# Patient Record
Sex: Female | Born: 1955
Health system: Southern US, Community
[De-identification: ages and names within clinical notes are randomized; demographics above are authoritative.]

## PROBLEM LIST (undated history)

## (undated) DIAGNOSIS — N920 Excessive and frequent menstruation with regular cycle: Secondary | ICD-10-CM

## (undated) DIAGNOSIS — I493 Ventricular premature depolarization: Secondary | ICD-10-CM

## (undated) DIAGNOSIS — E079 Disorder of thyroid, unspecified: Secondary | ICD-10-CM

## (undated) DIAGNOSIS — R000 Tachycardia, unspecified: Secondary | ICD-10-CM

## (undated) DIAGNOSIS — J189 Pneumonia, unspecified organism: Secondary | ICD-10-CM

## (undated) DIAGNOSIS — R4701 Aphasia: Secondary | ICD-10-CM

## (undated) DIAGNOSIS — J302 Other seasonal allergic rhinitis: Secondary | ICD-10-CM

## (undated) DIAGNOSIS — N951 Menopausal and female climacteric states: Secondary | ICD-10-CM

## (undated) DIAGNOSIS — G4700 Insomnia, unspecified: Secondary | ICD-10-CM

## (undated) DIAGNOSIS — I10 Essential (primary) hypertension: Secondary | ICD-10-CM

## (undated) HISTORY — DX: Tachycardia, unspecified: R00.0

## (undated) HISTORY — DX: Excessive and frequent menstruation with regular cycle: N92.0

## (undated) HISTORY — DX: Insomnia, unspecified: G47.00

## (undated) HISTORY — DX: Ventricular premature depolarization: I49.3

## (undated) HISTORY — DX: Pneumonia, unspecified organism: J18.9

## (undated) HISTORY — DX: Essential (primary) hypertension: I10

## (undated) HISTORY — DX: Menopausal and female climacteric states: N95.1

## (undated) HISTORY — DX: Aphasia: R47.01

## (undated) HISTORY — DX: Other seasonal allergic rhinitis: J30.2

## (undated) HISTORY — DX: Disorder of thyroid, unspecified: E07.9

---

## 2003-09-10 HISTORY — PX: OTHER SURGICAL HISTORY: SHX169

## 2005-02-09 HISTORY — PX: DILATION AND CURETTAGE OF UTERUS: SHX78

## 2005-11-16 ENCOUNTER — Other Ambulatory Visit: Payer: Self-pay

## 2005-11-19 ENCOUNTER — Ambulatory Visit: Payer: Self-pay

## 2006-09-14 LAB — HM PAP SMEAR

## 2008-08-23 ENCOUNTER — Encounter (HOSPITAL_COMMUNITY): Admission: RE | Admit: 2008-08-23 | Discharge: 2008-10-11 | Payer: Self-pay | Admitting: Internal Medicine

## 2013-10-24 ENCOUNTER — Emergency Department: Payer: Self-pay | Admitting: Emergency Medicine

## 2014-05-02 DIAGNOSIS — E0590 Thyrotoxicosis, unspecified without thyrotoxic crisis or storm: Secondary | ICD-10-CM | POA: Insufficient documentation

## 2014-05-02 DIAGNOSIS — F17200 Nicotine dependence, unspecified, uncomplicated: Secondary | ICD-10-CM | POA: Insufficient documentation

## 2014-07-18 ENCOUNTER — Other Ambulatory Visit: Payer: Self-pay

## 2014-07-18 MED ORDER — VERAPAMIL HCL 240 MG (CO) PO TB24: 240.0000 mg | ORAL_TABLET | Freq: Every day | ORAL | Status: DC

## 2014-07-19 ENCOUNTER — Telehealth: Payer: Self-pay | Admitting: Unknown Physician Specialty

## 2014-07-19 MED ORDER — VERAPAMIL HCL ER 240 MG PO TBCR: 240.0000 mg | EXTENDED_RELEASE_TABLET | Freq: Every day | ORAL | Status: DC

## 2014-07-19 NOTE — Telephone Encounter (Signed)
[facility] called and stated that Cozera HS has been discontinued, pharm would like to know if a substitute med can be called in. Please call the pharm about order # [id number] @ [phone]. Thanks.

## 2014-07-19 NOTE — Telephone Encounter (Signed)
I wrote a new order and sent it.  Please call the pharmacy and let them know

## 2014-10-01 ENCOUNTER — Other Ambulatory Visit: Payer: Self-pay | Admitting: Unknown Physician Specialty

## 2014-12-24 ENCOUNTER — Other Ambulatory Visit: Payer: Self-pay

## 2014-12-24 MED ORDER — ESZOPICLONE 3 MG PO TABS: 3.0000 mg | ORAL_TABLET | Freq: Every day | ORAL | Status: DC

## 2014-12-24 NOTE — Telephone Encounter (Signed)
Jasmine Camacho refilled the prescription and it was printed so I got her to sign it and it was faxed to the pharmacy.

## 2014-12-24 NOTE — Telephone Encounter (Signed)
Patient was last seen 05/11/14, practice partner number is [id number], and pharmacy is [facility].

## 2015-01-09 ENCOUNTER — Other Ambulatory Visit: Payer: Self-pay

## 2015-01-09 MED ORDER — VERAPAMIL HCL ER 240 MG PO TBCR: 240.0000 mg | EXTENDED_RELEASE_TABLET | Freq: Every day | ORAL | Status: DC

## 2015-01-09 NOTE — Telephone Encounter (Signed)
Practice partner number is [phone] and pharmacy is [facility].

## 2015-04-11 DIAGNOSIS — I493 Ventricular premature depolarization: Secondary | ICD-10-CM

## 2015-04-11 DIAGNOSIS — R4701 Aphasia: Secondary | ICD-10-CM | POA: Insufficient documentation

## 2015-04-11 DIAGNOSIS — R000 Tachycardia, unspecified: Secondary | ICD-10-CM | POA: Insufficient documentation

## 2015-04-11 DIAGNOSIS — I10 Essential (primary) hypertension: Secondary | ICD-10-CM | POA: Insufficient documentation

## 2015-04-11 DIAGNOSIS — G4700 Insomnia, unspecified: Secondary | ICD-10-CM

## 2015-04-11 DIAGNOSIS — E0590 Thyrotoxicosis, unspecified without thyrotoxic crisis or storm: Secondary | ICD-10-CM

## 2015-04-11 DIAGNOSIS — J302 Other seasonal allergic rhinitis: Secondary | ICD-10-CM

## 2015-04-11 DIAGNOSIS — N951 Menopausal and female climacteric states: Secondary | ICD-10-CM

## 2015-04-16 ENCOUNTER — Encounter: Payer: Self-pay | Admitting: Unknown Physician Specialty

## 2015-04-16 ENCOUNTER — Ambulatory Visit (INDEPENDENT_AMBULATORY_CARE_PROVIDER_SITE_OTHER): Payer: BLUE CROSS/BLUE SHIELD | Admitting: Unknown Physician Specialty

## 2015-04-16 VITALS — BP 169/85 | HR 51 | Temp 97.9°F | Ht 63.8 in | Wt 178.2 lb

## 2015-04-16 DIAGNOSIS — Z0000 Encounter for general adult medical examination without abnormal findings: Secondary | ICD-10-CM

## 2015-04-16 DIAGNOSIS — J302 Other seasonal allergic rhinitis: Secondary | ICD-10-CM

## 2015-04-16 DIAGNOSIS — I10 Essential (primary) hypertension: Secondary | ICD-10-CM

## 2015-04-16 DIAGNOSIS — E0590 Thyrotoxicosis, unspecified without thyrotoxic crisis or storm: Secondary | ICD-10-CM

## 2015-04-16 MED ORDER — ALISKIREN FUMARATE 300 MG PO TABS: 300.0000 mg | ORAL_TABLET | Freq: Every day | ORAL | Status: DC

## 2015-04-16 MED ORDER — VERAPAMIL HCL ER 240 MG PO TBCR: 240.0000 mg | EXTENDED_RELEASE_TABLET | Freq: Every day | ORAL | Status: DC

## 2015-04-16 MED ORDER — ESZOPICLONE 3 MG PO TABS: 3.0000 mg | ORAL_TABLET | Freq: Every day | ORAL | Status: DC

## 2015-04-16 NOTE — Assessment & Plan Note (Signed)
Check TSH, continue with f/u Dr. Tedd Sias

## 2015-04-16 NOTE — Assessment & Plan Note (Signed)
-   Continue Flonase  °

## 2015-04-16 NOTE — Progress Notes (Signed)
BP 169/85 mmHg  Pulse 51  Temp(Src) 97.9 F (36.6 C)  Ht 5' 3.8" (1.621 m)  Wt 178 lb 3.2 oz (80.831 kg)  BMI 30.76 kg/m2  SpO2 97%  LMP  (LMP Unknown)   Subjective:    Patient ID: Jasmine Camacho, female    DOB: 09-21-1955, 60 y.o.   MRN: [id number]  HPI: Jasmine Camacho is a 60 y.o. female  Chief Complaint  Patient presents with  . Medication Problem    pt states her verapmil used to be a 24 hr tablet but states it is now a 12 hr tablet   Hypertension States she took a decongestant in the AM Using medications without difficulty Average home BP 140/80  No problems or lightheadedness No chest pain with exertion or shortness of breath No Edema  Sinusitis This is a recurrent problem. Episode onset: 3 months. The problem has been waxing and waning since onset. There has been no fever. She is experiencing no pain. Associated symptoms include congestion. Pertinent negatives include no chills, coughing, diaphoresis, ear pain, headaches, hoarse voice, neck pain, shortness of breath, sinus pressure, sneezing, sore throat or swollen glands.  She is taking OTC Flonase  Insomnia Long term problem.  ? Related to history of aneurisms.    Hyperthyroid F/u with Dr. Tedd Sias  Relevant past medical, surgical, family and social history reviewed and updated as indicated. Interim medical history since our last visit reviewed. Allergies and medications reviewed and updated.  Review of Systems  Constitutional: Negative for chills and diaphoresis.  HENT: Positive for congestion. Negative for ear pain, hoarse voice, sinus pressure, sneezing and sore throat.   Respiratory: Negative for cough and shortness of breath.   Gastrointestinal:       Husband is concerned about that she doesn't eat much and can't lose weight.  He states her stomach is "hard as a rock."  Musculoskeletal: Negative for neck pain.  Skin: Positive for rash.       Pt with some itchy patches on skin.  She has been a  dermatologist  Neurological: Negative for headaches.    Per HPI unless specifically indicated above     Objective:    BP 169/85 mmHg  Pulse 51  Temp(Src) 97.9 F (36.6 C)  Ht 5' 3.8" (1.621 m)  Wt 178 lb 3.2 oz (80.831 kg)  BMI 30.76 kg/m2  SpO2 97%  LMP  (LMP Unknown)  Wt Readings from Last 3 Encounters:  04/16/15 178 lb 3.2 oz (80.831 kg)  05/11/14 182 lb (82.555 kg)    Physical Exam  Constitutional: She is oriented to person, place, and time. She appears well-developed and well-nourished. No distress.  HENT:  Head: Normocephalic and atraumatic.  Eyes: Conjunctivae and lids are normal. Right eye exhibits no discharge. Left eye exhibits no discharge. No scleral icterus.  Neck: Normal range of motion. Neck supple. No JVD present. Carotid bruit is not present.  Cardiovascular: Normal rate, regular rhythm and normal heart sounds.   Pulmonary/Chest: Effort normal and breath sounds normal.  Abdominal: Soft. Normal appearance and bowel sounds are normal. She exhibits mass. She exhibits no distension. There is no splenomegaly or hepatomegaly. There is tenderness. There is rebound and guarding.  Musculoskeletal: Normal range of motion.  Neurological: She is alert and oriented to person, place, and time.  Skin: Skin is warm, dry and intact. No rash noted. No pallor.  Psychiatric: She has a normal mood and affect. Her behavior is normal. Judgment and thought content normal.  Results for orders placed or performed in visit on 04/11/15  HM PAP SMEAR  Result Value Ref Range   HM Pap smear from [facility]       Assessment & Plan:   Problem List Items Addressed This Visit      Unprioritized   Hyperthyroidism    Check TSH, continue with f/u Dr. Tedd Sias      Relevant Orders   TSH   Seasonal allergic rhinitis    Continue Flonase.        Hypertension - Primary    Stable with good numbers at home.  Took an OTC decongestant, continue present medications.        Relevant Medications    verapamil (CALAN-SR) 240 MG CR tablet   aliskiren (TEKTURNA) 300 MG tablet   Other Relevant Orders   Comprehensive metabolic panel   Lipid Panel w/o Chol/HDL Ratio    Other Visit Diagnoses    Routine general medical examination at a health care facility        Relevant Orders    HIV antibody    Hepatitis C antibody         Follow up plan: Return in about 6 months (around 10/17/2015).

## 2015-04-16 NOTE — Assessment & Plan Note (Signed)
Stable with good numbers at home.  Took an OTC decongestant, continue present medications.

## 2015-04-17 ENCOUNTER — Encounter: Payer: Self-pay | Admitting: Unknown Physician Specialty

## 2015-04-17 LAB — LIPID PANEL W/O CHOL/HDL RATIO
Cholesterol, Total: 193 mg/dL (ref 100–199)
HDL: 40 mg/dL (ref >39)
LDL Calculated: 116 mg/dL — ABNORMAL HIGH (ref 0–99)
TRIGLYCERIDES: 183 mg/dL — AB (ref 0–149)
VLDL Cholesterol Cal: 37 mg/dL (ref 5–40)

## 2015-04-17 LAB — COMPREHENSIVE METABOLIC PANEL
ALBUMIN: 4.3 g/dL (ref 3.5–5.5)
ALK PHOS: 94 IU/L (ref 39–117)
ALT: 16 IU/L (ref 0–32)
AST: 14 IU/L (ref 0–40)
Albumin/Globulin Ratio: 1.7 (ref 1.1–2.5)
BUN/Creatinine Ratio: 16 (ref 9–23)
BUN: 13 mg/dL (ref 6–24)
Bilirubin Total: <0.2 mg/dL (ref 0.0–1.2)
CO2: 22 mmol/L (ref 18–29)
CREATININE: 0.82 mg/dL (ref 0.57–1.00)
Calcium: 9.6 mg/dL (ref 8.7–10.2)
Chloride: 101 mmol/L (ref 96–106)
GFR calc Af Amer: 91 mL/min/{1.73_m2} (ref >59)
GFR calc non Af Amer: 79 mL/min/{1.73_m2} (ref >59)
GLUCOSE: 89 mg/dL (ref 65–99)
Globulin, Total: 2.6 g/dL (ref 1.5–4.5)
Potassium: 5 mmol/L (ref 3.5–5.2)
Sodium: 140 mmol/L (ref 134–144)
Total Protein: 6.9 g/dL (ref 6.0–8.5)

## 2015-04-17 LAB — TSH: TSH: 1.17 u[IU]/mL (ref 0.450–4.500)

## 2015-04-17 LAB — HIV ANTIBODY (ROUTINE TESTING W REFLEX): HIV Screen 4th Generation wRfx: NONREACTIVE

## 2015-04-17 LAB — HEPATITIS C ANTIBODY: Hep C Virus Ab: <0.1 s/co ratio (ref 0.0–0.9)

## 2015-04-17 NOTE — Progress Notes (Signed)
Quick Note:  Normal labs. Patient notified by letter. ______ 

## 2015-07-05 ENCOUNTER — Other Ambulatory Visit: Payer: Self-pay | Admitting: Unknown Physician Specialty

## 2015-07-09 ENCOUNTER — Telehealth: Payer: Self-pay

## 2015-07-09 NOTE — Telephone Encounter (Signed)
Faxed rx for eszopiclone to pharmacy.

## 2015-08-14 ENCOUNTER — Telehealth: Payer: Self-pay | Admitting: Unknown Physician Specialty

## 2015-08-14 MED ORDER — VERAPAMIL HCL ER 240 MG PO TBCR: 240.0000 mg | EXTENDED_RELEASE_TABLET | Freq: Every day | ORAL | Status: DC

## 2015-08-14 NOTE — Telephone Encounter (Signed)
Routing to provider. Patient was seen in March and has appointment in September. Do I need to call and DC medication from previous pharmacy?

## 2015-08-14 NOTE — Telephone Encounter (Signed)
Pt's husband came by, insurance has changed, pt needs a refill on Verapamil sent to [facility] in [place]. Thanks.

## 2015-08-14 NOTE — Telephone Encounter (Signed)
No need.  But usually the pharmacy will transfer the prescription

## 2015-09-23 ENCOUNTER — Other Ambulatory Visit: Payer: Self-pay | Admitting: Unknown Physician Specialty

## 2015-09-23 NOTE — Telephone Encounter (Signed)
Pt needs refill on Eszopiclone 3 MG TABS sent to [facility] mail order pharmacy phone [phone] fax [phone].

## 2015-09-23 NOTE — Telephone Encounter (Signed)
Routing to provider. Pharmacy changed in chart.  

## 2015-09-24 MED ORDER — ESZOPICLONE 3 MG PO TABS: 3.0000 mg | ORAL_TABLET | Freq: Every day | ORAL | 3 refills | Status: DC

## 2015-09-24 NOTE — Telephone Encounter (Signed)
Rx was printed so I faxed it to [facility].

## 2015-10-29 ENCOUNTER — Ambulatory Visit (INDEPENDENT_AMBULATORY_CARE_PROVIDER_SITE_OTHER): Payer: Managed Care, Other (non HMO) | Admitting: Unknown Physician Specialty

## 2015-10-29 ENCOUNTER — Encounter: Payer: Self-pay | Admitting: Unknown Physician Specialty

## 2015-10-29 DIAGNOSIS — G4700 Insomnia, unspecified: Secondary | ICD-10-CM

## 2015-10-29 DIAGNOSIS — I10 Essential (primary) hypertension: Secondary | ICD-10-CM

## 2015-10-29 MED ORDER — ESZOPICLONE 3 MG PO TABS: 3.0000 mg | ORAL_TABLET | Freq: Every day | ORAL | 3 refills | Status: DC

## 2015-10-29 MED ORDER — ALISKIREN FUMARATE 300 MG PO TABS: 300.0000 mg | ORAL_TABLET | Freq: Every day | ORAL | 1 refills | Status: DC

## 2015-10-29 MED ORDER — VERAPAMIL HCL ER 240 MG PO TBCR: 240.0000 mg | EXTENDED_RELEASE_TABLET | Freq: Every day | ORAL | 1 refills | Status: DC

## 2015-10-29 MED ORDER — HYDROCHLOROTHIAZIDE 25 MG PO TABS: 25.0000 mg | ORAL_TABLET | Freq: Every day | ORAL | 2 refills | Status: DC

## 2015-10-29 NOTE — Progress Notes (Signed)
BP (!) 144/85 (BP Location: Left Arm, Cuff Size: Large)   Pulse (!) 108   Temp 98.6 F (37 C)   Ht 5\' 4"  (1.626 m) Comment: pt had shoes on  Wt 172 lb 11.2 oz (78.3 kg) Comment: pt had shoes on  LMP  (LMP Unknown)   SpO2 97%   BMI 29.64 kg/m    Subjective:    Patient ID: Jasmine Camacho, female    DOB: 30-Jul-1955, 60 y.o.   MRN: [id number]  HPI: Jasmine Camacho is a 60 y.o. female   Chief Complaint  Patient presents with  . Hypertension  . Insomnia    Hypertension Checks BP at home occasionally, reports results range from 120's/ 80's to 150's/100's. Taking medications at instructed.  Feels she would now benefit from a fluid pill, after trying them once before and not liking them. No edema  Insomnia After several different management approaches, including environmental and pharmacological, she is currently  satisfied with generic lunesta.  Relevant past medical, surgical, family and social history reviewed and updated as indicated. Interim medical history since our last visit reviewed. Allergies and medications reviewed and updated.  Review of Systems  Respiratory: Negative for shortness of breath.   Cardiovascular: Negative.   Neurological: Negative for dizziness and headaches.    Per HPI unless specifically indicated above     Objective:    BP (!) 144/85 (BP Location: Left Arm, Cuff Size: Large)   Pulse (!) 108   Temp 98.6 F (37 C)   Ht 5\' 4"  (1.626 m) Comment: pt had shoes on  Wt 172 lb 11.2 oz (78.3 kg) Comment: pt had shoes on  LMP  (LMP Unknown)   SpO2 97%   BMI 29.64 kg/m   Wt Readings from Last 3 Encounters:  10/29/15 172 lb 11.2 oz (78.3 kg)  04/16/15 178 lb 3.2 oz (80.8 kg)  05/11/14 182 lb (82.6 kg)    Physical Exam  Constitutional: She appears well-developed and well-nourished. No distress.  Cardiovascular: Normal rate and regular rhythm.   Pulmonary/Chest: Effort normal and breath sounds normal.  Psychiatric: She has a normal mood and  affect. Her behavior is normal. Judgment and thought content normal.    Results for orders placed or performed in visit on 04/16/15  Comprehensive metabolic panel  Result Value Ref Range   Glucose 89 65 - 99 mg/dL   BUN 13 6 - 24 mg/dL   Creatinine, Ser 4.090.82 0.57 - 1.00 mg/dL   GFR calc non Af Amer 79 >59 mL/min/1.73   GFR calc Af Amer 91 >59 mL/min/1.73   BUN/Creatinine Ratio 16 9 - 23   Sodium 140 134 - 144 mmol/L   Potassium 5.0 3.5 - 5.2 mmol/L   Chloride 101 96 - 106 mmol/L   CO2 22 18 - 29 mmol/L   Calcium 9.6 8.7 - 10.2 mg/dL   Total Protein 6.9 6.0 - 8.5 g/dL   Albumin 4.3 3.5 - 5.5 g/dL   Globulin, Total 2.6 1.5 - 4.5 g/dL   Albumin/Globulin Ratio 1.7 1.1 - 2.5   Bilirubin Total <0.2 0.0 - 1.2 mg/dL   Alkaline Phosphatase 94 39 - 117 IU/L   AST 14 0 - 40 IU/L   ALT 16 0 - 32 IU/L  Lipid Panel w/o Chol/HDL Ratio  Result Value Ref Range   Cholesterol, Total 193 100 - 199 mg/dL   Triglycerides 811183 (H) 0 - 149 mg/dL   HDL 40 >91>39 mg/dL   VLDL Cholesterol  Cal 37 5 - 40 mg/dL   LDL Calculated 403 (H) 0 - 99 mg/dL  TSH  Result Value Ref Range   TSH 1.170 0.450 - 4.500 uIU/mL  HIV antibody  Result Value Ref Range   HIV Screen 4th Generation wRfx Non Reactive Non Reactive  Hepatitis C antibody  Result Value Ref Range   Hep C Virus Ab <0.1 0.0 - 0.9 s/co ratio      Assessment & Plan:   Problem List Items Addressed This Visit      Unprioritized   Hypertension    Continue current medications. Restart Hydrochlorothiazide for occasional use.  Rash pt stated it caused is more eczema      Relevant Medications   aliskiren (TEKTURNA) 300 MG tablet   verapamil (CALAN-SR) 240 MG CR tablet   hydrochlorothiazide (HYDRODIURIL) 25 MG tablet   Insomnia    Stable, continue current medications.        Other Visit Diagnoses   None.      Follow up plan: Return in about 6 months (around 04/27/2016).

## 2015-10-29 NOTE — Assessment & Plan Note (Addendum)
Continue current medications. Restart Hydrochlorothiazide for occasional use.  Rash pt stated it caused is more eczema

## 2015-10-29 NOTE — Assessment & Plan Note (Signed)
Stable, continue current medications.  

## 2015-11-01 ENCOUNTER — Telehealth: Payer: Self-pay

## 2015-11-01 MED ORDER — HYDROCHLOROTHIAZIDE 25 MG PO TABS: 25.0000 mg | ORAL_TABLET | Freq: Every day | ORAL | 0 refills | Status: DC

## 2015-11-01 NOTE — Telephone Encounter (Signed)
Pharmacy sent a fax wanting to know if we can change hydrochlorothiazide rx to a 90 day supply due to insurance. Pharmacy is [facility].

## 2016-01-20 ENCOUNTER — Other Ambulatory Visit: Payer: Self-pay | Admitting: Unknown Physician Specialty

## 2016-01-20 NOTE — Telephone Encounter (Signed)
Your patient 

## 2016-02-28 ENCOUNTER — Telehealth: Payer: Self-pay | Admitting: Unknown Physician Specialty

## 2016-02-28 MED ORDER — AZITHROMYCIN 250 MG PO TABS: ORAL_TABLET | ORAL | 0 refills | Status: DC

## 2016-02-28 NOTE — Telephone Encounter (Signed)
Called and let patient know that RX was sent in for her.

## 2016-02-28 NOTE — Telephone Encounter (Signed)
Pt would like something sent to [facility] for a sinus infection. Offered appt but pt declined.

## 2016-02-28 NOTE — Telephone Encounter (Signed)
Called and spoke to patient. She stated she has cough, sore throat, sinus pressure, sinus congestion, bloody phlegm, and some fever. States she has had a z-pak in the past and it works for her. She states she has had these symptoms for about a week now and would like something sent to [facility].

## 2016-03-14 ENCOUNTER — Other Ambulatory Visit: Payer: Self-pay | Admitting: Unknown Physician Specialty

## 2016-03-18 ENCOUNTER — Telehealth: Payer: Self-pay

## 2016-03-18 MED ORDER — ESZOPICLONE 3 MG PO TABS
3.0000 mg | ORAL_TABLET | Freq: Every day | ORAL | 3 refills | Status: DC
Start: 2016-03-18 — End: 2016-06-22

## 2016-03-18 NOTE — Telephone Encounter (Signed)
RX faxed to [facility].

## 2016-03-18 NOTE — Telephone Encounter (Signed)
done

## 2016-03-18 NOTE — Telephone Encounter (Signed)
Received a refill request for Eszopiclone from [employer]. According to chart, this medication should not be due yet. I called the pharmacy and spoke with [facility]. She stated that they did not receive a prescription for this medication in September. She stated that since this medication is controlled, it is only good for 6 months. So we should only write for #90 with 1 refill. She stated that it was last filled in November. Can we write a new prescription for this medication and I will fax it to [employer]? (I asked Trula Ore if I could fax and she said yes with a cover sheet and demographics).

## 2016-04-17 ENCOUNTER — Other Ambulatory Visit: Payer: Self-pay | Admitting: Unknown Physician Specialty

## 2016-04-27 ENCOUNTER — Encounter: Payer: Managed Care, Other (non HMO) | Admitting: Unknown Physician Specialty

## 2016-04-28 ENCOUNTER — Ambulatory Visit (INDEPENDENT_AMBULATORY_CARE_PROVIDER_SITE_OTHER): Payer: Managed Care, Other (non HMO) | Admitting: Unknown Physician Specialty

## 2016-04-28 ENCOUNTER — Encounter: Payer: Self-pay | Admitting: Unknown Physician Specialty

## 2016-04-28 VITALS — BP 136/81 | HR 45 | Temp 98.4°F | Ht 64.0 in | Wt 174.4 lb

## 2016-04-28 DIAGNOSIS — I10 Essential (primary) hypertension: Secondary | ICD-10-CM

## 2016-04-28 DIAGNOSIS — F17200 Nicotine dependence, unspecified, uncomplicated: Secondary | ICD-10-CM

## 2016-04-28 DIAGNOSIS — F5101 Primary insomnia: Secondary | ICD-10-CM | POA: Diagnosis not present

## 2016-04-28 DIAGNOSIS — Z8679 Personal history of other diseases of the circulatory system: Secondary | ICD-10-CM | POA: Diagnosis not present

## 2016-04-28 DIAGNOSIS — R4701 Aphasia: Secondary | ICD-10-CM | POA: Diagnosis not present

## 2016-04-28 DIAGNOSIS — E0590 Thyrotoxicosis, unspecified without thyrotoxic crisis or storm: Secondary | ICD-10-CM

## 2016-04-28 NOTE — Assessment & Plan Note (Signed)
Not interested in quitting.  Refusing low dose CT.

## 2016-04-28 NOTE — Assessment & Plan Note (Signed)
Eszopiclone is the only thing that has worked.  She has tried Ambien and [employer]

## 2016-04-28 NOTE — Assessment & Plan Note (Signed)
Also seeing Dr. Tedd Sias for this

## 2016-04-28 NOTE — Progress Notes (Signed)
BP 136/81 (BP Location: Left Arm, Cuff Size: Large)   Pulse (!) 45   Temp 98.4 F (36.9 C)   Ht 5\' 4"  (1.626 m)   Wt 174 lb 6.4 oz (79.1 kg)   LMP  (LMP Unknown)   SpO2 98%   BMI 29.94 kg/m    Subjective:    Patient ID: Jasmine Camacho, female    DOB: 04-13-1955, 61 y.o.   MRN: [id number]  HPI: Jasmine Camacho is a 62 y.o. female  Chief Complaint  Patient presents with  . Hyperlipidemia  . Hypertension  . Insomnia   Hypertension Using medications without difficulty Average home BPs   No problems or lightheadedness No chest pain with exertion or shortness of breath No Edema  Insomnia Pt has tried Ambien and other medications from this.  Likely a result of there history of brain injury.   Relevant past medical, surgical, family and social history reviewed and updated as indicated. Interim medical history since our last visit reviewed. Allergies and medications reviewed and updated.  Tobacco Smokes 1/2 to 1 ppd for 40 years.  Shared decision making for low dose screening CT    Hypothyroid Seeing Dr. Tedd Sias  Review of Systems  Per HPI unless specifically indicated above     Objective:    BP 136/81 (BP Location: Left Arm, Cuff Size: Large)   Pulse (!) 45   Temp 98.4 F (36.9 C)   Ht 5\' 4"  (1.626 m)   Wt 174 lb 6.4 oz (79.1 kg)   LMP  (LMP Unknown)   SpO2 98%   BMI 29.94 kg/m   Wt Readings from Last 3 Encounters:  04/28/16 174 lb 6.4 oz (79.1 kg)  10/29/15 172 lb 11.2 oz (78.3 kg)  04/16/15 178 lb 3.2 oz (80.8 kg)    Physical Exam  Constitutional: She is oriented to person, place, and time. She appears well-developed and well-nourished. No distress.  HENT:  Head: Normocephalic and atraumatic.  Eyes: Conjunctivae and lids are normal. Right eye exhibits no discharge. Left eye exhibits no discharge. No scleral icterus.  Neck: Normal range of motion. Neck supple. No JVD present. Carotid bruit is not present.  Cardiovascular: Normal rate, regular rhythm and  normal heart sounds.   Pulmonary/Chest: Effort normal and breath sounds normal.  Abdominal: Normal appearance. There is no splenomegaly or hepatomegaly.  Musculoskeletal: Normal range of motion.  Neurological: She is alert and oriented to person, place, and time.  Skin: Skin is warm, dry and intact. No rash noted. No pallor.  Psychiatric: She has a normal mood and affect. Her behavior is normal. Judgment and thought content normal.     Assessment & Plan:   Problem List Items Addressed This Visit      Unprioritized   Aphasia - Primary    Stable without changes      H/O spont intraventricular hemorrhage due to cerebral aneurysm   Hypertension    Stable, continue present medications.        Relevant Orders   Comprehensive metabolic panel   Lipid Panel w/o Chol/HDL Ratio   Hyperthyroidism    Also seeing Dr. Tedd Sias for this      Insomnia    Eszopiclone is the only thing that has worked.  She has tried Ambien and Sonata      Tobacco dependence    Not interested in quitting.  Refusing low dose CT.            Follow up plan: Return in  about 6 months (around 10/29/2016).

## 2016-04-28 NOTE — Assessment & Plan Note (Signed)
Stable without changes.

## 2016-04-28 NOTE — Assessment & Plan Note (Signed)
Stable, continue present medications.   

## 2016-04-29 LAB — LIPID PANEL W/O CHOL/HDL RATIO
Cholesterol, Total: 193 mg/dL (ref 100–199)
HDL: 43 mg/dL (ref >39)
LDL Calculated: 123 mg/dL — ABNORMAL HIGH (ref 0–99)
Triglycerides: 136 mg/dL (ref 0–149)
VLDL Cholesterol Cal: 27 mg/dL (ref 5–40)

## 2016-04-29 LAB — COMPREHENSIVE METABOLIC PANEL
A/G RATIO: 1.4 (ref 1.2–2.2)
ALBUMIN: 4.3 g/dL (ref 3.5–5.5)
ALT: 18 IU/L (ref 0–32)
AST: 15 IU/L (ref 0–40)
Alkaline Phosphatase: 91 IU/L (ref 39–117)
BUN / CREAT RATIO: 15 (ref 9–23)
BUN: 15 mg/dL (ref 6–24)
Bilirubin Total: <0.2 mg/dL (ref 0.0–1.2)
CALCIUM: 10.2 mg/dL (ref 8.7–10.2)
CO2: 23 mmol/L (ref 18–29)
Chloride: 103 mmol/L (ref 96–106)
Creatinine, Ser: 1.01 mg/dL — ABNORMAL HIGH (ref 0.57–1.00)
GFR, EST AFRICAN AMERICAN: 70 mL/min/{1.73_m2} (ref >59)
GFR, EST NON AFRICAN AMERICAN: 61 mL/min/{1.73_m2} (ref >59)
GLOBULIN, TOTAL: 3 g/dL (ref 1.5–4.5)
Glucose: 93 mg/dL (ref 65–99)
POTASSIUM: 4.1 mmol/L (ref 3.5–5.2)
SODIUM: 141 mmol/L (ref 134–144)
TOTAL PROTEIN: 7.3 g/dL (ref 6.0–8.5)

## 2016-06-19 ENCOUNTER — Other Ambulatory Visit: Payer: Self-pay | Admitting: Unknown Physician Specialty

## 2016-06-19 NOTE — Telephone Encounter (Signed)
Patients husband dropped off a request for Jasmine Camacho to send patients script for Eszopiclone tab 3mg  to [facility]   Fax [phone] Phone [phone]  If any questions Christen Bame can be reached at [phone]  Thanks

## 2016-06-22 MED ORDER — ESZOPICLONE 3 MG PO TABS: 3.0000 mg | ORAL_TABLET | Freq: Every day | ORAL | 3 refills | Status: DC

## 2016-06-22 NOTE — Telephone Encounter (Signed)
Routing to provider  

## 2016-10-30 ENCOUNTER — Ambulatory Visit: Payer: Self-pay | Admitting: Unknown Physician Specialty

## 2016-11-02 ENCOUNTER — Ambulatory Visit (INDEPENDENT_AMBULATORY_CARE_PROVIDER_SITE_OTHER): Payer: BLUE CROSS/BLUE SHIELD | Admitting: Unknown Physician Specialty

## 2016-11-02 ENCOUNTER — Encounter: Payer: Self-pay | Admitting: Unknown Physician Specialty

## 2016-11-02 VITALS — BP 148/82 | HR 48 | Temp 98.7°F | Wt 169.0 lb

## 2016-11-02 DIAGNOSIS — F5101 Primary insomnia: Secondary | ICD-10-CM | POA: Diagnosis not present

## 2016-11-02 DIAGNOSIS — I493 Ventricular premature depolarization: Secondary | ICD-10-CM | POA: Diagnosis not present

## 2016-11-02 DIAGNOSIS — R9431 Abnormal electrocardiogram [ECG] [EKG]: Secondary | ICD-10-CM | POA: Insufficient documentation

## 2016-11-02 DIAGNOSIS — E0590 Thyrotoxicosis, unspecified without thyrotoxic crisis or storm: Secondary | ICD-10-CM | POA: Diagnosis not present

## 2016-11-02 DIAGNOSIS — I10 Essential (primary) hypertension: Secondary | ICD-10-CM

## 2016-11-02 MED ORDER — VERAPAMIL HCL ER 240 MG PO TBCR: 240.0000 mg | EXTENDED_RELEASE_TABLET | Freq: Every day | ORAL | 1 refills | Status: DC

## 2016-11-02 MED ORDER — ESZOPICLONE 3 MG PO TABS: 3.0000 mg | ORAL_TABLET | Freq: Every day | ORAL | 3 refills | Status: DC

## 2016-11-02 MED ORDER — ALISKIREN FUMARATE 300 MG PO TABS: 300.0000 mg | ORAL_TABLET | Freq: Every day | ORAL | 1 refills | Status: DC

## 2016-11-02 NOTE — Assessment & Plan Note (Signed)
Per endocrine  

## 2016-11-02 NOTE — Assessment & Plan Note (Signed)
Stable, continue present medications.   

## 2016-11-02 NOTE — Assessment & Plan Note (Addendum)
EKG with ventricular bigeminy and abnormal EKG.  Refused cardiology appointment. Discussed with pt and husband that it may represent a silent MI.  Still refusing.

## 2016-11-02 NOTE — Progress Notes (Signed)
BP (!) 148/82 (BP Location: Left Arm, Cuff Size: Normal)   Pulse (!) 48   Temp 98.7 F (37.1 C)   Wt 169 lb (76.7 kg)   LMP  (LMP Unknown)   SpO2 98%   BMI 29.01 kg/m    Subjective:    Patient ID: Jasmine Camacho, female    DOB: 12/21/1955, 61 y.o.   MRN: [id number]  HPI: Jasmine Camacho is a 61 y.o. female  Chief Complaint  Patient presents with  . Hypertension  . Hypothyroidism  . Insomnia   Hypertension Tried to fluid pill and it didn't work out for her as it made her a lot different Average home BPs Not checking   No problems or lightheadedness No chest pain with exertion or shortness of breath No Edema  Insomnia Multiple medications tried and Eszopiclone the only thing that has worked.  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 Interim medical history since our last visit reviewed. Allergies and medications reviewed and updated.  Review of Systems  Per HPI unless specifically indicated above     Objective:    BP (!) 148/82 (BP Location: Left Arm, Cuff Size: Normal)   Pulse (!) 48   Temp 98.7 F (37.1 C)   Wt 169 lb (76.7 kg)   LMP  (LMP Unknown)   SpO2 98%   BMI 29.01 kg/m   Wt Readings from Last 3 Encounters:  11/02/16 169 lb (76.7 kg)  04/28/16 174 lb 6.4 oz (79.1 kg)  10/29/15 172 lb 11.2 oz (78.3 kg)    Physical Exam  Constitutional: She is oriented to person, place, and time. She appears well-developed and well-nourished. No distress.  HENT:  Head: Normocephalic and atraumatic.  Eyes: Conjunctivae and lids are normal. Right eye exhibits no discharge. Left eye exhibits no discharge. No scleral icterus.  Neck: Normal range of motion. Neck supple. No JVD present. Carotid bruit is not present.  Cardiovascular: Normal heart sounds.  A regularly irregular rhythm present.  Pulmonary/Chest: Effort normal  and breath sounds normal.  Abdominal: Normal appearance. There is no splenomegaly or hepatomegaly.  Musculoskeletal: Normal range of motion.  Neurological: She is alert and oriented to person, place, and time.  Skin: Skin is warm, dry and intact. No rash noted. No pallor.  Psychiatric: She has a normal mood and affect. Her behavior is normal. Judgment and thought content normal.   EKG with ventricular bigeminy.  STTW changes.  LVH  Results for orders placed or performed in visit on 04/28/16  Comprehensive metabolic panel  Result Value Ref Range   Glucose 93 65 - 99 mg/dL   BUN 15 6 - 24 mg/dL   Creatinine, Ser 7.82 (H) 0.57 - 1.00 mg/dL   GFR calc non Af Amer 61 >59 mL/min/1.73   GFR calc Af Amer 70 >59 mL/min/1.73   BUN/Creatinine Ratio 15 9 - 23   Sodium 141 134 - 144 mmol/L   Potassium 4.1 3.5 - 5.2 mmol/L   Chloride 103 96 - 106 mmol/L   CO2 23 18 - 29 mmol/L   Calcium 10.2 8.7 - 10.2 mg/dL   Total Protein 7.3 6.0 - 8.5 g/dL   Albumin 4.3 3.5 - 5.5 g/dL   Globulin, Total 3.0 1.5 - 4.5 g/dL   Albumin/Globulin Ratio 1.4 1.2 - 2.2   Bilirubin Total <0.2 0.0 -  1.2 mg/dL   Alkaline Phosphatase 91 39 - 117 IU/L   AST 15 0 - 40 IU/L   ALT 18 0 - 32 IU/L  Lipid Panel w/o Chol/HDL Ratio  Result Value Ref Range   Cholesterol, Total 193 100 - 199 mg/dL   Triglycerides 250 0 - 149 mg/dL   HDL 43 >03 mg/dL   VLDL Cholesterol Cal 27 5 - 40 mg/dL   LDL Calculated 704 (H) 0 - 99 mg/dL      Assessment & Plan:   Problem List Items Addressed This Visit      Unprioritized   Abnormal EKG    EKG with ventricular bigeminy and abnormal EKG.  Refused cardiology appointment. Discussed with pt and husband that it may represent a silent MI.  Still refusing.        Hypertension    Currently is presently on a maximum tolerated dose and not tolerating additions to her treatment      Relevant Medications   aliskiren (TEKTURNA) 300 MG tablet   verapamil (CALAN-SR) 240 MG CR tablet    Hyperthyroidism    Per endocrine      Insomnia    Stable, continue present medications.        Ventricular premature contractions - Primary   Relevant Medications   aliskiren (TEKTURNA) 300 MG tablet   verapamil (CALAN-SR) 240 MG CR tablet   Other Relevant Orders   EKG 12-Lead (Completed)       Follow up plan: Return in about 6 months (around 05/02/2017) for physical.

## 2016-11-02 NOTE — Assessment & Plan Note (Addendum)
Currently is presently on a maximum tolerated dose and not tolerating additions to her treatment

## 2016-11-11 ENCOUNTER — Telehealth: Payer: Self-pay | Admitting: Unknown Physician Specialty

## 2016-11-11 NOTE — Telephone Encounter (Signed)
[employer] called to inform CMA that patient medication for Danie Chandler was approved.   Please Advise.  Thank you

## 2016-11-11 NOTE — Telephone Encounter (Signed)
Keri, did you put this PA through? I don't think I did.

## 2016-11-11 NOTE — Telephone Encounter (Signed)
Unless it was before surgery I didn't, but she's not in my CoverMyMeds

## 2016-11-12 NOTE — Telephone Encounter (Signed)
Called pharmacy and spoke to the pharmacist Sherrilyn Rist. I let her know that the patient's insurance company called and informed us that the patient's tekurna was approved. Sherrilyn Rist states that the copay is $420.69 so they would be contacting the patient to see if she wants to pay that. Sherrilyn Rist thanked me for calling to let them know about the approval.

## 2016-11-16 ENCOUNTER — Other Ambulatory Visit: Payer: Self-pay | Admitting: Unknown Physician Specialty

## 2016-11-16 ENCOUNTER — Telehealth: Payer: Self-pay | Admitting: Unknown Physician Specialty

## 2016-11-17 NOTE — Telephone Encounter (Signed)
Faxed Eszopicione to pharmacy

## 2016-12-10 ENCOUNTER — Other Ambulatory Visit: Payer: Self-pay | Admitting: Unknown Physician Specialty

## 2016-12-10 NOTE — Telephone Encounter (Signed)
Please review for refill.  

## 2016-12-29 MED ORDER — VERAPAMIL HCL ER 240 MG PO TBCR: 240.0000 mg | EXTENDED_RELEASE_TABLET | Freq: Every day | ORAL | 1 refills | Status: DC

## 2016-12-29 NOTE — Telephone Encounter (Signed)
Cheryl's pt

## 2016-12-29 NOTE — Telephone Encounter (Signed)
Patient's husband stopped by office  to check on the refill of medication for wife. Pts husband states wife has 4 pills left on medication Pt.'s husband states medication was sent to [facility] pharmacy but has not received call from pharmacy in regards to medication being ready.    Medication: Verapamil Extended Release to [facility] pharmacy    Please Advise.  Thank you

## 2016-12-29 NOTE — Telephone Encounter (Signed)
Called and spoke to patient's husband. He states that he needs the medication sent to the local pharmacy because the mail order does not have it in stock and do not know when they will get it. Patient last seen 11/02/16 and has f/up 05/04/17.

## 2017-03-19 ENCOUNTER — Encounter: Payer: Self-pay | Admitting: Unknown Physician Specialty

## 2017-05-03 ENCOUNTER — Other Ambulatory Visit: Payer: Self-pay | Admitting: Unknown Physician Specialty

## 2017-05-04 ENCOUNTER — Encounter: Payer: BLUE CROSS/BLUE SHIELD | Admitting: Unknown Physician Specialty

## 2017-05-10 ENCOUNTER — Encounter: Payer: Self-pay | Admitting: Unknown Physician Specialty

## 2017-05-10 ENCOUNTER — Ambulatory Visit (INDEPENDENT_AMBULATORY_CARE_PROVIDER_SITE_OTHER): Payer: BLUE CROSS/BLUE SHIELD | Admitting: Unknown Physician Specialty

## 2017-05-10 DIAGNOSIS — I10 Essential (primary) hypertension: Secondary | ICD-10-CM | POA: Diagnosis not present

## 2017-05-10 DIAGNOSIS — F5101 Primary insomnia: Secondary | ICD-10-CM

## 2017-05-10 DIAGNOSIS — E0590 Thyrotoxicosis, unspecified without thyrotoxic crisis or storm: Secondary | ICD-10-CM | POA: Diagnosis not present

## 2017-05-10 DIAGNOSIS — F17200 Nicotine dependence, unspecified, uncomplicated: Secondary | ICD-10-CM | POA: Diagnosis not present

## 2017-05-10 MED ORDER — ALISKIREN FUMARATE 300 MG PO TABS: 300.0000 mg | ORAL_TABLET | Freq: Every day | ORAL | 1 refills | Status: DC

## 2017-05-10 MED ORDER — ESZOPICLONE 3 MG PO TABS: 3.0000 mg | ORAL_TABLET | Freq: Every day | ORAL | 1 refills | Status: DC

## 2017-05-10 MED ORDER — VERAPAMIL HCL ER 240 MG PO TBCR: 240.0000 mg | EXTENDED_RELEASE_TABLET | Freq: Every day | ORAL | 1 refills | Status: DC

## 2017-05-10 NOTE — Assessment & Plan Note (Signed)
Stable, continue present medications.   

## 2017-05-10 NOTE — Assessment & Plan Note (Signed)
Not ready to quit at this time.  Refusing low dose CT

## 2017-05-10 NOTE — Assessment & Plan Note (Signed)
Per Dr. Solum 

## 2017-05-10 NOTE — Progress Notes (Signed)
BP 122/86   Pulse 97   Temp 97.9 F (36.6 C) (Oral)   Ht 5' 3.8" (1.621 m)   Wt 173 lb 1.6 oz (78.5 kg)   LMP  (LMP Unknown)   SpO2 100%   BMI 29.90 kg/m    Subjective:    Patient ID: Jasmine Camacho, female    DOB: 1955-08-13, 62 y.o.   MRN: [id number]  HPI: Jasmine Camacho is a 62 y.o. female  Chief Complaint  Patient presents with  . Hypertension    pt states she does not want a physical today, just a follow up    Hypertension Using medications without difficulty Average home BPs Not checking   No problems or lightheadedness No chest pain with exertion or shortness of breath No Edema   Insomnia Taking Eszopiclone 3 mg for years.  Thought to be due to history of anerysm with hemorrhage.   Tobacco Smoke 1/2 ppd.  She feels she could quit if her husband quit.  Refusing low dose CT  Hyperthyroid Seeing Endocrine and monitored there.    Social History   Socioeconomic History  . Marital status: Married    Spouse name: Not on file  . Number of children: Not on file  . Years of education: Not on file  . Highest education level: Not on file  Occupational History  . Not on file  Social Needs  . Financial resource strain: Not on file  . Food insecurity:    Worry: Not on file    Inability: Not on file  . Transportation needs:    Medical: Not on file    Non-medical: Not on file  Tobacco Use  . Smoking status: Current Every Day Smoker    Packs/day: 0.50    Types: Cigarettes  . Smokeless tobacco: Never Used  Substance and Sexual Activity  . Alcohol use: No    Alcohol/week: 0.0 oz  . Drug use: No  . Sexual activity: Never  Lifestyle  . Physical activity:    Days per week: Not on file    Minutes per session: Not on file  . Stress: Not on file  Relationships  . Social connections:    Talks on phone: Not on file    Gets together: Not on file    Attends religious service: Not on file    Active member of club or organization: Not on file    Attends meetings  of clubs or organizations: Not on file    Relationship status: Not on file  . Intimate partner violence:    Fear of current or ex partner: Not on file    Emotionally abused: Not on file    Physically abused: Not on file    Forced sexual activity: Not on file  Other Topics Concern  . Not on file  Social History Narrative  . Not on file      Relevant past medical, surgical, family and social history reviewed and updated as indicated. Interim medical history since our last visit reviewed. Allergies and medications reviewed and updated.  Review of Systems  Constitutional: Negative.   HENT: Negative.   Eyes: Negative.   Respiratory: Negative.   Cardiovascular: Negative.   Gastrointestinal: Negative.   Endocrine: Negative.   Genitourinary: Negative.   Musculoskeletal: Negative.   Skin: Negative.   Allergic/Immunologic: Negative.   Neurological: Negative.   Hematological: Negative.   Psychiatric/Behavioral: Negative.     Per HPI unless specifically indicated above     Objective:  BP 122/86   Pulse 97   Temp 97.9 F (36.6 C) (Oral)   Ht 5' 3.8" (1.621 m)   Wt 173 lb 1.6 oz (78.5 kg)   LMP  (LMP Unknown)   SpO2 100%   BMI 29.90 kg/m   Wt Readings from Last 3 Encounters:  05/10/17 173 lb 1.6 oz (78.5 kg)  11/02/16 169 lb (76.7 kg)  04/28/16 174 lb 6.4 oz (79.1 kg)    Physical Exam  Constitutional: She is oriented to person, place, and time. She appears well-developed and well-nourished. No distress.  HENT:  Head: Normocephalic and atraumatic.  Eyes: Conjunctivae and lids are normal. Right eye exhibits no discharge. Left eye exhibits no discharge. No scleral icterus.  Neck: Normal range of motion. Neck supple. No JVD present. Carotid bruit is not present.  Cardiovascular: Normal rate, regular rhythm and normal heart sounds.  Pulmonary/Chest: Effort normal and breath sounds normal.  Abdominal: Normal appearance. There is no splenomegaly or hepatomegaly.    Musculoskeletal: Normal range of motion.  Neurological: She is alert and oriented to person, place, and time.  Skin: Skin is warm, dry and intact. No rash noted. No pallor.  Psychiatric: She has a normal mood and affect. Her behavior is normal. Judgment and thought content normal.    Results for orders placed or performed in visit on 04/28/16  Comprehensive metabolic panel  Result Value Ref Range   Glucose 93 65 - 99 mg/dL   BUN 15 6 - 24 mg/dL   Creatinine, Ser 0.25 (H) 0.57 - 1.00 mg/dL   GFR calc non Af Amer 61 >59 mL/min/1.73   GFR calc Af Amer 70 >59 mL/min/1.73   BUN/Creatinine Ratio 15 9 - 23   Sodium 141 134 - 144 mmol/L   Potassium 4.1 3.5 - 5.2 mmol/L   Chloride 103 96 - 106 mmol/L   CO2 23 18 - 29 mmol/L   Calcium 10.2 8.7 - 10.2 mg/dL   Total Protein 7.3 6.0 - 8.5 g/dL   Albumin 4.3 3.5 - 5.5 g/dL   Globulin, Total 3.0 1.5 - 4.5 g/dL   Albumin/Globulin Ratio 1.4 1.2 - 2.2   Bilirubin Total <0.2 0.0 - 1.2 mg/dL   Alkaline Phosphatase 91 39 - 117 IU/L   AST 15 0 - 40 IU/L   ALT 18 0 - 32 IU/L  Lipid Panel w/o Chol/HDL Ratio  Result Value Ref Range   Cholesterol, Total 193 100 - 199 mg/dL   Triglycerides 427 0 - 149 mg/dL   HDL 43 >06 mg/dL   VLDL Cholesterol Cal 27 5 - 40 mg/dL   LDL Calculated 237 (H) 0 - 99 mg/dL      Assessment & Plan:   Problem List Items Addressed This Visit      Unprioritized   Hypertension    Stable, continue present medications.        Relevant Medications   aliskiren (TEKTURNA) 300 MG tablet   verapamil (CALAN-SR) 240 MG CR tablet   Hyperthyroidism    Per Dr. Tedd Sias      Insomnia    Stable, continue present medications.        Tobacco dependence    Not ready to quit at this time.  Refusing low dose CT         Colonoscopy: refused Mammogram: refiused Pap smear: refused Low dose CT: refused  Follow up plan: Return in about 1 year (around 05/11/2018).

## 2017-05-11 ENCOUNTER — Other Ambulatory Visit: Payer: Self-pay | Admitting: Family Medicine

## 2017-05-11 NOTE — Telephone Encounter (Signed)
Eszopiclone refill Last Refill:05/10/17 #90 1 RF

## 2017-09-09 ENCOUNTER — Ambulatory Visit (INDEPENDENT_AMBULATORY_CARE_PROVIDER_SITE_OTHER): Payer: BLUE CROSS/BLUE SHIELD | Admitting: Family Medicine

## 2017-09-09 ENCOUNTER — Other Ambulatory Visit: Payer: Self-pay

## 2017-09-09 ENCOUNTER — Encounter: Payer: Self-pay | Admitting: Family Medicine

## 2017-09-09 VITALS — BP 134/84 | HR 96 | Temp 98.5°F | Ht 63.8 in | Wt 167.0 lb

## 2017-09-09 DIAGNOSIS — Z13220 Encounter for screening for lipoid disorders: Secondary | ICD-10-CM | POA: Diagnosis not present

## 2017-09-09 DIAGNOSIS — E0590 Thyrotoxicosis, unspecified without thyrotoxic crisis or storm: Secondary | ICD-10-CM | POA: Diagnosis not present

## 2017-09-09 DIAGNOSIS — I10 Essential (primary) hypertension: Secondary | ICD-10-CM

## 2017-09-09 DIAGNOSIS — F5101 Primary insomnia: Secondary | ICD-10-CM | POA: Diagnosis not present

## 2017-09-09 MED ORDER — ESZOPICLONE 3 MG PO TABS: 3.0000 mg | ORAL_TABLET | Freq: Every day | ORAL | 0 refills | Status: DC

## 2017-09-09 MED ORDER — ALISKIREN FUMARATE 300 MG PO TABS: 300.0000 mg | ORAL_TABLET | Freq: Every day | ORAL | 0 refills | Status: DC

## 2017-09-09 MED ORDER — VERAPAMIL HCL ER 240 MG PO TBCR: 240.0000 mg | EXTENDED_RELEASE_TABLET | Freq: Every day | ORAL | 0 refills | Status: DC

## 2017-09-09 NOTE — Assessment & Plan Note (Addendum)
Takes lunesta nightly. Discussed the dangers of taking nightly sedatives. She is not interested in cutting back on her medicine. Advised her to try cutting back and taking medicine only when needed. Call with any concerns.

## 2017-09-09 NOTE — Assessment & Plan Note (Signed)
Under good control on recheck. Continue current regimen. Call with any concerns. Refills given today.

## 2017-09-09 NOTE — Assessment & Plan Note (Signed)
Followed by Dr. Tedd Sias. Labs due with her in September. Normal last check. Continue to follow with endocrinology. Call with any concerns.

## 2017-09-09 NOTE — Progress Notes (Signed)
BP 134/84 (BP Location: Left Arm, Cuff Size: Normal)   Pulse 96   Temp 98.5 F (36.9 C) (Oral)   Ht 5' 3.8" (1.621 m)   Wt 167 lb (75.8 kg)   LMP  (LMP Unknown)   SpO2 98%   BMI 28.85 kg/m    Subjective:    Patient ID: Jasmine Camacho, female    DOB: [birth year], 62 y.o.   MRN: [id number]  HPI: Jasmine Camacho is a 62 y.o. female  Chief Complaint  Patient presents with  . Hypertension  . Insomnia   HYPERTENSION Hypertension status: stable  Satisfied with current treatment? yes Duration of hypertension: chronic BP monitoring frequency:  not checking BP medication side effects:  no Medication compliance: excellent compliance Previous BP meds: tekturna, verapamil Aspirin: no Recurrent headaches: no Visual changes: no Palpitations: no Dyspnea: no Chest pain: no Lower extremity edema: no Dizzy/lightheaded: no  INSOMNIA Duration: chronic Satisfied with sleep quality: yes Difficulty falling asleep: yes Difficulty staying asleep: no Waking a few hours after sleep onset: no Early morning awakenings: no Daytime hypersomnolence: no Wakes feeling refreshed: no Good sleep hygiene: no Apnea: no Snoring: no Depressed/anxious mood: no Recent stress: no Restless legs/nocturnal leg cramps: no Chronic pain/arthritis: no History of sleep study: no Treatments attempted: melatonin, uinsom, benadryl and ambien   Relevant past medical, surgical, family and social history reviewed and updated as indicated. Interim medical history since our last visit reviewed. Allergies and medications reviewed and updated.  Review of Systems  Constitutional: Negative.   Respiratory: Negative.   Cardiovascular: Negative.   Skin: Negative.   Psychiatric/Behavioral: Positive for sleep disturbance. Negative for agitation, behavioral problems, confusion, decreased concentration, dysphoric mood, hallucinations, self-injury and suicidal ideas. The patient is not nervous/anxious and is not  hyperactive.     Per HPI unless specifically indicated above     Objective:    BP 134/84 (BP Location: Left Arm, Cuff Size: Normal)   Pulse 96   Temp 98.5 F (36.9 C) (Oral)   Ht 5' 3.8" (1.621 m)   Wt 167 lb (75.8 kg)   LMP  (LMP Unknown)   SpO2 98%   BMI 28.85 kg/m   Wt Readings from Last 3 Encounters:  09/09/17 167 lb (75.8 kg)  05/10/17 173 lb 1.6 oz (78.5 kg)  11/02/16 169 lb (76.7 kg)    Physical Exam  Constitutional: She is oriented to person, place, and time. She appears well-developed and well-nourished. No distress.  HENT:  Head: Normocephalic and atraumatic.  Right Ear: Hearing normal.  Left Ear: Hearing normal.  Nose: Nose normal.  Eyes: Conjunctivae and lids are normal. Right eye exhibits no discharge. Left eye exhibits no discharge. No scleral icterus.  Cardiovascular: Normal rate, regular rhythm, normal heart sounds and intact distal pulses. Exam reveals no gallop and no friction rub.  No murmur heard. Pulmonary/Chest: Effort normal and breath sounds normal. No stridor. No respiratory distress. She has no wheezes. She has no rales. She exhibits no tenderness.  Musculoskeletal: Normal range of motion.  Neurological: She is alert and oriented to person, place, and time.  Skin: Skin is warm, dry and intact. Capillary refill takes less than 2 seconds. No rash noted. She is not diaphoretic. No erythema. No pallor.  Psychiatric: She has a normal mood and affect. Her speech is normal and behavior is normal. Judgment and thought content normal. Cognition and memory are normal.  Nursing note and vitals reviewed.   Results for orders placed or performed in  visit on 04/28/16  Comprehensive metabolic panel  Result Value Ref Range   Glucose 93 65 - 99 mg/dL   BUN 15 6 - 24 mg/dL   Creatinine, Ser 1.61 (H) 0.57 - 1.00 mg/dL   GFR calc non Af Amer 61 >59 mL/min/1.73   GFR calc Af Amer 70 >59 mL/min/1.73   BUN/Creatinine Ratio 15 9 - 23   Sodium 141 134 - 144 mmol/L     Potassium 4.1 3.5 - 5.2 mmol/L   Chloride 103 96 - 106 mmol/L   CO2 23 18 - 29 mmol/L   Calcium 10.2 8.7 - 10.2 mg/dL   Total Protein 7.3 6.0 - 8.5 g/dL   Albumin 4.3 3.5 - 5.5 g/dL   Globulin, Total 3.0 1.5 - 4.5 g/dL   Albumin/Globulin Ratio 1.4 1.2 - 2.2   Bilirubin Total <0.2 0.0 - 1.2 mg/dL   Alkaline Phosphatase 91 39 - 117 IU/L   AST 15 0 - 40 IU/L   ALT 18 0 - 32 IU/L  Lipid Panel w/o Chol/HDL Ratio  Result Value Ref Range   Cholesterol, Total 193 100 - 199 mg/dL   Triglycerides 096 0 - 149 mg/dL   HDL 43 >04 mg/dL   VLDL Cholesterol Cal 27 5 - 40 mg/dL   LDL Calculated 540 (H) 0 - 99 mg/dL      Assessment & Plan:   Problem List Items Addressed This Visit      Cardiovascular and Mediastinum   Hypertension - Primary    Under good control on recheck. Continue current regimen. Call with any concerns. Refills given today.      Relevant Medications   aliskiren (TEKTURNA) 300 MG tablet (Start on 12/10/2017)   verapamil (CALAN-SR) 240 MG CR tablet (Start on 12/10/2017)   Other Relevant Orders   Comprehensive metabolic panel   Microalbumin, Urine Waived     Endocrine   Hyperthyroidism    Followed by Dr. Tedd Sias. Labs due with her in September. Normal last check. Continue to follow with endocrinology. Call with any concerns.         Other   Insomnia    Takes lunesta nightly. Discussed the dangers of taking nightly sedatives. She is not interested in cutting back on her medicine. Advised her to try cutting back and taking medicine only when needed. Call with any concerns.        Other Visit Diagnoses    Screening for cholesterol level       Checking levels today. Call with any concerns.    Relevant Orders   Lipid Panel w/o Chol/HDL Ratio       Follow up plan: Return in about 6 months (around 03/12/2018) for Follow up.

## 2017-09-10 ENCOUNTER — Encounter: Payer: Self-pay | Admitting: Family Medicine

## 2017-09-10 LAB — COMPREHENSIVE METABOLIC PANEL
A/G RATIO: 1.7 (ref 1.2–2.2)
ALK PHOS: 101 IU/L (ref 39–117)
ALT: 14 IU/L (ref 0–32)
AST: 13 IU/L (ref 0–40)
Albumin: 4.5 g/dL (ref 3.6–4.8)
BUN/Creatinine Ratio: 16 (ref 12–28)
BUN: 16 mg/dL (ref 8–27)
CO2: 22 mmol/L (ref 20–29)
Calcium: 9.6 mg/dL (ref 8.7–10.3)
Chloride: 99 mmol/L (ref 96–106)
Creatinine, Ser: 0.97 mg/dL (ref 0.57–1.00)
GFR calc Af Amer: 73 mL/min/{1.73_m2} (ref >59)
GFR calc non Af Amer: 63 mL/min/{1.73_m2} (ref >59)
GLOBULIN, TOTAL: 2.6 g/dL (ref 1.5–4.5)
Glucose: 83 mg/dL (ref 65–99)
POTASSIUM: 4.2 mmol/L (ref 3.5–5.2)
SODIUM: 138 mmol/L (ref 134–144)
Total Protein: 7.1 g/dL (ref 6.0–8.5)

## 2017-09-10 LAB — LIPID PANEL W/O CHOL/HDL RATIO
CHOLESTEROL TOTAL: 198 mg/dL (ref 100–199)
HDL: 39 mg/dL — ABNORMAL LOW (ref >39)
LDL Calculated: 120 mg/dL — ABNORMAL HIGH (ref 0–99)
TRIGLYCERIDES: 196 mg/dL — AB (ref 0–149)
VLDL Cholesterol Cal: 39 mg/dL (ref 5–40)

## 2017-12-10 ENCOUNTER — Other Ambulatory Visit: Payer: Self-pay | Admitting: Family Medicine

## 2017-12-14 ENCOUNTER — Other Ambulatory Visit: Payer: Self-pay | Admitting: Family Medicine

## 2017-12-14 ENCOUNTER — Telehealth: Payer: Self-pay | Admitting: Family Medicine

## 2017-12-14 MED ORDER — VERAPAMIL HCL ER 240 MG PO TBCR: 240.0000 mg | EXTENDED_RELEASE_TABLET | Freq: Every day | ORAL | 1 refills | Status: DC

## 2017-12-14 MED ORDER — ESZOPICLONE 3 MG PO TABS: 3.0000 mg | ORAL_TABLET | Freq: Every day | ORAL | 0 refills | Status: DC

## 2017-12-14 NOTE — Telephone Encounter (Signed)
I do not give years supplies of medication, especially not for lunesta. She has enough medicine to make it to her follow up in February- I sent another refill to her pharmacy.

## 2017-12-14 NOTE — Telephone Encounter (Signed)
Patient's husband Jasmine Camacho states customer needs refill on Verapamil 240 MG CR tablet and Eszopiclone 3 MG tabs. [facility] and states that she usually gets 90 day prescriptions for 1 year.

## 2018-03-15 ENCOUNTER — Other Ambulatory Visit: Payer: Self-pay

## 2018-03-15 ENCOUNTER — Encounter: Payer: Self-pay | Admitting: Family Medicine

## 2018-03-15 ENCOUNTER — Ambulatory Visit (INDEPENDENT_AMBULATORY_CARE_PROVIDER_SITE_OTHER): Payer: BLUE CROSS/BLUE SHIELD | Admitting: Family Medicine

## 2018-03-15 VITALS — BP 168/100 | HR 97 | Temp 98.3°F | Wt 169.8 lb

## 2018-03-15 DIAGNOSIS — F5101 Primary insomnia: Secondary | ICD-10-CM

## 2018-03-15 DIAGNOSIS — E0590 Thyrotoxicosis, unspecified without thyrotoxic crisis or storm: Secondary | ICD-10-CM

## 2018-03-15 DIAGNOSIS — I10 Essential (primary) hypertension: Secondary | ICD-10-CM | POA: Diagnosis not present

## 2018-03-15 MED ORDER — ALISKIREN FUMARATE 300 MG PO TABS: 300.0000 mg | ORAL_TABLET | Freq: Every day | ORAL | 1 refills | Status: DC

## 2018-03-15 MED ORDER — ESZOPICLONE 3 MG PO TABS: 3.0000 mg | ORAL_TABLET | Freq: Every day | ORAL | 1 refills | Status: DC

## 2018-03-15 MED ORDER — VERAPAMIL HCL ER 240 MG PO TBCR: 240.0000 mg | EXTENDED_RELEASE_TABLET | Freq: Every day | ORAL | 1 refills | Status: DC

## 2018-03-15 NOTE — Progress Notes (Signed)
BP (!) 168/100 (BP Location: Left Arm, Cuff Size: Normal)   Pulse 97   Temp 98.3 F (36.8 C) (Oral)   Wt 169 lb 12.8 oz (77 kg)   LMP  (LMP Unknown)   SpO2 99%   BMI 29.33 kg/m    Subjective:    Patient ID: Jasmine Camacho, female    DOB: 06-18-1955, 63 y.o.   MRN: [id number]  HPI: Jasmine Camacho is a 63 y.o. female  Chief Complaint  Patient presents with  . Follow-up  . Hypertension  . Thyroid Problem  . Insomnia   HYPERTENSION- took 1/2 a claritin D today- why BP is running high she thinks Hypertension status: uncontrolled  Satisfied with current treatment? yes Duration of hypertension: chronic BP monitoring frequency:  not checking BP medication side effects:  no Medication compliance: excellent compliance Previous BP meds: tekurna, verampamil Aspirin: no Recurrent headaches: no Visual changes: no Palpitations: no Dyspnea: no Chest pain: no Lower extremity edema: no Dizzy/lightheaded: no  INSOMNIA Duration: chronic Satisfied with sleep quality: no Difficulty falling asleep: no Difficulty staying asleep: no Waking a few hours after sleep onset: no Early morning awakenings: no Daytime hypersomnolence: no Wakes feeling refreshed: no Good sleep hygiene: no Apnea: no Snoring: no Depressed/anxious mood: no Recent stress: no Restless legs/nocturnal leg cramps: no Chronic pain/arthritis: no History of sleep study: no Treatments attempted: melatonin, uinsom and benadryl   Relevant past medical, surgical, family and social history reviewed and updated as indicated. Interim medical history since our last visit reviewed. Allergies and medications reviewed and updated.  Review of Systems  Constitutional: Negative.   Respiratory: Negative.   Cardiovascular: Negative.   Gastrointestinal: Negative.   Neurological: Negative.   Psychiatric/Behavioral: Negative.     Per HPI unless specifically indicated above     Objective:    BP (!) 168/100 (BP Location:  Left Arm, Cuff Size: Normal)   Pulse 97   Temp 98.3 F (36.8 C) (Oral)   Wt 169 lb 12.8 oz (77 kg)   LMP  (LMP Unknown)   SpO2 99%   BMI 29.33 kg/m   Wt Readings from Last 3 Encounters:  03/15/18 169 lb 12.8 oz (77 kg)  09/09/17 167 lb (75.8 kg)  05/10/17 173 lb 1.6 oz (78.5 kg)    Physical Exam Vitals signs and nursing note reviewed.  Constitutional:      General: She is not in acute distress.    Appearance: Normal appearance. She is not ill-appearing, toxic-appearing or diaphoretic.  HENT:     Head: Normocephalic and atraumatic.     Right Ear: External ear normal.     Left Ear: External ear normal.     Nose: Nose normal.     Mouth/Throat:     Mouth: Mucous membranes are moist.     Pharynx: Oropharynx is clear.  Eyes:     General: No scleral icterus.       Right eye: No discharge.        Left eye: No discharge.     Extraocular Movements: Extraocular movements intact.     Conjunctiva/sclera: Conjunctivae normal.     Pupils: Pupils are equal, round, and reactive to light.  Neck:     Musculoskeletal: Normal range of motion and neck supple.  Cardiovascular:     Rate and Rhythm: Normal rate and regular rhythm.     Pulses: Normal pulses.     Heart sounds: Normal heart sounds. No murmur. No friction rub. No gallop.  Pulmonary:     Effort: Pulmonary effort is normal. No respiratory distress.     Breath sounds: Normal breath sounds. No stridor. No wheezing, rhonchi or rales.  Chest:     Chest wall: No tenderness.  Musculoskeletal: Normal range of motion.  Skin:    General: Skin is warm and dry.     Capillary Refill: Capillary refill takes less than 2 seconds.     Coloration: Skin is not jaundiced or pale.     Findings: No bruising, erythema, lesion or rash.  Neurological:     General: No focal deficit present.     Mental Status: She is alert and oriented to person, place, and time. Mental status is at baseline.  Psychiatric:        Mood and Affect: Mood normal.         Behavior: Behavior normal.        Thought Content: Thought content normal.        Judgment: Judgment normal.     Results for orders placed or performed in visit on 03/15/18  Basic metabolic panel  Result Value Ref Range   Glucose 57 (L) 65 - 99 mg/dL   BUN 12 8 - 27 mg/dL   Creatinine, Ser 1.61 0.57 - 1.00 mg/dL   GFR calc non Af Amer 65 >59 mL/min/1.73   GFR calc Af Amer 75 >59 mL/min/1.73   BUN/Creatinine Ratio 13 12 - 28   Sodium 140 134 - 144 mmol/L   Potassium 4.5 3.5 - 5.2 mmol/L   Chloride 105 96 - 106 mmol/L   CO2 20 20 - 29 mmol/L   Calcium 9.4 8.7 - 10.3 mg/dL      Assessment & Plan:   Problem List Items Addressed This Visit      Cardiovascular and Mediastinum   Hypertension - Primary    Not under good control. Possibly due to claritin-D, discussed the importance of avoiding sudafed or "D" containing medicines. Will work on [employer] and recheck at physical in 4 weeks.       Relevant Medications   aliskiren (TEKTURNA) 300 MG tablet   verapamil (CALAN-SR) 240 MG CR tablet   Other Relevant Orders   Basic metabolic panel (Completed)     Endocrine   Hyperthyroidism    Stable. Continues to follow with endocrinology. Call with any concerns.         Other   Insomnia    Stable on current regimen. Continue current regimen. Continue to monitor. Refills for 3 months given today.          Follow up plan: Return in about 4 weeks (around 04/12/2018) for Physical/Blood pressure.

## 2018-03-15 NOTE — Patient Instructions (Signed)
DASH Eating Plan  DASH stands for "Dietary Approaches to Stop Hypertension." The DASH eating plan is a healthy eating plan that has been shown to reduce high blood pressure (hypertension). It may also reduce your risk for type 2 diabetes, heart disease, and stroke. The DASH eating plan may also help with weight loss.  What are tips for following this plan?    General guidelines   Avoid eating more than 2,300 mg (milligrams) of salt (sodium) a day. If you have hypertension, you may need to reduce your sodium intake to 1,500 mg a day.   Limit alcohol intake to no more than 1 drink a day for nonpregnant women and 2 drinks a day for men. One drink equals 12 oz of beer, 5 oz of wine, or 1 oz of hard liquor.   Work with your health care provider to maintain a healthy body weight or to lose weight. Ask what an ideal weight is for you.   Get at least 30 minutes of exercise that causes your heart to beat faster (aerobic exercise) most days of the week. Activities may include walking, swimming, or biking.   Work with your health care provider or diet and nutrition specialist (dietitian) to adjust your eating plan to your individual calorie needs.  Reading food labels     Check food labels for the amount of sodium per serving. Choose foods with less than 5 percent of the Daily Value of sodium. Generally, foods with less than 300 mg of sodium per serving fit into this eating plan.   To find whole grains, look for the word "whole" as the first word in the ingredient list.  Shopping   Buy products labeled as "low-sodium" or "no salt added."   Buy fresh foods. Avoid canned foods and premade or frozen meals.  Cooking   Avoid adding salt when cooking. Use salt-free seasonings or herbs instead of table salt or sea salt. Check with your health care provider or pharmacist before using salt substitutes.   Do not fry foods. Cook foods using healthy methods such as baking, boiling, grilling, and broiling instead.   Cook with  heart-healthy oils, such as olive, canola, soybean, or sunflower oil.  Meal planning   Eat a balanced diet that includes:  ? 5 or more servings of fruits and vegetables each day. At each meal, try to fill half of your plate with fruits and vegetables.  ? Up to 6-8 servings of whole grains each day.  ? Less than 6 oz of lean meat, poultry, or fish each day. A 3-oz serving of meat is about the same size as a deck of cards. One egg equals 1 oz.  ? 2 servings of low-fat dairy each day.  ? A serving of nuts, seeds, or beans 5 times each week.  ? Heart-healthy fats. Healthy fats called Omega-3 fatty acids are found in foods such as flaxseeds and coldwater fish, like sardines, salmon, and mackerel.   Limit how much you eat of the following:  ? Canned or prepackaged foods.  ? Food that is high in trans fat, such as fried foods.  ? Food that is high in saturated fat, such as fatty meat.  ? Sweets, desserts, sugary drinks, and other foods with added sugar.  ? Full-fat dairy products.   Do not salt foods before eating.   Try to eat at least 2 vegetarian meals each week.   Eat more home-cooked food and less restaurant, buffet, and fast food.     When eating at a restaurant, ask that your food be prepared with less salt or no salt, if possible.  What foods are recommended?  The items listed may not be a complete list. Talk with your dietitian about what dietary choices are best for you.  Grains  Whole-grain or whole-wheat bread. Whole-grain or whole-wheat pasta. Brown rice. Oatmeal. Quinoa. Bulgur. Whole-grain and low-sodium cereals. Pita bread. Low-fat, low-sodium crackers. Whole-wheat flour tortillas.  Vegetables  Fresh or frozen vegetables (raw, steamed, roasted, or grilled). Low-sodium or reduced-sodium tomato and vegetable juice. Low-sodium or reduced-sodium tomato sauce and tomato paste. Low-sodium or reduced-sodium canned vegetables.  Fruits  All fresh, dried, or frozen fruit. Canned fruit in natural juice (without  added sugar).  Meat and other protein foods  Skinless chicken or turkey. Ground chicken or turkey. Pork with fat trimmed off. Fish and seafood. Egg whites. Dried beans, peas, or lentils. Unsalted nuts, nut butters, and seeds. Unsalted canned beans. Lean cuts of beef with fat trimmed off. Low-sodium, lean deli meat.  Dairy  Low-fat (1%) or fat-free (skim) milk. Fat-free, low-fat, or reduced-fat cheeses. Nonfat, low-sodium ricotta or cottage cheese. Low-fat or nonfat yogurt. Low-fat, low-sodium cheese.  Fats and oils  Soft margarine without trans fats. Vegetable oil. Low-fat, reduced-fat, or light mayonnaise and salad dressings (reduced-sodium). Canola, safflower, olive, soybean, and sunflower oils. Avocado.  Seasoning and other foods  Herbs. Spices. Seasoning mixes without salt. Unsalted popcorn and pretzels. Fat-free sweets.  What foods are not recommended?  The items listed may not be a complete list. Talk with your dietitian about what dietary choices are best for you.  Grains  Baked goods made with fat, such as croissants, muffins, or some breads. Dry pasta or rice meal packs.  Vegetables  Creamed or fried vegetables. Vegetables in a cheese sauce. Regular canned vegetables (not low-sodium or reduced-sodium). Regular canned tomato sauce and paste (not low-sodium or reduced-sodium). Regular tomato and vegetable juice (not low-sodium or reduced-sodium). Pickles. Olives.  Fruits  Canned fruit in a light or heavy syrup. Fried fruit. Fruit in cream or butter sauce.  Meat and other protein foods  Fatty cuts of meat. Ribs. Fried meat. Bacon. Sausage. Bologna and other processed lunch meats. Salami. Fatback. Hotdogs. Bratwurst. Salted nuts and seeds. Canned beans with added salt. Canned or smoked fish. Whole eggs or egg yolks. Chicken or turkey with skin.  Dairy  Whole or 2% milk, cream, and half-and-half. Whole or full-fat cream cheese. Whole-fat or sweetened yogurt. Full-fat cheese. Nondairy creamers. Whipped toppings.  Processed cheese and cheese spreads.  Fats and oils  Butter. Stick margarine. Lard. Shortening. Ghee. Bacon fat. Tropical oils, such as coconut, palm kernel, or palm oil.  Seasoning and other foods  Salted popcorn and pretzels. Onion salt, garlic salt, seasoned salt, table salt, and sea salt. Worcestershire sauce. Tartar sauce. Barbecue sauce. Teriyaki sauce. Soy sauce, including reduced-sodium. Steak sauce. Canned and packaged gravies. Fish sauce. Oyster sauce. Cocktail sauce. Horseradish that you find on the shelf. Ketchup. Mustard. Meat flavorings and tenderizers. Bouillon cubes. Hot sauce and Tabasco sauce. Premade or packaged marinades. Premade or packaged taco seasonings. Relishes. Regular salad dressings.  Where to find more information:   National Heart, Lung, and Blood Institute: www.nhlbi.nih.gov   American Heart Association: www.heart.org  Summary   The DASH eating plan is a healthy eating plan that has been shown to reduce high blood pressure (hypertension). It may also reduce your risk for type 2 diabetes, heart disease, and stroke.   With the   DASH eating plan, you should limit salt (sodium) intake to 2,300 mg a day. If you have hypertension, you may need to reduce your sodium intake to 1,500 mg a day.   When on the DASH eating plan, aim to eat more fresh fruits and vegetables, whole grains, lean proteins, low-fat dairy, and heart-healthy fats.   Work with your health care provider or diet and nutrition specialist (dietitian) to adjust your eating plan to your individual calorie needs.  This information is not intended to replace advice given to you by your health care provider. Make sure you discuss any questions you have with your health care provider.  Document Released: 01/15/2011 Document Revised: 01/20/2016 Document Reviewed: 01/20/2016  Elsevier Interactive Patient Education  2019 [employer]

## 2018-03-16 ENCOUNTER — Encounter: Payer: Self-pay | Admitting: Family Medicine

## 2018-03-16 LAB — BASIC METABOLIC PANEL
BUN/Creatinine Ratio: 13 (ref 12–28)
BUN: 12 mg/dL (ref 8–27)
CHLORIDE: 105 mmol/L (ref 96–106)
CO2: 20 mmol/L (ref 20–29)
Calcium: 9.4 mg/dL (ref 8.7–10.3)
Creatinine, Ser: 0.95 mg/dL (ref 0.57–1.00)
GFR calc Af Amer: 75 mL/min/{1.73_m2} (ref >59)
GFR calc non Af Amer: 65 mL/min/{1.73_m2} (ref >59)
Glucose: 57 mg/dL — ABNORMAL LOW (ref 65–99)
Potassium: 4.5 mmol/L (ref 3.5–5.2)
Sodium: 140 mmol/L (ref 134–144)

## 2018-03-16 NOTE — Assessment & Plan Note (Signed)
Stable on current regimen. Continue current regimen. Continue to monitor. Refills for 3 months given today.

## 2018-03-16 NOTE — Assessment & Plan Note (Addendum)
Not under good control. Possibly due to claritin-D, discussed the importance of avoiding sudafed or "D" containing medicines. Will work on [employer] and recheck at physical in 4 weeks.

## 2018-03-16 NOTE — Assessment & Plan Note (Signed)
Stable. Continues to follow with endocrinology. Call with any concerns.

## 2018-05-12 ENCOUNTER — Encounter: Payer: BLUE CROSS/BLUE SHIELD | Admitting: Family Medicine

## 2018-05-13 ENCOUNTER — Ambulatory Visit: Payer: BLUE CROSS/BLUE SHIELD | Admitting: Unknown Physician Specialty

## 2018-06-09 ENCOUNTER — Ambulatory Visit (INDEPENDENT_AMBULATORY_CARE_PROVIDER_SITE_OTHER): Payer: BLUE CROSS/BLUE SHIELD | Admitting: Family Medicine

## 2018-06-09 ENCOUNTER — Ambulatory Visit: Payer: Self-pay | Admitting: *Deleted

## 2018-06-09 ENCOUNTER — Other Ambulatory Visit: Payer: Self-pay

## 2018-06-09 ENCOUNTER — Encounter: Payer: Self-pay | Admitting: Family Medicine

## 2018-06-09 VITALS — BP 156/109 | HR 96 | Temp 97.5°F | Ht 64.0 in | Wt 160.0 lb

## 2018-06-09 DIAGNOSIS — J069 Acute upper respiratory infection, unspecified: Secondary | ICD-10-CM | POA: Diagnosis not present

## 2018-06-09 NOTE — Telephone Encounter (Signed)
Please schedule for virtual visit ASAP

## 2018-06-09 NOTE — Assessment & Plan Note (Signed)
Unable to evaluate patient visually due to lack of equipment. Given the reported severity of her SOB with talking on the phone and walking from 1 room to another, she needs to be evaluated in person. Advised patient to go to urgent care for evaluation ASAP so they can listen to her lungs. Advised her that they are not testing outpatient currently. She is aware and will go to the urgent care.

## 2018-06-09 NOTE — Progress Notes (Signed)
BP (!) 156/109 (BP Location: Left Arm, Patient Position: Sitting, Cuff Size: Normal)   Pulse 96   Temp (!) 97.5 F (36.4 C) (Oral)   Ht 5\' 4"  (1.626 m)   Wt 160 lb (72.6 kg)   LMP  (LMP Unknown)   BMI 27.46 kg/m    Subjective:    Patient ID: Jasmine Camacho, female    DOB: Sep 03, 1955, 64 y.o.   MRN: [id number]  HPI: Jasmine Camacho is a 63 y.o. female  Chief Complaint  Patient presents with  . Cough    Ongoing 1 month. Symptoms becoming worse. Productive Cough.   . Shortness of Breath  . Diarrhea  . Muscle Pain  . Fatigue  . Nasal Congestion   Has been sick for several weeks. Notes that she has been worse in the last month. She is concerned that she has COVID and notes that she wants to be tested. She and her husband state that she is extremely short of breath and has been getting short of breath just talking on the phone or walking from room to room.   UPPER RESPIRATORY TRACT INFECTION Worst symptom: cough, SOB Fever: yes- subjective, chills and sweats Cough: no Shortness of breath: yes Wheezing: yes Chest pain: no Chest tightness: yes Chest congestion: yes Nasal congestion: yes Runny nose: yes Post nasal drip: yes Sneezing: yes Sore throat: yes Swollen glands: no Sinus pressure: no Headache: no Face pain: no Toothache: no Ear pain: no  Ear pressure: yes left Eyes red/itching:yes Eye drainage/crusting: yes  Vomiting: no, had some diarrhea last week Rash: no Fatigue: yes Sick contacts: unknown Strep contacts: no  Context: worse Recurrent sinusitis: no Relief with OTC cold/cough medications: no  Treatments attempted: anti-histamine and pseudoephedrine   Relevant past medical, surgical, family and social history reviewed and updated as indicated. Interim medical history since our last visit reviewed. Allergies and medications reviewed and updated.  Review of Systems  Constitutional: Positive for chills, fatigue and fever. Negative for activity change,  appetite change, diaphoresis and unexpected weight change.  HENT: Positive for congestion, postnasal drip, rhinorrhea and sore throat. Negative for dental problem, drooling, ear discharge, ear pain, facial swelling, hearing loss, mouth sores, nosebleeds, sinus pressure, sinus pain, sneezing, tinnitus, trouble swallowing and voice change.   Eyes: Positive for discharge, redness and itching. Negative for photophobia, pain and visual disturbance.  Respiratory: Positive for cough, chest tightness, shortness of breath and wheezing. Negative for apnea, choking and stridor.   Cardiovascular: Negative.   Gastrointestinal: Positive for diarrhea and nausea. Negative for abdominal distention, abdominal pain, anal bleeding, blood in stool, constipation, rectal pain and vomiting.  Skin: Negative.   Psychiatric/Behavioral: Negative.     Per HPI unless specifically indicated above     Objective:    BP (!) 156/109 (BP Location: Left Arm, Patient Position: Sitting, Cuff Size: Normal)   Pulse 96   Temp (!) 97.5 F (36.4 C) (Oral)   Ht 5\' 4"  (1.626 m)   Wt 160 lb (72.6 kg)   LMP  (LMP Unknown)   BMI 27.46 kg/m   Wt Readings from Last 3 Encounters:  06/09/18 160 lb (72.6 kg)  03/15/18 169 lb 12.8 oz (77 kg)  09/09/17 167 lb (75.8 kg)    Physical Exam Vitals signs and nursing note reviewed.  Pulmonary:     Effort: Pulmonary effort is normal. No respiratory distress.     Comments: Speaking in full sentences Neurological:     Mental Status: She is  alert.  Psychiatric:        Mood and Affect: Mood normal.        Behavior: Behavior normal.        Thought Content: Thought content normal.        Judgment: Judgment normal.     Results for orders placed or performed in visit on 03/15/18  Basic metabolic panel  Result Value Ref Range   Glucose 57 (L) 65 - 99 mg/dL   BUN 12 8 - 27 mg/dL   Creatinine, Ser 3.79 0.57 - 1.00 mg/dL   GFR calc non Af Amer 65 >59 mL/min/1.73   GFR calc Af Amer 75 >59  mL/min/1.73   BUN/Creatinine Ratio 13 12 - 28   Sodium 140 134 - 144 mmol/L   Potassium 4.5 3.5 - 5.2 mmol/L   Chloride 105 96 - 106 mmol/L   CO2 20 20 - 29 mmol/L   Calcium 9.4 8.7 - 10.3 mg/dL      Assessment & Plan:   Problem List Items Addressed This Visit      Respiratory   Upper respiratory tract infection - Primary    Unable to evaluate patient visually due to lack of equipment. Given the reported severity of her SOB with talking on the phone and walking from 1 room to another, she needs to be evaluated in person. Advised patient to go to urgent care for evaluation ASAP so they can listen to her lungs. Advised her that they are not testing outpatient currently. She is aware and will go to the urgent care.            Follow up plan: Return if symptoms worsen or fail to improve.   . This visit was completed via telephone due to the restrictions of the COVID-19 pandemic. All issues as above were discussed and addressed but no physical exam was performed. If it was felt that the patient should be evaluated in the office, they were directed there. The patient verbally consented to this visit. Patient was unable to complete an audio/visual visit due to Lack of equipment. Due to the catastrophic nature of the COVID-19 pandemic, this visit was done through audio contact only. . Location of the patient: home . Location of the provider: home . Those involved with this call:  . Provider: Olevia Perches, DO . CMA: Myrtha Mantis, CMA . Front Desk/Registration: Adela Ports  . Time spent on call: 20  minutes on the phone discussing health concerns. 25 minutes total spent in review of patient's record and preparation of their chart.

## 2018-06-09 NOTE — Telephone Encounter (Signed)
Patient and husband are calling to report patient has not been feeling well and seems to be getting worse. Patient is starting to develop respiratory symptoms. Patient has been taking Claritin D, cough drops, tylenol- arthritis strength for muscle aches. Patient started with cough for several weeks and for the past 2-3 days she has had SOB with exertion. Weakness, fatigue, some dizziness, diarrhea, sore throat. No fever at this time. Call to office to report symptoms. ED or virtual visit- they request note for review and they will call patient with PCP instruction. Contact: [phone]  Reason for Disposition . MILD difficulty breathing (e.g., minimal/no SOB at rest, SOB with walking, pulse <100)  Answer Assessment - Initial Assessment Questions 1. COVID-19 DIAGNOSIS: "Who made your Coronavirus (COVID-19) diagnosis?" "Was it confirmed by a positive lab test?" If not diagnosed by a HCP, ask "Are there lots of cases (community spread) where you live?" (See public health department website, if unsure)   * MAJOR community spread: high number of cases; numbers of cases are increasing; many people hospitalized.   * MINOR community spread: low number of cases; not increasing; few or no people hospitalized     community 2. ONSET: "When did the COVID-19 symptoms start?"      Patient has been feeling bad for over 1 month- SOB 2 days 3. WORST SYMPTOM: "What is your worst symptom?" (e.g., cough, fever, shortness of breath, muscle aches)     SOB 4. COUGH: "How bad is the cough?"       Comes and goes- deep in chest- clear to yellow mucus 5. FEVER: "Do you have a fever?" If so, ask: "What is your temperature, how was it measured, and when did it start?"     97.8- today 6. RESPIRATORY STATUS: "Describe your breathing?" (e.g., shortness of breath, wheezing, unable to speak)      SOB- 2 days- hard to walk extended length without getting winded, feels like she is not getting enough air in- labored sitting,  dizziness 7. BETTER-SAME-WORSE: "Are you getting better, staying the same or getting worse compared to yesterday?"  If getting worse, ask, "In what way?"     Worse, breathing is worse 8. HIGH RISK DISEASE: "Do you have any chronic medical problems?" (e.g., asthma, heart or lung disease, weak immune system, etc.)     no 9. PREGNANCY: "Is there any chance you are pregnant?" "When was your last menstrual period?"     n/a 10. OTHER SYMPTOMS: "Do you have any other symptoms?"  (e.g., runny nose, headache, sore throat, loss of smell)       Diarrhea, sore throat, runny nose, sneezing  Protocols used: CORONAVIRUS (COVID-19) DIAGNOSED OR SUSPECTED-A-AH

## 2018-06-30 ENCOUNTER — Telehealth: Payer: Self-pay | Admitting: Family Medicine

## 2018-06-30 NOTE — Telephone Encounter (Signed)
Perfect!  Thank you so much.

## 2018-06-30 NOTE — Telephone Encounter (Signed)
Yes please. I would like to see her in person next week. Thank you. Can we also find out when she went to the urgent care- she was advised to go 3 weeks ago, and I do not see that she went within our system. Thanks!

## 2018-06-30 NOTE — Telephone Encounter (Signed)
Copied from CRM [id number]. Topic: Quick Communication - See Telephone Encounter >> Jun 30, 2018 11:53 AM Jens Som A wrote: CRM for notification. See Telephone encounter for: 06/30/18.Patient husband is calling because his wife went to urgent care and the patient had an slightly enlarged heart, and treating for neuponia. Tested negative for COVID 19 Placed the patient on levaquin 750mg  for 5 days and an inhaler.  Patient has appt with Dr Laural Benes on 07/25/2018 Does Dr. Laural Benes need to she the patient sooner Please advise [phone]

## 2018-07-07 ENCOUNTER — Other Ambulatory Visit: Payer: Self-pay | Admitting: Family Medicine

## 2018-07-08 ENCOUNTER — Encounter: Payer: Self-pay | Admitting: Family Medicine

## 2018-07-08 ENCOUNTER — Ambulatory Visit: Payer: BLUE CROSS/BLUE SHIELD | Admitting: Family Medicine

## 2018-07-08 ENCOUNTER — Other Ambulatory Visit: Payer: Self-pay

## 2018-07-08 VITALS — BP 148/97 | HR 89 | Temp 99.1°F | Ht 64.0 in | Wt 162.0 lb

## 2018-07-08 DIAGNOSIS — J189 Pneumonia, unspecified organism: Secondary | ICD-10-CM | POA: Diagnosis not present

## 2018-07-08 MED ORDER — PREDNISONE 50 MG PO TABS: 50.0000 mg | ORAL_TABLET | Freq: Every day | ORAL | 0 refills | Status: DC

## 2018-07-08 MED ORDER — ALBUTEROL SULFATE HFA 108 (90 BASE) MCG/ACT IN AERS: INHALATION_SPRAY | RESPIRATORY_TRACT | 0 refills | Status: DC

## 2018-07-08 NOTE — Progress Notes (Signed)
BP (!) 148/97   Pulse 89   Temp 99.1 F (37.3 C) (Oral)   Ht 5\' 4"  (1.626 m)   Wt 162 lb (73.5 kg)   LMP  (LMP Unknown)   SpO2 98%   BMI 27.81 kg/m    Subjective:    Patient ID: Jasmine Camacho, female    DOB: Nov 19, 1955, 63 y.o.   MRN: [id number]  HPI: LASTAR FEDELI is a 63 y.o. female  Chief Complaint  Patient presents with  . Cough    went to urgent care 5 /18 given levaquin and ventolin  . Shortness of Breath  . Fatigue   Went to the urgent care about 2 weeks ago. Diagnosed with pneumonia. Treated with levaquin and ventolin. Seems to be feeling significantly better. Continues with cough. No SOB. No wheezing. No other concerns or complaints at this time. Otherwise feeling well.   Relevant past medical, surgical, family and social history reviewed and updated as indicated. Interim medical history since our last visit reviewed. Allergies and medications reviewed and updated.  Review of Systems  Constitutional: Negative.   HENT: Negative.   Respiratory: Positive for cough. Negative for apnea, choking, chest tightness, shortness of breath, wheezing and stridor.   Cardiovascular: Negative.   Psychiatric/Behavioral: Negative.     Per HPI unless specifically indicated above     Objective:    BP (!) 148/97   Pulse 89   Temp 99.1 F (37.3 C) (Oral)   Ht 5\' 4"  (1.626 m)   Wt 162 lb (73.5 kg)   LMP  (LMP Unknown)   SpO2 98%   BMI 27.81 kg/m   Wt Readings from Last 3 Encounters:  07/08/18 162 lb (73.5 kg)  06/09/18 160 lb (72.6 kg)  03/15/18 169 lb 12.8 oz (77 kg)    Physical Exam Vitals signs and nursing note reviewed.  Constitutional:      General: She is not in acute distress.    Appearance: Normal appearance. She is not ill-appearing, toxic-appearing or diaphoretic.  HENT:     Head: Normocephalic and atraumatic.     Right Ear: External ear normal.     Left Ear: External ear normal.     Nose: Nose normal.     Mouth/Throat:     Mouth: Mucous membranes are  moist.     Pharynx: Oropharynx is clear.  Eyes:     General: No scleral icterus.       Right eye: No discharge.        Left eye: No discharge.     Extraocular Movements: Extraocular movements intact.     Conjunctiva/sclera: Conjunctivae normal.     Pupils: Pupils are equal, round, and reactive to light.  Neck:     Musculoskeletal: Normal range of motion and neck supple.  Cardiovascular:     Rate and Rhythm: Normal rate and regular rhythm.     Pulses: Normal pulses.     Heart sounds: Normal heart sounds. No murmur. No friction rub. No gallop.   Pulmonary:     Effort: Pulmonary effort is normal. No respiratory distress.     Breath sounds: Normal breath sounds. No stridor. No wheezing, rhonchi or rales.     Comments: Coarse breath sounds throughout Chest:     Chest wall: No tenderness.  Musculoskeletal: Normal range of motion.  Skin:    General: Skin is warm and dry.     Capillary Refill: Capillary refill takes less than 2 seconds.     Coloration:  Skin is not jaundiced or pale.     Findings: No bruising, erythema, lesion or rash.  Neurological:     General: No focal deficit present.     Mental Status: She is alert and oriented to person, place, and time. Mental status is at baseline.  Psychiatric:        Mood and Affect: Mood normal.        Behavior: Behavior normal.        Thought Content: Thought content normal.        Judgment: Judgment normal.     Results for orders placed or performed in visit on 03/15/18  Basic metabolic panel  Result Value Ref Range   Glucose 57 (L) 65 - 99 mg/dL   BUN 12 8 - 27 mg/dL   Creatinine, Ser 2.75 0.57 - 1.00 mg/dL   GFR calc non Af Amer 65 >59 mL/min/1.73   GFR calc Af Amer 75 >59 mL/min/1.73   BUN/Creatinine Ratio 13 12 - 28   Sodium 140 134 - 144 mmol/L   Potassium 4.5 3.5 - 5.2 mmol/L   Chloride 105 96 - 106 mmol/L   CO2 20 20 - 29 mmol/L   Calcium 9.4 8.7 - 10.3 mg/dL      Assessment & Plan:   Problem List Items Addressed  This Visit    None    Visit Diagnoses    Pneumonia due to infectious organism, unspecified laterality, unspecified part of lung    -  Primary   Resolving. Will repeat x-ray and treat with prednisone. Call with any concerns. Continue to monitor.    Relevant Medications   albuterol (VENTOLIN HFA) 108 (90 Base) MCG/ACT inhaler   Other Relevant Orders   DG Chest 2 View       Follow up plan: Return in about 2 weeks (around 07/22/2018).

## 2018-07-12 ENCOUNTER — Ambulatory Visit
Admission: RE | Admit: 2018-07-12 | Discharge: 2018-07-12 | Disposition: A | Discharge status: Home / Self Care | Payer: BLUE CROSS/BLUE SHIELD | Attending: Family Medicine | Admitting: Family Medicine

## 2018-07-12 ENCOUNTER — Ambulatory Visit
Admission: RE | Admit: 2018-07-12 | Discharge: 2018-07-12 | Disposition: A | Discharge status: Home / Self Care | Payer: BLUE CROSS/BLUE SHIELD | Source: Ambulatory Visit | Attending: Family Medicine | Admitting: Family Medicine

## 2018-07-12 ENCOUNTER — Other Ambulatory Visit: Payer: Self-pay

## 2018-07-12 DIAGNOSIS — J189 Pneumonia, unspecified organism: Secondary | ICD-10-CM | POA: Diagnosis not present

## 2018-07-15 ENCOUNTER — Other Ambulatory Visit: Payer: Self-pay | Admitting: Family Medicine

## 2018-07-15 ENCOUNTER — Telehealth: Payer: Self-pay | Admitting: Family Medicine

## 2018-07-15 NOTE — Telephone Encounter (Signed)
Please let her know that her x-ray looks normal. No sign of pneumonia. Thanks!

## 2018-07-15 NOTE — Telephone Encounter (Signed)
Patient notified of x-ray results.

## 2018-07-15 NOTE — Telephone Encounter (Signed)
Called patient. No answer. Left VM for patient to return call to the office.  

## 2018-07-22 ENCOUNTER — Telehealth: Payer: Self-pay | Admitting: Family Medicine

## 2018-07-22 NOTE — Telephone Encounter (Signed)
Called to go over script screening no answer left vm to call back

## 2018-07-25 ENCOUNTER — Other Ambulatory Visit: Payer: Self-pay

## 2018-07-25 ENCOUNTER — Ambulatory Visit (INDEPENDENT_AMBULATORY_CARE_PROVIDER_SITE_OTHER): Payer: BLUE CROSS/BLUE SHIELD | Admitting: Family Medicine

## 2018-07-25 ENCOUNTER — Encounter: Payer: Self-pay | Admitting: Family Medicine

## 2018-07-25 VITALS — BP 168/72 | Temp 98.0°F | Ht 64.0 in

## 2018-07-25 DIAGNOSIS — I10 Essential (primary) hypertension: Secondary | ICD-10-CM | POA: Diagnosis not present

## 2018-07-25 DIAGNOSIS — J189 Pneumonia, unspecified organism: Secondary | ICD-10-CM | POA: Diagnosis not present

## 2018-07-25 NOTE — Patient Instructions (Signed)
DASH Eating Plan  DASH stands for "Dietary Approaches to Stop Hypertension." The DASH eating plan is a healthy eating plan that has been shown to reduce high blood pressure (hypertension). It may also reduce your risk for type 2 diabetes, heart disease, and stroke. The DASH eating plan may also help with weight loss.  What are tips for following this plan?    General guidelines   Avoid eating more than 2,300 mg (milligrams) of salt (sodium) a day. If you have hypertension, you may need to reduce your sodium intake to 1,500 mg a day.   Limit alcohol intake to no more than 1 drink a day for nonpregnant women and 2 drinks a day for men. One drink equals 12 oz of beer, 5 oz of wine, or 1 oz of hard liquor.   Work with your health care provider to maintain a healthy body weight or to lose weight. Ask what an ideal weight is for you.   Get at least 30 minutes of exercise that causes your heart to beat faster (aerobic exercise) most days of the week. Activities may include walking, swimming, or biking.   Work with your health care provider or diet and nutrition specialist (dietitian) to adjust your eating plan to your individual calorie needs.  Reading food labels     Check food labels for the amount of sodium per serving. Choose foods with less than 5 percent of the Daily Value of sodium. Generally, foods with less than 300 mg of sodium per serving fit into this eating plan.   To find whole grains, look for the word "whole" as the first word in the ingredient list.  Shopping   Buy products labeled as "low-sodium" or "no salt added."   Buy fresh foods. Avoid canned foods and premade or frozen meals.  Cooking   Avoid adding salt when cooking. Use salt-free seasonings or herbs instead of table salt or sea salt. Check with your health care provider or pharmacist before using salt substitutes.   Do not fry foods. Cook foods using healthy methods such as baking, boiling, grilling, and broiling instead.   Cook with  heart-healthy oils, such as olive, canola, soybean, or sunflower oil.  Meal planning   Eat a balanced diet that includes:  ? 5 or more servings of fruits and vegetables each day. At each meal, try to fill half of your plate with fruits and vegetables.  ? Up to 6-8 servings of whole grains each day.  ? Less than 6 oz of lean meat, poultry, or fish each day. A 3-oz serving of meat is about the same size as a deck of cards. One egg equals 1 oz.  ? 2 servings of low-fat dairy each day.  ? A serving of nuts, seeds, or beans 5 times each week.  ? Heart-healthy fats. Healthy fats called Omega-3 fatty acids are found in foods such as flaxseeds and coldwater fish, like sardines, salmon, and mackerel.   Limit how much you eat of the following:  ? Canned or prepackaged foods.  ? Food that is high in trans fat, such as fried foods.  ? Food that is high in saturated fat, such as fatty meat.  ? Sweets, desserts, sugary drinks, and other foods with added sugar.  ? Full-fat dairy products.   Do not salt foods before eating.   Try to eat at least 2 vegetarian meals each week.   Eat more home-cooked food and less restaurant, buffet, and fast food.     When eating at a restaurant, ask that your food be prepared with less salt or no salt, if possible.  What foods are recommended?  The items listed may not be a complete list. Talk with your dietitian about what dietary choices are best for you.  Grains  Whole-grain or whole-wheat bread. Whole-grain or whole-wheat pasta. Brown rice. Oatmeal. Quinoa. Bulgur. Whole-grain and low-sodium cereals. Pita bread. Low-fat, low-sodium crackers. Whole-wheat flour tortillas.  Vegetables  Fresh or frozen vegetables (raw, steamed, roasted, or grilled). Low-sodium or reduced-sodium tomato and vegetable juice. Low-sodium or reduced-sodium tomato sauce and tomato paste. Low-sodium or reduced-sodium canned vegetables.  Fruits  All fresh, dried, or frozen fruit. Canned fruit in natural juice (without  added sugar).  Meat and other protein foods  Skinless chicken or turkey. Ground chicken or turkey. Pork with fat trimmed off. Fish and seafood. Egg whites. Dried beans, peas, or lentils. Unsalted nuts, nut butters, and seeds. Unsalted canned beans. Lean cuts of beef with fat trimmed off. Low-sodium, lean deli meat.  Dairy  Low-fat (1%) or fat-free (skim) milk. Fat-free, low-fat, or reduced-fat cheeses. Nonfat, low-sodium ricotta or cottage cheese. Low-fat or nonfat yogurt. Low-fat, low-sodium cheese.  Fats and oils  Soft margarine without trans fats. Vegetable oil. Low-fat, reduced-fat, or light mayonnaise and salad dressings (reduced-sodium). Canola, safflower, olive, soybean, and sunflower oils. Avocado.  Seasoning and other foods  Herbs. Spices. Seasoning mixes without salt. Unsalted popcorn and pretzels. Fat-free sweets.  What foods are not recommended?  The items listed may not be a complete list. Talk with your dietitian about what dietary choices are best for you.  Grains  Baked goods made with fat, such as croissants, muffins, or some breads. Dry pasta or rice meal packs.  Vegetables  Creamed or fried vegetables. Vegetables in a cheese sauce. Regular canned vegetables (not low-sodium or reduced-sodium). Regular canned tomato sauce and paste (not low-sodium or reduced-sodium). Regular tomato and vegetable juice (not low-sodium or reduced-sodium). Pickles. Olives.  Fruits  Canned fruit in a light or heavy syrup. Fried fruit. Fruit in cream or butter sauce.  Meat and other protein foods  Fatty cuts of meat. Ribs. Fried meat. Bacon. Sausage. Bologna and other processed lunch meats. Salami. Fatback. Hotdogs. Bratwurst. Salted nuts and seeds. Canned beans with added salt. Canned or smoked fish. Whole eggs or egg yolks. Chicken or turkey with skin.  Dairy  Whole or 2% milk, cream, and half-and-half. Whole or full-fat cream cheese. Whole-fat or sweetened yogurt. Full-fat cheese. Nondairy creamers. Whipped toppings.  Processed cheese and cheese spreads.  Fats and oils  Butter. Stick margarine. Lard. Shortening. Ghee. Bacon fat. Tropical oils, such as coconut, palm kernel, or palm oil.  Seasoning and other foods  Salted popcorn and pretzels. Onion salt, garlic salt, seasoned salt, table salt, and sea salt. Worcestershire sauce. Tartar sauce. Barbecue sauce. Teriyaki sauce. Soy sauce, including reduced-sodium. Steak sauce. Canned and packaged gravies. Fish sauce. Oyster sauce. Cocktail sauce. Horseradish that you find on the shelf. Ketchup. Mustard. Meat flavorings and tenderizers. Bouillon cubes. Hot sauce and Tabasco sauce. Premade or packaged marinades. Premade or packaged taco seasonings. Relishes. Regular salad dressings.  Where to find more information:   National Heart, Lung, and Blood Institute: www.nhlbi.nih.gov   American Heart Association: www.heart.org  Summary   The DASH eating plan is a healthy eating plan that has been shown to reduce high blood pressure (hypertension). It may also reduce your risk for type 2 diabetes, heart disease, and stroke.   With the   DASH eating plan, you should limit salt (sodium) intake to 2,300 mg a day. If you have hypertension, you may need to reduce your sodium intake to 1,500 mg a day.   When on the DASH eating plan, aim to eat more fresh fruits and vegetables, whole grains, lean proteins, low-fat dairy, and heart-healthy fats.   Work with your health care provider or diet and nutrition specialist (dietitian) to adjust your eating plan to your individual calorie needs.  This information is not intended to replace advice given to you by your health care provider. Make sure you discuss any questions you have with your health care provider.  Document Released: 01/15/2011 Document Revised: 01/20/2016 Document Reviewed: 01/20/2016  Elsevier Interactive Patient Education  2019 [employer]

## 2018-07-25 NOTE — Assessment & Plan Note (Signed)
Not under good control. Unclear if she has been taking her medicine at the same time every day. Will work on [employer] and recheck in 1 month at physical.

## 2018-07-25 NOTE — Progress Notes (Signed)
BP (!) 168/72   Temp 98 F (36.7 C) (Oral)   Ht 5\' 4"  (1.626 m)   LMP  (LMP Unknown)   BMI 27.81 kg/m    Subjective:    Patient ID: Jasmine Camacho, female    DOB: [birth year], 63 y.o.   MRN: [id number]  HPI: Jasmine Camacho is a 63 y.o. female  Chief Complaint  Patient presents with  . Follow up cough   She notes that she continues with a cough. Has been doing a bit better. No more fever. SOB is improving. Having some trouble going from room to room, but her husband notes that it is significantly better.  HYPERTENSION Hypertension status: uncontrolled  Satisfied with current treatment? yes Duration of hypertension: chronic BP monitoring frequency:  rarely BP medication side effects:  no Medication compliance: unclear Aspirin: no Recurrent headaches: no Visual changes: no Palpitations: no Dyspnea: no Chest pain: no Lower extremity edema: no Dizzy/lightheaded: no  Relevant past medical, surgical, family and social history reviewed and updated as indicated. Interim medical history since our last visit reviewed. Allergies and medications reviewed and updated.  Review of Systems  Constitutional: Negative.   Respiratory: Negative.   Cardiovascular: Negative.   Neurological: Negative.   Psychiatric/Behavioral: Negative.     Per HPI unless specifically indicated above     Objective:    BP (!) 168/72   Temp 98 F (36.7 C) (Oral)   Ht 5\' 4"  (1.626 m)   LMP  (LMP Unknown)   BMI 27.81 kg/m   Wt Readings from Last 3 Encounters:  07/08/18 162 lb (73.5 kg)  06/09/18 160 lb (72.6 kg)  03/15/18 169 lb 12.8 oz (77 kg)    Physical Exam Vitals signs and nursing note reviewed.  Pulmonary:     Effort: Pulmonary effort is normal. No respiratory distress.     Comments: Speaking in full sentences Neurological:     Mental Status: She is alert.  Psychiatric:        Mood and Affect: Mood normal.        Behavior: Behavior normal.        Thought Content: Thought content  normal.        Judgment: Judgment normal.     Results for orders placed or performed in visit on 03/15/18  Basic metabolic panel  Result Value Ref Range   Glucose 57 (L) 65 - 99 mg/dL   BUN 12 8 - 27 mg/dL   Creatinine, Ser 1.77 0.57 - 1.00 mg/dL   GFR calc non Af Amer 65 >59 mL/min/1.73   GFR calc Af Amer 75 >59 mL/min/1.73   BUN/Creatinine Ratio 13 12 - 28   Sodium 140 134 - 144 mmol/L   Potassium 4.5 3.5 - 5.2 mmol/L   Chloride 105 96 - 106 mmol/L   CO2 20 20 - 29 mmol/L   Calcium 9.4 8.7 - 10.3 mg/dL      Assessment & Plan:   Problem List Items Addressed This Visit      Cardiovascular and Mediastinum   Hypertension    Not under good control. Unclear if she has been taking her medicine at the same time every day. Will work on [employer] and recheck in 1 month at physical.        Other Visit Diagnoses    Pneumonia due to infectious organism, unspecified laterality, unspecified part of lung    -  Primary   Resolved. Lungs clear. Feeling well. Call with any concerns.  Follow up plan: Return in about 4 weeks (around 08/22/2018) for Physical.    . This visit was completed via telephone due to the restrictions of the COVID-19 pandemic. All issues as above were discussed and addressed but no physical exam was performed. If it was felt that the patient should be evaluated in the office, they were directed there. The patient verbally consented to this visit. Patient was unable to complete an audio/visual visit due to Lack of equipment. Due to the catastrophic nature of the COVID-19 pandemic, this visit was done through audio contact only. . Location of the patient: home . Location of the provider: work . Those involved with this call:  . Provider: Park Liter, DO . CMA: Lesle Chris, [place] . Front Desk/Registration: Don Perking  . Time spent on call: 15 minutes with patient face to face via video conference. More than 50% of this time was spent in counseling and  coordination of care. 23 minutes total spent in review of patient's record and preparation of their chart.

## 2018-08-15 ENCOUNTER — Telehealth: Payer: Self-pay | Admitting: Family Medicine

## 2018-08-15 NOTE — Telephone Encounter (Signed)
Requested medication (s) are due for refill today: yes  Requested medication (s) are on the active medication list:yes  Last refill:  03/15/2018  Future visit scheduled:yes  Notes to clinic: not delegated    Requested Prescriptions  Pending Prescriptions Disp Refills   Eszopiclone 3 MG TABS [Pharmacy Med Name: ESZOPICLONE 3MG  TABLETS] 90 tablet     Sig: TAKE 1 TABLET BY MOUTH EVERY AT BEDTIME. TAKE IMMEDIATELY BEFORE BEDTIME     Not Delegated - Psychiatry:  Anxiolytics/Hypnotics Failed - 08/15/2018  9:03 AM      Failed - This refill cannot be delegated      Failed - Urine Drug Screen completed in last 360 days.      Passed - Valid encounter within last 6 months    Recent Outpatient Visits          3 weeks ago Pneumonia due to infectious organism, unspecified laterality, unspecified part of lung   [facility] [place], Megan P, DO   1 month ago Pneumonia due to infectious organism, unspecified laterality, unspecified part of lung   [facility], Megan P, DO   2 months ago Upper respiratory tract infection, unspecified type   [employer], Megan P, DO   5 months ago Essential hypertension   [place], [place], DO   11 months ago Essential hypertension   [place], [place], DO      Future Appointments            In 5 months Wynetta Emery, Barb Merino, DO [employer]

## 2018-08-22 NOTE — Telephone Encounter (Signed)
Pt's husband called to d/up on this request.

## 2018-08-22 NOTE — Telephone Encounter (Signed)
Spoke with patient's husband, he states that she does have some medication at home, but dont think it will last until her appt on 8/21 for her CPE. He will count the pills and contact us, since the medication has to come from mail order it takes 7-10 days.

## 2018-08-22 NOTE — Telephone Encounter (Signed)
Called pt to let her know that she should have a refill at the pharmacy. Per husband Jasmine Camacho he states that he has contacted the pharmacy and they are saying that they don't have any refills and that have been trying to reach out to Dr. Wynetta Emery. Please advise.

## 2018-08-22 NOTE — Telephone Encounter (Addendum)
90 tabs with 1 refill given in February. She should not be out. May not have refills at pharmacy, but she should have meds until August at home. Please check with pharmacy and see when last filled.

## 2018-08-23 ENCOUNTER — Other Ambulatory Visit: Payer: Self-pay

## 2018-08-23 MED ORDER — ESZOPICLONE 3 MG PO TABS: 3.0000 mg | ORAL_TABLET | Freq: Every day | ORAL | 1 refills | Status: DC

## 2018-08-23 NOTE — Telephone Encounter (Signed)
Patient last seen 07/25/18 and has f/up 01/24/19.

## 2018-09-14 ENCOUNTER — Other Ambulatory Visit: Payer: Self-pay

## 2018-09-14 ENCOUNTER — Encounter: Payer: Self-pay | Admitting: Family Medicine

## 2018-09-14 ENCOUNTER — Ambulatory Visit (INDEPENDENT_AMBULATORY_CARE_PROVIDER_SITE_OTHER): Payer: BLUE CROSS/BLUE SHIELD | Admitting: Family Medicine

## 2018-09-14 ENCOUNTER — Ambulatory Visit: Payer: BLUE CROSS/BLUE SHIELD | Admitting: Family Medicine

## 2018-09-14 DIAGNOSIS — I10 Essential (primary) hypertension: Secondary | ICD-10-CM

## 2018-09-14 DIAGNOSIS — E0590 Thyrotoxicosis, unspecified without thyrotoxic crisis or storm: Secondary | ICD-10-CM

## 2018-09-14 DIAGNOSIS — F5101 Primary insomnia: Secondary | ICD-10-CM | POA: Diagnosis not present

## 2018-09-14 DIAGNOSIS — Z13220 Encounter for screening for lipoid disorders: Secondary | ICD-10-CM | POA: Diagnosis not present

## 2018-09-14 MED ORDER — VERAPAMIL HCL ER 240 MG PO TBCR: 240.0000 mg | EXTENDED_RELEASE_TABLET | Freq: Every day | ORAL | 1 refills | Status: DC

## 2018-09-14 NOTE — Progress Notes (Signed)
LMP  (LMP Unknown)    Subjective:    Patient ID: Jasmine Camacho, female    DOB: 09/26/1955, 63 y.o.   MRN: [id number]  HPI: Jasmine Camacho is a 63 y.o. female  Chief Complaint  Patient presents with  . Medication Refill    Tekturna, verapamil and es[facility]lone [facility] the others [facility] to [facility]  . Hypertension  . Insomnia   HYPERTENSION Hypertension status: unknown  Satisfied with current treatment? yes Duration of hypertension: chronic BP monitoring frequency:  not checking BP medication side effects:  no Medication compliance: good compliance Previous BP meds: tekturna, verapamil Aspirin: no Recurrent headaches: no Visual changes: no Palpitations: no Dyspnea: no Chest pain: no Lower extremity edema: no Dizzy/lightheaded: no  INSOMNIA Duration: chronic Satisfied with sleep quality: yes Difficulty falling asleep: no Difficulty staying asleep: no Waking a few hours after sleep onset: no Early morning awakenings: no Daytime hypersomnolence: no Wakes feeling refreshed: no Good sleep hygiene: yes Apnea: no Snoring: no Depressed/anxious mood: no Recent stress: yes Restless legs/nocturnal leg cramps: no Chronic pain/arthritis: no History of sleep study: no Treatments attempted: latuna, melatonin, uinsom and benadryl   Relevant past medical, surgical, family and social history reviewed and updated as indicated. Interim medical history since our last visit reviewed. Allergies and medications reviewed and updated.  Review of Systems  Constitutional: Negative.   Respiratory: Negative.   Cardiovascular: Negative.   Musculoskeletal: Negative.   Skin: Positive for rash. Negative for color change, pallor and wound.  Neurological: Negative.   Psychiatric/Behavioral: Positive for sleep disturbance. Negative for agitation, behavioral problems, confusion, decreased concentration, dysphoric mood, hallucinations, self-injury and suicidal ideas. The patient is  nervous/anxious. The patient is not hyperactive.     Per HPI unless specifically indicated above     Objective:    LMP  (LMP Unknown)   Wt Readings from Last 3 Encounters:  07/08/18 162 lb (73.5 kg)  06/09/18 160 lb (72.6 kg)  03/15/18 169 lb 12.8 oz (77 kg)    Physical Exam Vitals signs and nursing note reviewed.  Pulmonary:     Effort: Pulmonary effort is normal. No respiratory distress.     Comments: Speaking in full sentences Neurological:     Mental Status: She is alert.  Psychiatric:        Mood and Affect: Mood normal.        Behavior: Behavior normal.        Thought Content: Thought content normal.        Judgment: Judgment normal.     Results for orders placed or performed in visit on 11/94/17  Basic metabolic panel  Result Value Ref Range   Glucose 57 (L) 65 - 99 mg/dL   BUN 12 8 - 27 mg/dL   Creatinine, Ser 0.95 0.57 - 1.00 mg/dL   GFR calc non Af Amer 65 >59 mL/min/1.73   GFR calc Af Amer 75 >59 mL/min/1.73   BUN/Creatinine Ratio 13 12 - 28   Sodium 140 134 - 144 mmol/L   Potassium 4.5 3.5 - 5.2 mmol/L   Chloride 105 96 - 106 mmol/L   CO2 20 20 - 29 mmol/L   Calcium 9.4 8.7 - 10.3 mg/dL      Assessment & Plan:   Problem List Items Addressed This Visit      Cardiovascular and Mediastinum   Hypertension - Primary    Has been running high. Will get her in for BP check and labs and adjust medicine as  needed. Refills given today. Call with any concerns.       Relevant Medications   verapamil (CALAN-SR) 240 MG CR tablet   Other Relevant Orders   Comprehensive metabolic panel   CBC with Differential/Platelet   Microalbumin, Urine Waived   UA/M w/rflx Culture, Routine     Endocrine   Hyperthyroidism    Labs drawn today. Await results. Continue to follow with endocrinology      Relevant Orders   Comprehensive metabolic panel   CBC with Differential/Platelet   TSH   UA/M w/rflx Culture, Routine     Other   Insomnia    Under good control on  current regimen. Continue current regimen. Continue to monitor. Call with any concerns. Refills given previously for 6 months.        Relevant Orders   Comprehensive metabolic panel   CBC with Differential/Platelet   UA/M w/rflx Culture, Routine    Other Visit Diagnoses    Screening for cholesterol level       Will get labs drawn. Await results.    Relevant Orders   Lipid Panel w/o Chol/HDL Ratio       Follow up plan: Return ASAP labs and vitals- follow up determined by vitals.   . This visit was completed via telephone due to the restrictions of the COVID-19 pandemic. All issues as above were discussed and addressed but no physical exam was performed. If it was felt that the patient should be evaluated in the office, they were directed there. The patient verbally consented to this visit. Patient was unable to complete an audio/visual visit due to Lack of equipment. Due to the catastrophic nature of the COVID-19 pandemic, this visit was done through audio contact only. . Location of the patient: home . Location of the provider: home . Those involved with this call:  . Provider: Olevia Perches, DO . CMA: Tiffany Reel, CMA . Front Desk/Registration: Adela Ports  . Time spent on call: 25 minutes on the phone discussing health concerns. 40 minutes total spent in review of patient's record and preparation of their chart.

## 2018-09-14 NOTE — Assessment & Plan Note (Signed)
Labs drawn today. Await results. Continue to follow with endocrinology

## 2018-09-14 NOTE — Assessment & Plan Note (Signed)
Under good control on current regimen. Continue current regimen. Continue to monitor. Call with any concerns. Refills given previously for 6 months.

## 2018-09-14 NOTE — Assessment & Plan Note (Signed)
Has been running high. Will get her in for BP check and labs and adjust medicine as needed. Refills given today. Call with any concerns.

## 2018-09-30 ENCOUNTER — Encounter: Payer: Self-pay | Admitting: Family Medicine

## 2018-11-14 ENCOUNTER — Other Ambulatory Visit: Payer: Self-pay | Admitting: Family Medicine

## 2018-11-17 ENCOUNTER — Inpatient Hospital Stay
Admission: EM | Admit: 2018-11-17 | Discharge: 2018-11-19 | DRG: 193 | Disposition: A | Discharge status: Home / Self Care | Payer: BLUE CROSS/BLUE SHIELD | Attending: Internal Medicine | Admitting: Internal Medicine

## 2018-11-17 ENCOUNTER — Ambulatory Visit: Payer: Self-pay

## 2018-11-17 ENCOUNTER — Other Ambulatory Visit: Payer: Self-pay

## 2018-11-17 ENCOUNTER — Emergency Department: Payer: BLUE CROSS/BLUE SHIELD

## 2018-11-17 ENCOUNTER — Inpatient Hospital Stay: Payer: BLUE CROSS/BLUE SHIELD

## 2018-11-17 DIAGNOSIS — Z8673 Personal history of transient ischemic attack (TIA), and cerebral infarction without residual deficits: Secondary | ICD-10-CM | POA: Diagnosis not present

## 2018-11-17 DIAGNOSIS — Z20828 Contact with and (suspected) exposure to other viral communicable diseases: Secondary | ICD-10-CM | POA: Diagnosis present

## 2018-11-17 DIAGNOSIS — R0602 Shortness of breath: Secondary | ICD-10-CM | POA: Diagnosis present

## 2018-11-17 DIAGNOSIS — Z800 Family history of malignant neoplasm of digestive organs: Secondary | ICD-10-CM | POA: Diagnosis not present

## 2018-11-17 DIAGNOSIS — J440 Chronic obstructive pulmonary disease with acute lower respiratory infection: Secondary | ICD-10-CM | POA: Diagnosis present

## 2018-11-17 DIAGNOSIS — I361 Nonrheumatic tricuspid (valve) insufficiency: Secondary | ICD-10-CM | POA: Diagnosis not present

## 2018-11-17 DIAGNOSIS — I110 Hypertensive heart disease with heart failure: Secondary | ICD-10-CM | POA: Diagnosis present

## 2018-11-17 DIAGNOSIS — I5023 Acute on chronic systolic (congestive) heart failure: Secondary | ICD-10-CM | POA: Diagnosis present

## 2018-11-17 DIAGNOSIS — F17210 Nicotine dependence, cigarettes, uncomplicated: Secondary | ICD-10-CM | POA: Diagnosis present

## 2018-11-17 DIAGNOSIS — Z885 Allergy status to narcotic agent status: Secondary | ICD-10-CM | POA: Diagnosis not present

## 2018-11-17 DIAGNOSIS — J189 Pneumonia, unspecified organism: Principal | ICD-10-CM | POA: Diagnosis present

## 2018-11-17 DIAGNOSIS — Z8249 Family history of ischemic heart disease and other diseases of the circulatory system: Secondary | ICD-10-CM | POA: Diagnosis not present

## 2018-11-17 DIAGNOSIS — Z888 Allergy status to other drugs, medicaments and biological substances status: Secondary | ICD-10-CM

## 2018-11-17 DIAGNOSIS — R7989 Other specified abnormal findings of blood chemistry: Secondary | ICD-10-CM

## 2018-11-17 DIAGNOSIS — J441 Chronic obstructive pulmonary disease with (acute) exacerbation: Secondary | ICD-10-CM | POA: Diagnosis present

## 2018-11-17 DIAGNOSIS — I429 Cardiomyopathy, unspecified: Secondary | ICD-10-CM | POA: Diagnosis present

## 2018-11-17 DIAGNOSIS — E039 Hypothyroidism, unspecified: Secondary | ICD-10-CM | POA: Diagnosis present

## 2018-11-17 DIAGNOSIS — J9601 Acute respiratory failure with hypoxia: Secondary | ICD-10-CM | POA: Diagnosis present

## 2018-11-17 DIAGNOSIS — R4701 Aphasia: Secondary | ICD-10-CM | POA: Diagnosis present

## 2018-11-17 DIAGNOSIS — E875 Hyperkalemia: Secondary | ICD-10-CM | POA: Diagnosis present

## 2018-11-17 DIAGNOSIS — I493 Ventricular premature depolarization: Secondary | ICD-10-CM | POA: Diagnosis present

## 2018-11-17 DIAGNOSIS — Z811 Family history of alcohol abuse and dependence: Secondary | ICD-10-CM | POA: Diagnosis not present

## 2018-11-17 DIAGNOSIS — Z833 Family history of diabetes mellitus: Secondary | ICD-10-CM

## 2018-11-17 DIAGNOSIS — J9600 Acute respiratory failure, unspecified whether with hypoxia or hypercapnia: Secondary | ICD-10-CM

## 2018-11-17 DIAGNOSIS — E0500 Thyrotoxicosis with diffuse goiter without thyrotoxic crisis or storm: Secondary | ICD-10-CM | POA: Diagnosis present

## 2018-11-17 DIAGNOSIS — Z823 Family history of stroke: Secondary | ICD-10-CM | POA: Diagnosis not present

## 2018-11-17 DIAGNOSIS — Z79899 Other long term (current) drug therapy: Secondary | ICD-10-CM

## 2018-11-17 DIAGNOSIS — I340 Nonrheumatic mitral (valve) insufficiency: Secondary | ICD-10-CM | POA: Diagnosis not present

## 2018-11-17 DIAGNOSIS — I428 Other cardiomyopathies: Secondary | ICD-10-CM | POA: Diagnosis not present

## 2018-11-17 DIAGNOSIS — I10 Essential (primary) hypertension: Secondary | ICD-10-CM | POA: Diagnosis not present

## 2018-11-17 DIAGNOSIS — R778 Other specified abnormalities of plasma proteins: Secondary | ICD-10-CM

## 2018-11-17 HISTORY — DX: Acute respiratory failure, unspecified whether with hypoxia or hypercapnia: J96.00

## 2018-11-17 LAB — CBC WITH DIFFERENTIAL/PLATELET
Abs Immature Granulocytes: 0.08 10*3/uL — ABNORMAL HIGH (ref 0.00–0.07)
Basophils Absolute: 0 10*3/uL (ref 0.0–0.1)
Basophils Relative: 1 %
Eosinophils Absolute: 0 10*3/uL (ref 0.0–0.5)
Eosinophils Relative: 0 %
HCT: 31.1 % — ABNORMAL LOW (ref 36.0–46.0)
Hemoglobin: 10.2 g/dL — ABNORMAL LOW (ref 12.0–15.0)
Immature Granulocytes: 1 %
Lymphocytes Relative: 20 %
Lymphs Abs: 1.4 10*3/uL (ref 0.7–4.0)
MCH: 29.7 pg (ref 26.0–34.0)
MCHC: 32.8 g/dL (ref 30.0–36.0)
MCV: 90.7 fL (ref 80.0–100.0)
Monocytes Absolute: 0.5 10*3/uL (ref 0.1–1.0)
Monocytes Relative: 7 %
Neutro Abs: 5.2 10*3/uL (ref 1.7–7.7)
Neutrophils Relative %: 71 %
Platelets: 349 10*3/uL (ref 150–400)
RBC: 3.43 MIL/uL — ABNORMAL LOW (ref 3.87–5.11)
RDW: 16.5 % — ABNORMAL HIGH (ref 11.5–15.5)
WBC: 7.2 10*3/uL (ref 4.0–10.5)
nRBC: 0 % (ref 0.0–0.2)

## 2018-11-17 LAB — SARS CORONAVIRUS 2 BY RT PCR (HOSPITAL ORDER, PERFORMED IN ~~LOC~~ HOSPITAL LAB)
SARS Coronavirus 2: NEGATIVE
SARS Coronavirus 2: NEGATIVE

## 2018-11-17 LAB — COMPREHENSIVE METABOLIC PANEL
ALT: 49 U/L — ABNORMAL HIGH (ref 0–44)
AST: 32 U/L (ref 15–41)
Albumin: 3.9 g/dL (ref 3.5–5.0)
Alkaline Phosphatase: 123 U/L (ref 38–126)
Anion gap: 10 (ref 5–15)
BUN: 28 mg/dL — ABNORMAL HIGH (ref 8–23)
CO2: 23 mmol/L (ref 22–32)
Calcium: 9.2 mg/dL (ref 8.9–10.3)
Chloride: 102 mmol/L (ref 98–111)
Creatinine, Ser: 0.82 mg/dL (ref 0.44–1.00)
GFR calc Af Amer: >60 mL/min (ref >60)
GFR calc non Af Amer: >60 mL/min (ref >60)
Glucose, Bld: 161 mg/dL — ABNORMAL HIGH (ref 70–99)
Potassium: 4.3 mmol/L (ref 3.5–5.1)
Sodium: 135 mmol/L (ref 135–145)
Total Bilirubin: 0.8 mg/dL (ref 0.3–1.2)
Total Protein: 7.7 g/dL (ref 6.5–8.1)

## 2018-11-17 LAB — TROPONIN I (HIGH SENSITIVITY)
Troponin I (High Sensitivity): 46 ng/L — ABNORMAL HIGH (ref <18)
Troponin I (High Sensitivity): 49 ng/L — ABNORMAL HIGH (ref <18)

## 2018-11-17 LAB — LACTIC ACID, PLASMA
Lactic Acid, Venous: 2 mmol/L (ref 0.5–1.9)
Lactic Acid, Venous: 2.9 mmol/L (ref 0.5–1.9)

## 2018-11-17 LAB — URINALYSIS, COMPLETE (UACMP) WITH MICROSCOPIC
Bacteria, UA: NONE SEEN
Bilirubin Urine: NEGATIVE
Glucose, UA: NEGATIVE mg/dL
Hgb urine dipstick: NEGATIVE
Ketones, ur: NEGATIVE mg/dL
Leukocytes,Ua: NEGATIVE
Nitrite: NEGATIVE
Protein, ur: 30 mg/dL — AB
Specific Gravity, Urine: >1.046 — ABNORMAL HIGH (ref 1.005–1.030)
pH: 5 (ref 5.0–8.0)

## 2018-11-17 LAB — PROCALCITONIN: Procalcitonin: <0.1 ng/mL

## 2018-11-17 LAB — FIBRIN DERIVATIVES D-DIMER (ARMC ONLY): Fibrin derivatives D-dimer (ARMC): 2565.94 ng/mL (FEU) — ABNORMAL HIGH (ref 0.00–499.00)

## 2018-11-17 LAB — BRAIN NATRIURETIC PEPTIDE: B Natriuretic Peptide: 4252 pg/mL — ABNORMAL HIGH (ref 0.0–100.0)

## 2018-11-17 MED ORDER — SODIUM CHLORIDE 0.9 % IV SOLN
INTRAVENOUS | Status: DC | PRN
  Administered 2018-11-17 – 2018-11-19 (×2): 250 mL via INTRAVENOUS

## 2018-11-17 MED ORDER — IOHEXOL 350 MG/ML SOLN
75.0000 mL | Freq: Once | INTRAVENOUS | Status: AC | PRN
  Administered 2018-11-17: 15:00:00 75 mL via INTRAVENOUS

## 2018-11-17 MED ORDER — HYDRALAZINE HCL 20 MG/ML IJ SOLN
10.0000 mg | Freq: Four times a day (QID) | INTRAMUSCULAR | Status: DC | PRN
  Administered 2018-11-17 – 2018-11-19 (×2): 10 mg via INTRAVENOUS
  Filled 2018-11-17 (×2): qty 1

## 2018-11-17 MED ORDER — NICOTINE 21 MG/24HR TD PT24
21.0000 mg | MEDICATED_PATCH | Freq: Every day | TRANSDERMAL | Status: DC
  Administered 2018-11-17 – 2018-11-19 (×3): 21 mg via TRANSDERMAL
  Filled 2018-11-17 (×3): qty 1

## 2018-11-17 MED ORDER — ZOLPIDEM TARTRATE 5 MG PO TABS
5.0000 mg | ORAL_TABLET | Freq: Every evening | ORAL | Status: DC | PRN
  Administered 2018-11-17 – 2018-11-18 (×2): 5 mg via ORAL
  Filled 2018-11-17 (×2): qty 1

## 2018-11-17 MED ORDER — SODIUM CHLORIDE 0.9% FLUSH: 3.0000 mL | INTRAVENOUS | Status: DC | PRN

## 2018-11-17 MED ORDER — ACETAMINOPHEN 325 MG PO TABS: 650.0000 mg | ORAL_TABLET | Freq: Three times a day (TID) | ORAL | Status: DC | PRN

## 2018-11-17 MED ORDER — METHYLPREDNISOLONE SODIUM SUCC 125 MG IJ SOLR
60.0000 mg | Freq: Four times a day (QID) | INTRAMUSCULAR | Status: DC
  Administered 2018-11-17 – 2018-11-18 (×4): 60 mg via INTRAVENOUS
  Filled 2018-11-17 (×4): qty 2

## 2018-11-17 MED ORDER — LABETALOL HCL 5 MG/ML IV SOLN
10.0000 mg | INTRAVENOUS | Status: DC | PRN
  Administered 2018-11-17: 21:00:00 10 mg via INTRAVENOUS
  Filled 2018-11-17: qty 4

## 2018-11-17 MED ORDER — BUDESONIDE 0.25 MG/2ML IN SUSP
0.2500 mg | Freq: Two times a day (BID) | RESPIRATORY_TRACT | Status: DC
  Administered 2018-11-18 – 2018-11-19 (×3): 0.25 mg via RESPIRATORY_TRACT
  Filled 2018-11-17 (×3): qty 2

## 2018-11-17 MED ORDER — SODIUM CHLORIDE 0.9 % IV SOLN
500.0000 mg | INTRAVENOUS | Status: DC
  Filled 2018-11-17: qty 500

## 2018-11-17 MED ORDER — SODIUM CHLORIDE 0.9% FLUSH
3.0000 mL | Freq: Two times a day (BID) | INTRAVENOUS | Status: DC
  Administered 2018-11-17 – 2018-11-19 (×4): 3 mL via INTRAVENOUS

## 2018-11-17 MED ORDER — SODIUM CHLORIDE 0.9 % IV SOLN
500.0000 mg | INTRAVENOUS | Status: DC
  Administered 2018-11-17: 22:00:00 500 mg via INTRAVENOUS
  Filled 2018-11-17: qty 500

## 2018-11-17 MED ORDER — METHIMAZOLE 5 MG PO TABS
5.0000 mg | ORAL_TABLET | Freq: Every day | ORAL | Status: DC
  Administered 2018-11-18 – 2018-11-19 (×2): 5 mg via ORAL
  Filled 2018-11-17 (×2): qty 1

## 2018-11-17 MED ORDER — SODIUM CHLORIDE 0.9 % IV SOLN: 250.0000 mL | INTRAVENOUS | Status: DC | PRN

## 2018-11-17 MED ORDER — SODIUM CHLORIDE 0.9 % IV SOLN: 2.0000 g | INTRAVENOUS | Status: DC

## 2018-11-17 MED ORDER — VERAPAMIL HCL ER 240 MG PO TBCR
240.0000 mg | EXTENDED_RELEASE_TABLET | Freq: Every day | ORAL | Status: DC
  Administered 2018-11-17 – 2018-11-18 (×2): 240 mg via ORAL
  Filled 2018-11-17 (×3): qty 1

## 2018-11-17 MED ORDER — ENOXAPARIN SODIUM 40 MG/0.4ML ~~LOC~~ SOLN
40.0000 mg | SUBCUTANEOUS | Status: DC
  Administered 2018-11-17 – 2018-11-18 (×2): 40 mg via SUBCUTANEOUS
  Filled 2018-11-17 (×2): qty 0.4

## 2018-11-17 MED ORDER — CLONIDINE HCL 0.1 MG PO TABS
0.3000 mg | ORAL_TABLET | Freq: Once | ORAL | Status: AC
  Administered 2018-11-17: 21:00:00 0.3 mg via ORAL
  Filled 2018-11-17: qty 3

## 2018-11-17 MED ORDER — IOHEXOL 350 MG/ML SOLN
60.0000 mL | Freq: Once | INTRAVENOUS | Status: AC | PRN
  Administered 2018-11-17: 19:00:00 60 mL via INTRAVENOUS

## 2018-11-17 MED ORDER — SODIUM CHLORIDE 0.9 % IV SOLN: 500.0000 mg | INTRAVENOUS | Status: DC

## 2018-11-17 MED ORDER — SODIUM CHLORIDE 0.9 % IV SOLN
1.0000 g | INTRAVENOUS | Status: DC
  Administered 2018-11-17: 20:00:00 1 g via INTRAVENOUS
  Filled 2018-11-17: qty 10

## 2018-11-17 MED ORDER — SODIUM CHLORIDE 0.9 % IV SOLN
2.0000 g | INTRAVENOUS | Status: DC
  Administered 2018-11-18 – 2018-11-19 (×2): 2 g via INTRAVENOUS
  Filled 2018-11-17 (×2): qty 20
  Filled 2018-11-17: qty 2
  Filled 2018-11-17: qty 20

## 2018-11-17 MED ORDER — IPRATROPIUM-ALBUTEROL 0.5-2.5 (3) MG/3ML IN SOLN
3.0000 mL | RESPIRATORY_TRACT | Status: DC
  Administered 2018-11-17 – 2018-11-18 (×7): 3 mL via RESPIRATORY_TRACT
  Filled 2018-11-17 (×8): qty 3

## 2018-11-17 NOTE — ED Notes (Signed)
Dr. Cinda Quest informed of critical lactic

## 2018-11-17 NOTE — H&P (Signed)
[facility] - [place] at [facility]   PATIENT NAME: Jasmine Camacho    MR#:  [id number]  DATE OF BIRTH:  [birth year]  DATE OF ADMISSION:  11/17/2018  PRIMARY CARE PHYSICIAN: Dorcas Carrow, DO   REQUESTING/REFERRING PHYSICIAN:  Arnaldo Natal, MD     CHIEF COMPLAINT:   Chief Complaint  Patient presents with  . Shortness of Breath    HISTORY OF PRESENT ILLNESS: Jasmine Camacho  is a 63 y.o. female with a known history of multiple medical problems including hypertension, hypothyroidism and nicotine abuse who is presenting to the hospital with shortness of breath.  Patient states that her symptoms started since last week and has progressively gotten worse.  She complains of dry cough.  Denies any fevers or chills.  Patient had a CT scan of the chest in the emergency room which was negative for pulmonary embolism.  Showed atypical pneumonia.  PAST MEDICAL HISTORY:   Past Medical History:  Diagnosis Date  . Aphasia   . Hypertension   . Insomnia   . Menopausal symptom   . Menorrhagia   . Pneumonia   . Seasonal allergies   . Tachycardia   . Thyroid disease   . Ventricular premature contractions     PAST SURGICAL HISTORY:  Past Surgical History:  Procedure Laterality Date  . aneurysm surgery  09/2003  . DILATION AND CURETTAGE OF UTERUS  2007    SOCIAL HISTORY:  Social History   Tobacco Use  . Smoking status: Current Every Day Smoker    Packs/day: 0.50    Types: Cigarettes  . Smokeless tobacco: Never Used  Substance Use Topics  . Alcohol use: No    Alcohol/week: 0.0 standard drinks    FAMILY HISTORY:  Family History  Problem Relation Age of Onset  . Stroke Mother   . Diabetes Mother   . Heart disease Mother   . Hypertension Mother   . Alcohol abuse Father   . Cancer Father        liver  . Diabetes Brother   . Hypertension Brother   . Heart disease Brother   . Cancer Brother        colon    DRUG ALLERGIES:  Allergies  Allergen Reactions  .  Ambien [Zolpidem]   . Asa [Aspirin]   . Benazepril Hcl   . Codeine Sulfate Nausea Only  . Metoprolol Tartrate   . Other Other (See Comments)    Valtura  . Hctz [Hydrochlorothiazide] Rash    REVIEW OF SYSTEMS:   CONSTITUTIONAL: No fever, fatigue or weakness.  EYES: No blurred or double vision.  EARS, NOSE, AND THROAT: No tinnitus or ear pain.  RESPIRATORY: Positive cough, positive shortness of breath, positive wheezing or hemoptysis.  CARDIOVASCULAR: No chest pain, orthopnea, edema.  GASTROINTESTINAL: No nausea, vomiting, diarrhea or abdominal pain.  GENITOURINARY: No dysuria, hematuria.  ENDOCRINE: No polyuria, nocturia,  HEMATOLOGY: No anemia, easy bruising or bleeding SKIN: No rash or lesion. MUSCULOSKELETAL: No joint pain or arthritis.   NEUROLOGIC: No tingling, numbness, weakness.  PSYCHIATRY: No anxiety or depression.   MEDICATIONS AT HOME:  Prior to Admission medications   Medication Sig Start Date End Date Taking? Authorizing Provider  acetaminophen (TYLENOL 8 HOUR ARTHRITIS PAIN) 650 MG CR tablet Take 650 mg by mouth every 8 (eight) hours as needed (pain or fever).    Yes [provider]  albuterol (VENTOLIN HFA) 108 (90 Base) MCG/ACT inhaler INL 2 PFS PO Q 4 TO 6 H  PRN Patient taking differently: Inhale 2 puffs into the lungs every 4 (four) hours as needed for wheezing or shortness of breath.  07/08/18  Yes Johnson, Megan P, DO  Eszopiclone 3 MG TABS Take 1 tablet (3 mg total) by mouth at bedtime. Take immediately before bedtime 08/23/18  Yes Johnson, Megan P, DO  methimazole (TAPAZOLE) 5 MG tablet Take 5 mg by mouth daily.    Yes [provider]  verapamil (CALAN-SR) 240 MG CR tablet TAKE 1 TABLET BY MOUTH DAILY Patient taking differently: Take 240 mg by mouth at bedtime.  11/14/18  Yes Johnson, Megan P, DO  aliskiren (TEKTURNA) 300 MG tablet TAKE 1 TABLET BY MOUTH EVERY DAY Patient not taking: Reported on 11/17/2018 07/15/18   Olevia Perches P, DO       PHYSICAL EXAMINATION:   VITAL SIGNS: Blood pressure (!) 158/97, pulse (!) 105, temperature 98 F (36.7 C), temperature source Oral, resp. rate (!) 25, height 5\' 4"  (1.626 m), weight 73.9 kg, SpO2 96 %.  GENERAL:  63 y.o.-year-old patient lying in the bed with no acute distress.  EYES: Pupils equal, round, reactive to light and accommodation. No scleral icterus. Extraocular muscles intact.  HEENT: Head atraumatic, normocephalic. Oropharynx and nasopharynx clear.  NECK:  Supple, no jugular venous distention. No thyroid enlargement, no tenderness.  LUNGS: Rhonchus breath sounds bilaterally without accessory muscle usage CARDIOVASCULAR: S1, S2 normal. No murmurs, rubs, or gallops.  ABDOMEN: Soft, nontender, nondistended. Bowel sounds present. No organomegaly or mass.  EXTREMITIES: No pedal edema, cyanosis, or clubbing.  NEUROLOGIC: Cranial nerves II through XII are intact. Muscle strength 5/5 in all extremities. Sensation intact. Gait not checked.  PSYCHIATRIC: The patient is alert and oriented x 3.  SKIN: No obvious rash, lesion, or ulcer.   LABORATORY PANEL:   CBC Recent Labs  Lab 11/17/18 1311  WBC 7.2  HGB 10.2*  HCT 31.1*  PLT 349  MCV 90.7  MCH 29.7  MCHC 32.8  RDW 16.5*  LYMPHSABS 1.4  MONOABS 0.5  EOSABS 0.0  BASOSABS 0.0   ------------------------------------------------------------------------------------------------------------------  Chemistries  Recent Labs  Lab 11/17/18 1311  NA 135  K 4.3  CL 102  CO2 23  GLUCOSE 161*  BUN 28*  CREATININE 0.82  CALCIUM 9.2  AST 32  ALT 49*  ALKPHOS 123  BILITOT 0.8   ------------------------------------------------------------------------------------------------------------------ estimated creatinine clearance is 70.1 mL/min (by C-G formula based on SCr of 0.82 mg/dL). ------------------------------------------------------------------------------------------------------------------ No results for input(s): TSH,  T4TOTAL, T3FREE, THYROIDAB in the last 72 hours.  Invalid input(s): FREET3   Coagulation profile No results for input(s): INR, PROTIME in the last 168 hours. ------------------------------------------------------------------------------------------------------------------- No results for input(s): DDIMER in the last 72 hours. -------------------------------------------------------------------------------------------------------------------  Cardiac Enzymes No results for input(s): CKMB, TROPONINI, MYOGLOBIN in the last 168 hours.  Invalid input(s): CK ------------------------------------------------------------------------------------------------------------------ Invalid input(s): POCBNP  ---------------------------------------------------------------------------------------------------------------  Urinalysis No results found for: COLORURINE, APPEARANCEUR, LABSPEC, PHURINE, GLUCOSEU, HGBUR, BILIRUBINUR, KETONESUR, PROTEINUR, UROBILINOGEN, NITRITE, LEUKOCYTESUR   RADIOLOGY: Dg Chest Portable 1 View  Result Date: 11/17/2018 CLINICAL DATA:  Shortness of breath EXAM: PORTABLE CHEST 1 VIEW COMPARISON:  07/12/2018 FINDINGS: Cardiomegaly. There is subtle bilateral interstitial and heterogeneous airspace opacity, most conspicuous in the left midlung. There are probable small bilateral pleural effusions. The visualized skeletal structures are unremarkable. IMPRESSION: Cardiomegaly. There is subtle bilateral interstitial and heterogeneous airspace opacity, most conspicuous in the left midlung. There are probable small bilateral pleural effusions. Findings are most consistent with edema although infection is a differential consideration. Electronically  Signed   By: Eddie Candle M.D.   On: 11/17/2018 13:56    EKG: Orders placed or performed during the hospital encounter of 11/17/18  . EKG 12-Lead  . EKG 12-Lead  . ED EKG  . ED EKG    IMPRESSION AND PLAN: Patient is a 63 year old white  female with history of hypertension, hyperthyroidism who is presenting to the hospital with complaints of shortness of breath  1.  Acute respiratory failure due to atypical pneumonia we will treat with IV ceftriaxone azithromycin I will ask pulmonary to see tomorrow We will treat with nebs steroids  2.  Hyperthyrodism -continue Tapazole Check TSH  3.  Hypertension continue Calan  4.  Nicotine abuse smoking cessation provided 4 minutes spent strongly recommend she stop smoking nicotine patch will be started All the records are reviewed and case discussed with ED provider. Management plans discussed with the patient, family and they are in agreement.  CODE STATUS: Full code    TOTAL TIME TAKING CARE OF THIS PATIENT: 55 minutes.    Dustin Flock M.D on 11/17/2018 at 4:49 PM  Between 7am to 6pm - Pager - [phone]  After 6pm go to [email or web address] - Proofreader  [facility] Office  [phone]  CC: Primary care physician; Valerie Roys, DO

## 2018-11-17 NOTE — Telephone Encounter (Addendum)
Needs to go to UC or ER- please make sure she goes

## 2018-11-17 NOTE — ED Notes (Signed)
Dr. Cinda Quest informed that pt refused CT scan

## 2018-11-17 NOTE — Telephone Encounter (Signed)
   Reason for Disposition . [1] MODERATE difficulty breathing (e.g., speaks in phrases, SOB even at rest, pulse 100-120) AND [2] NEW-onset or WORSE than normal  Answer Assessment - Initial Assessment Questions 1. RESPIRATORY STATUS: "Describe your breathing?" (e.g., wheezing, shortness of breath, unable to speak, severe coughing)      SOB 2. ONSET: "When did this breathing problem begin?"      Last  3. PATTERN "Does the difficult breathing come and go, or has it been constant since it started?"    Off and on  4. SEVERITY: "How bad is your breathing?" (e.g., mild, moderate, severe)    - MILD: No SOB at rest, mild SOB with walking, speaks normally in sentences, can lay down, no retractions, pulse < 100.    - MODERATE: SOB at rest, SOB with minimal exertion and prefers to sit, cannot lie down flat, speaks in phrases, mild retractions, audible wheezing, pulse 100-120.    - SEVERE: Very SOB at rest, speaks in single words, struggling to breathe, sitting hunched forward, retractions, pulse > 120      moderate 5. RECURRENT SYMPTOM: "Have you had difficulty breathing before?" If so, ask: "When was the last time?" and "What happened that time?"       Yes had pneumonia 6. CARDIAC HISTORY: "Do you have any history of heart disease?" (e.g., heart attack, angina, bypass surgery, angioplasty)      denies 7. LUNG HISTORY: "Do you have any history of lung disease?"  (e.g., pulmonary embolus, asthma, emphysema)    pneumonia 8. CAUSE: "What do you think is causing the breathing problem?"       9. OTHER SYMPTOMS: "Do you have any other symptoms? (e.g., dizziness, runny nose, cough, chest pain, fever)     10. PREGNANCY: "Is there any chance you are pregnant?" "When was your last menstrual period?"       na 11. TRAVEL: "Have you traveled out of the country in the last month?" (e.g., travel history, exposures)       denies  Protocols used: BREATHING DIFFICULTY-A-AH

## 2018-11-17 NOTE — Consult Note (Signed)
Pulmonary Medicine          Date: 11/17/2018,   MRN# [id number] Jasmine Camacho 08-01-1955     AdmissionWeight: 73.9 kg                 CurrentWeight: 73.9 kg      CHIEF COMPLAINT:   Community-acquired pneumonia   HISTORY OF PRESENT ILLNESS   This is a pleasant 63 year old female with a history of aphasia essential hypertension, insomnia, menopausal symptoms, menorrhagia, tachycardia, thyroid disease, ventricular premature contractions, tobacco smoking who came into the ED with worsening shortness of breath over the past 1 week.  She reports that her shortness of breath is been getting worse and finally she decided to come in to the ED. She has a hx of brain aneurysm s/p clipping. Had CAP 3 months went to [facility] and took antibiotics with prednisone which worked for her.   Due to acute complaints of shortness of breath she had a CT PE study which was done which was negative for any venous pulmonary thromboembolism.  Her CT chest did however show bilateral groundglass opacification which was diffuse as well as a left upper lobe hazy opacity suggestive of possible atypical pneumonia as well as reactive lymphadenopathy.  She was admitted to the hospitalist service and pulmonology was consulted by Dr. Allena Katz for additional evaluation due to abnormal CT chest suggestive of atypical process.  Blood work did reveal a mild elevation of high-sensitivity troponin as well as a lactate elevation at 2.9, CBC was without leukocytosis and mild normocytic anemia, her BNP was severely elevated at 4250.0, and her complete metabolic profile shows mild elevation of ALT with remainder essentially in reference range.   PAST MEDICAL HISTORY   Past Medical History:  Diagnosis Date   Aphasia    Hypertension    Insomnia    Menopausal symptom    Menorrhagia    Pneumonia    Seasonal allergies    Tachycardia    Thyroid disease    Ventricular premature contractions      SURGICAL  HISTORY   Past Surgical History:  Procedure Laterality Date   aneurysm surgery  09/2003   DILATION AND CURETTAGE OF UTERUS  2007     FAMILY HISTORY   Family History  Problem Relation Age of Onset   Stroke Mother    Diabetes Mother    Heart disease Mother    Hypertension Mother    Alcohol abuse Father    Cancer Father        liver   Diabetes Brother    Hypertension Brother    Heart disease Brother    Cancer Brother        colon     SOCIAL HISTORY   Social History   Tobacco Use   Smoking status: Current Every Day Smoker    Packs/day: 0.50    Types: Cigarettes   Smokeless tobacco: Never Used  Substance Use Topics   Alcohol use: No    Alcohol/week: 0.0 standard drinks   Drug use: No     MEDICATIONS    Home Medication:  Current Outpatient Rx   Order #: [id number] Class: Historical Med   Order #: [id number] Class: Normal   Order #: [id number] Class: Normal   Order #: [id number] Class: Historical Med   Order #: [id number] Class: Normal   Order #: [id number] Class: Normal    Current Medication:  Current Facility-Administered Medications:    budesonide (PULMICORT) nebulizer solution 0.25 mg, 0.25 mg, Nebulization, BID, Allena Katz, [place],  MD   hydrALAZINE (APRESOLINE) injection 10 mg, 10 mg, Intravenous, Q6H PRN, Auburn Bilberry, MD, 10 mg at 11/17/18 1743   ipratropium-albuterol (DUONEB) 0.5-2.5 (3) MG/3ML nebulizer solution 3 mL, 3 mL, Nebulization, Q4H, Auburn Bilberry, MD   methylPREDNISolone sodium succinate (SOLU-MEDROL) 125 mg/2 mL injection 60 mg, 60 mg, Intravenous, Q6H, Auburn Bilberry, MD, 60 mg at 11/17/18 1743   nicotine (NICODERM CQ - dosed in mg/24 hours) patch 21 mg, 21 mg, Transdermal, Daily, Auburn Bilberry, MD  Current Outpatient Medications:    acetaminophen (TYLENOL 8 HOUR ARTHRITIS PAIN) 650 MG CR tablet, Take 650 mg by mouth every 8 (eight) hours as needed (pain or fever). , Disp: , Rfl:    albuterol (VENTOLIN HFA) 108 (90  Base) MCG/ACT inhaler, INL 2 PFS PO Q 4 TO 6 H PRN (Patient taking differently: Inhale 2 puffs into the lungs every 4 (four) hours as needed for wheezing or shortness of breath. ), Disp: 3 Inhaler, Rfl: 0   Eszopiclone 3 MG TABS, Take 1 tablet (3 mg total) by mouth at bedtime. Take immediately before bedtime, Disp: 90 tablet, Rfl: 1   methimazole (TAPAZOLE) 5 MG tablet, Take 5 mg by mouth daily. , Disp: , Rfl:    verapamil (CALAN-SR) 240 MG CR tablet, TAKE 1 TABLET BY MOUTH DAILY (Patient taking differently: Take 240 mg by mouth at bedtime. ), Disp: 90 tablet, Rfl: 1   aliskiren (TEKTURNA) 300 MG tablet, TAKE 1 TABLET BY MOUTH EVERY DAY (Patient not taking: Reported on 11/17/2018), Disp: 90 tablet, Rfl: 1    ALLERGIES   Ambien [zolpidem], Asa [aspirin], Benazepril hcl, Codeine sulfate, Metoprolol tartrate, Other, and Hctz [hydrochlorothiazide]     REVIEW OF SYSTEMS    Review of Systems:  Gen:  Denies  fever, sweats, chills weigh loss  HEENT: Denies blurred vision, double vision, ear pain, eye pain, hearing loss, nose bleeds, sore throat Cardiac:  No dizziness, chest pain or heaviness, chest tightness,edema Resp:   Denies cough or sputum porduction, shortness of breath,wheezing, hemoptysis,  Gi: Denies swallowing difficulty, stomach pain, nausea or vomiting, diarrhea, constipation, bowel incontinence Gu:  Denies bladder incontinence, burning urine Ext:   Denies Joint pain, stiffness or swelling Skin: Denies  skin rash, easy bruising or bleeding or hives Endoc:  Denies polyuria, polydipsia , polyphagia or weight change Psych:   Denies depression, insomnia or hallucinations   Other:  All other systems negative   VS: BP (!) 151/108    Pulse (!) 111    Temp 98 F (36.7 C) (Oral)    Resp (!) 31    Ht 5\' 4"  (1.626 m)    Wt 73.9 kg    LMP  (LMP Unknown)    SpO2 92%    BMI 27.98 kg/m      PHYSICAL EXAM    GENERAL:NAD, no fevers, chills, no weakness no fatigue HEAD:  Normocephalic, atraumatic.  EYES: Pupils equal, round, reactive to light. Extraocular muscles intact. No scleral icterus.  MOUTH: Moist mucosal membrane. Dentition intact. No abscess noted.  EAR, NOSE, THROAT: Clear without exudates. No external lesions.  NECK: Supple. No thyromegaly. No nodules. No JVD.  PULMONARY:bilateral crackles CARDIOVASCULAR: S1 and S2. Regular rate and rhythm. No murmurs, rubs, or gallops. No edema. Pedal pulses 2+ bilaterally.  GASTROINTESTINAL: Soft, nontender, nondistended. No masses. Positive bowel sounds. No hepatosplenomegaly.  MUSCULOSKELETAL: No swelling, clubbing, or edema. Range of motion full in all extremities.  NEUROLOGIC: Cranial nerves II through XII are intact. No gross focal neurological deficits.  Sensation intact. Reflexes intact.  SKIN: No ulceration, lesions, rashes, or cyanosis. Skin warm and dry. Turgor intact.  PSYCHIATRIC: Mood, affect within normal limits. The patient is awake, alert and oriented x 3. Insight, judgment intact.       IMAGING    Ct Angio Chest Pe W And/or Wo Contrast  Result Date: 11/17/2018 CLINICAL DATA:  Intermediate probability for pulmonary embolism. Elevated D-dimer with shortness of breath 1 week. Negative COVID-19 test 1 week ago. EXAM: CT ANGIOGRAPHY CHEST WITH CONTRAST TECHNIQUE: Multidetector CT imaging of the chest was performed using the standard protocol during bolus administration of intravenous contrast. Multiplanar CT image reconstructions and MIPs were obtained to evaluate the vascular anatomy. CONTRAST:  75mL OMNIPAQUE IOHEXOL 350 MG/ML SOLN COMPARISON:  None. FINDINGS: Cardiovascular: Mild cardiomegaly. Ascending thoracic aorta is normal in caliber as is mild ectasia of the descending thoracic aorta measuring 3.7 cm in AP diameter. Calcified atherosclerotic plaque is present over the thoracic aorta. Pulmonary arterial system is well opacified without evidence of emboli. There is calcified plaque over the left  main and 3 vessel coronary arteries. Remaining vascular structures are unremarkable. Mediastinum/Nodes: Possible low-density 1.8 cm lymph node over the precarinal region. Mild low-density lymphoid tissue over the hilar regions. Low density material over the subcarinal region likely lymphoid tissue without well-defined lymph node. Mild amount of fluid within the distal esophagus which may be due to reflux or dysmotility. Lungs/Pleura: Lungs are adequately inflated and demonstrate small bilateral pleural effusions with minimal atelectasis over the mid to lower lungs. There is subtle patchy peripheral hazy opacification bilaterally which may be due to an infectious, atypical infectious or inflammatory process. Airways are normal. Upper Abdomen: No acute findings. Calcified plaque over the abdominal aorta. Musculoskeletal: Degenerative changes of the spine. Review of the MIP images confirms the above findings. IMPRESSION: 1.  No evidence of pulmonary embolism. 2. Subtle hazy patchy bilateral peripheral airspace opacification which may be due to infectious, atypical infectious or inflammatory process. Small bilateral pleural effusions with associated bibasilar atelectasis. Mild mediastinal adenopathy likely reactive. 3. Aortic Atherosclerosis (ICD10-I70.0). Atherosclerotic coronary artery disease. Mild ectasia of the descending thoracic aorta measuring 3.7 cm. 4. Electronically Signed   By: Elberta Fortisaniel  Boyle M.D.   On: 11/17/2018 16:49   Dg Chest Portable 1 View  Result Date: 11/17/2018 CLINICAL DATA:  Shortness of breath EXAM: PORTABLE CHEST 1 VIEW COMPARISON:  07/12/2018 FINDINGS: Cardiomegaly. There is subtle bilateral interstitial and heterogeneous airspace opacity, most conspicuous in the left midlung. There are probable small bilateral pleural effusions. The visualized skeletal structures are unremarkable. IMPRESSION: Cardiomegaly. There is subtle bilateral interstitial and heterogeneous airspace opacity, most  conspicuous in the left midlung. There are probable small bilateral pleural effusions. Findings are most consistent with edema although infection is a differential consideration. Electronically Signed   By: Lauralyn PrimesAlex  Bibbey M.D.   On: 11/17/2018 13:56      ASSESSMENT/PLAN   Acute hypoxemic respiratory failure  - patient reports fevers, cough now has ground glass opacification  - will order RVP, should retest her COVID swab   - she is feeling very short of breath on 5Lnc but SpO2 is 95%  - BNP is severely elevated , will order TTE, no peripheral edema, she had previous stroke possible BNP elevation from aneurysm will perform stat CTH with contrast ( hx of brain aneurysm)  -agree with empiric abx and solumedrol     COPD with acute exacerbation   - Continue with nebulizer therapy q6h  -solumedrol   -  empiric antibiotics - zithromax and rocephin     Tobacco abuse  - smoking cessation counseling       Thank you for allowing me to participate in the care of this patient.   Patient/Family are satisfied with care plan and all questions have been answered.  This document was prepared using Dragon voice recognition software and may include unintentional dictation errors.     Ottie Glazier, M.D.  Division of [facility]

## 2018-11-17 NOTE — ED Notes (Signed)
Pt ambulated to toilet with steady gait, pt missed urine cup and was not able to provide urine sample

## 2018-11-17 NOTE — ED Notes (Signed)
Dinamapp BP taken per request of 2A RN reading 162/115

## 2018-11-17 NOTE — ED Notes (Signed)
Ok to eat per Dr. Cinda Quest, pt given tray and soda

## 2018-11-17 NOTE — ED Notes (Addendum)
Spoke with charge RN Annie Main and was told that they had to check with the supervisor regarding patient's BP before patient could come to the floor, charge RN Butch informed

## 2018-11-17 NOTE — ED Triage Notes (Signed)
Arrives to ER via [facility] from [place]. Pt there for SOB X 1 week and sent to ER for further evaluation. Pt tested for COVID X 1 week ago, negative. Pt obviously SOB on arrival, wearing 4L [place] on arrival. Pt alert, RR labored. EDP at bedside.

## 2018-11-17 NOTE — ED Provider Notes (Addendum)
[facility] Emergency Department Provider Note   ____________________________________________   First MD Initiated Contact with Patient 11/17/18 1304     (approximate)  I have reviewed the triage vital signs and the nursing notes.   HISTORY  Chief Complaint Shortness of Breath   HPI Jasmine Camacho is a 63 y.o. female who reports she had pneumonia several weeks ago.  She got better but then about Thursday began getting short of breath again.  She got worse this morning was having cold sweats.  She has a cough but is nonproductive.  She is tachypneic and tachycardic.  She has no pain in her chest with deep breathing.  No pain otherwise either.  Recent history of stroke.         Past Medical History:  Diagnosis Date  . Aphasia   . Hypertension   . Insomnia   . Menopausal symptom   . Menorrhagia   . Pneumonia   . Seasonal allergies   . Tachycardia   . Thyroid disease   . Ventricular premature contractions     Patient Active Problem List   Diagnosis Date Noted  . H/O spont intraventricular hemorrhage due to cerebral aneurysm 04/28/2016  . Aphasia 04/11/2015  . Hyperthyroidism 04/11/2015  . Insomnia 04/11/2015  . Seasonal allergic rhinitis 04/11/2015  . Ventricular premature contractions 04/11/2015  . Hypertension 04/11/2015  . Tobacco dependence 05/02/2014    Past Surgical History:  Procedure Laterality Date  . aneurysm surgery  09/2003  . DILATION AND CURETTAGE OF UTERUS  2007    Prior to Admission medications   Medication Sig Start Date End Date Taking? Authorizing Provider  Acetaminophen (TYLENOL 8 HOUR ARTHRITIS PAIN PO) Take 600 mg by mouth.    [provider]  albuterol (VENTOLIN HFA) 108 (90 Base) MCG/ACT inhaler INL 2 PFS PO Q 4 TO 6 H PRN 07/08/18   Johnson, Megan P, DO  aliskiren (TEKTURNA) 300 MG tablet TAKE 1 TABLET BY MOUTH EVERY DAY 07/15/18   Johnson, Megan P, DO  Eszopiclone 3 MG TABS Take 1 tablet (3 mg total) by  mouth at bedtime. Take immediately before bedtime 08/23/18   Johnson, Megan P, DO  methimazole (TAPAZOLE) 5 MG tablet Take 5 mg by mouth daily.     [provider]  verapamil (CALAN-SR) 240 MG CR tablet TAKE 1 TABLET BY MOUTH DAILY 11/14/18   Olevia Perches P, DO    Allergies Ambien [zolpidem], Asa [aspirin], Benazepril hcl, Codeine sulfate, Metoprolol tartrate, Other, and Hctz [hydrochlorothiazide]  Family History  Problem Relation Age of Onset  . Stroke Mother   . Diabetes Mother   . Heart disease Mother   . Hypertension Mother   . Alcohol abuse Father   . Cancer Father        liver  . Diabetes Brother   . Hypertension Brother   . Heart disease Brother   . Cancer Brother        colon    Social History Social History   Tobacco Use  . Smoking status: Current Every Day Smoker    Packs/day: 0.50    Types: Cigarettes  . Smokeless tobacco: Never Used  Substance Use Topics  . Alcohol use: No    Alcohol/week: 0.0 standard drinks  . Drug use: No    Review of Systems  Constitutional: No fever/chills Eyes: No visual changes. ENT: No sore throat. Cardiovascular:  chest pain. Respiratory: Denies shortness of breath. Gastrointestinal: No abdominal pain.  No nausea, no  vomiting.  No diarrhea.  No constipation. Genitourinary: Negative for dysuria. Musculoskeletal: Negative for back pain. Skin: Negative for rash. Neurological: Negative for headaches, focal weakness   ____________________________________________   PHYSICAL EXAM:  VITAL SIGNS: ED Triage Vitals  Enc Vitals Group     BP 11/17/18 1307 (!) 158/97     Pulse Rate 11/17/18 1307 (!) 112     Resp 11/17/18 1307 20     Temp --      Temp src --      SpO2 11/17/18 1307 99 %     Weight 11/17/18 1308 163 lb (73.9 kg)     Height 11/17/18 1308 5\' 4"  (1.626 m)     Head Circumference --      Peak Flow --      Pain Score 11/17/18 1307 0     Pain Loc --      Pain Edu? --      Excl. in GC? --      Constitutional: Alert and oriented. Well appearing and in no acute distress. Eyes: Conjunctivae are normal.  Head: Atraumatic. Nose: No congestion/rhinnorhea. Mouth/Throat: Mucous membranes are moist.  Oropharynx non-erythematous. Neck: No stridor.  Cardiovascular: Normal rate, regular rhythm. Grossly normal heart sounds.  Good peripheral circulation. Respiratory: Normal respiratory effort.  No retractions. Lungs crackles in the right base Gastrointestinal: Soft and nontender. No distention. No abdominal bruits.  Musculoskeletal: No lower extremity tenderness nor edema.   Neurologic: No new changes Skin:  Skin is warm, dry and intact. No rash noted.   ____________________________________________   LABS (all labs ordered are listed, but only abnormal results are displayed)  Labs Reviewed  COMPREHENSIVE METABOLIC PANEL - Abnormal; Notable for the following components:      Result Value   Glucose, Bld 161 (*)    BUN 28 (*)    ALT 49 (*)    All other components within normal limits  BRAIN NATRIURETIC PEPTIDE - Abnormal; Notable for the following components:   B Natriuretic Peptide 4,252.0 (*)    All other components within normal limits  LACTIC ACID, PLASMA - Abnormal; Notable for the following components:   Lactic Acid, Venous 2.0 (*)    All other components within normal limits  LACTIC ACID, PLASMA - Abnormal; Notable for the following components:   Lactic Acid, Venous 2.9 (*)    All other components within normal limits  CBC WITH DIFFERENTIAL/PLATELET - Abnormal; Notable for the following components:   RBC 3.43 (*)    Hemoglobin 10.2 (*)    HCT 31.1 (*)    RDW 16.5 (*)    Abs Immature Granulocytes 0.08 (*)    All other components within normal limits  FIBRIN DERIVATIVES D-DIMER ([facility] ONLY) - Abnormal; Notable for the following components:   Fibrin derivatives D-dimer [facility] (*)    All other components within normal limits  TROPONIN I (HIGH SENSITIVITY) -  Abnormal; Notable for the following components:   Troponin I (High Sensitivity) 46 (*)    All other components within normal limits  TROPONIN I (HIGH SENSITIVITY) - Abnormal; Notable for the following components:   Troponin I (High Sensitivity) 49 (*)    All other components within normal limits  SARS CORONAVIRUS 2 (HOSPITAL ORDER, PERFORMED IN [facility])  URINALYSIS, COMPLETE (UACMP) WITH MICROSCOPIC   ____________________________________________  EKG EKG read interpreted by me shows sinus tachycardia rate of 111 normal axis diffuse ST flattening and some T wave inversion laterally.  ____________________________________________  RADIOLOGY  ED  MD interpretation: Chest x-ray shows some haziness.  This could be CHF or infection.  Official radiology report(s): Dg Chest Portable 1 View  Result Date: 11/17/2018 CLINICAL DATA:  Shortness of breath EXAM: PORTABLE CHEST 1 VIEW COMPARISON:  07/12/2018 FINDINGS: Cardiomegaly. There is subtle bilateral interstitial and heterogeneous airspace opacity, most conspicuous in the left midlung. There are probable small bilateral pleural effusions. The visualized skeletal structures are unremarkable. IMPRESSION: Cardiomegaly. There is subtle bilateral interstitial and heterogeneous airspace opacity, most conspicuous in the left midlung. There are probable small bilateral pleural effusions. Findings are most consistent with edema although infection is a differential consideration. Electronically Signed   By: Eddie Candle M.D.   On: 11/17/2018 13:56    ____________________________________________   PROCEDURES  Procedure(s) performed (including Critical Care): Critical care time 40 minutes.  This includes reevaluating the patient going back and convincing her to have a CT when she told me she could not do it the first time.  Discussing her case with the hospitalist and reviewing her old records on her current studies.  Procedures    ____________________________________________   INITIAL IMPRESSION / ASSESSMENT AND PLAN / ED COURSE  Patient's lactic acid is 2.  Her chest x-ray is like there are some increased markings which could be fluid or could be coronavirus or could be an atypical pneumonia.  Her d-dimer is very high over 2000.  We will get her GFR back and CT her with contrast.  Try to find out which of these problems she may have.    Patient's repeated lactic acid is higher as is her troponin slightly higher CT is pending but in view of her labs and her continued dyspnea we will have to keep her in the hospital.    Patient's lactic acid is going up.  Her d-dimer is very high.  Chest x-ray could be CHF or pneumonia or there could be PEs going on.  Additionally until just now I was worried about COVID although now a Coban test is negative.  CT angios results are pending.  We will get this lady in the hospital and begin working on her multiple problems.      ____________________________________________   FINAL CLINICAL IMPRESSION(S) / ED DIAGNOSES  Final diagnoses:  Elevated troponin  Elevated lactic acid level  SOB (shortness of breath)  Elevated brain natriuretic peptide (BNP) level  Elevated d-dimer     ED Discharge Orders    None       Note:  This document was prepared using Dragon voice recognition software and may include unintentional dictation errors.    Nena Polio, MD 11/17/18 1623    Nena Polio, MD 11/17/18 0962    Nena Polio, MD 11/17/18 [phone]

## 2018-11-17 NOTE — Telephone Encounter (Signed)
Patient is already at the ER.

## 2018-11-17 NOTE — ED Notes (Signed)
Attempted to call report to tele RN but was told she would have to call me back

## 2018-11-17 NOTE — ED Notes (Signed)
Report given to telemetry RN 

## 2018-11-17 NOTE — ED Notes (Signed)
Attempted to call report, was told to check pt's BP with dinemapp and call back

## 2018-11-17 NOTE — Plan of Care (Signed)

## 2018-11-18 ENCOUNTER — Inpatient Hospital Stay (HOSPITAL_COMMUNITY)
Admit: 2018-11-18 | Discharge: 2018-11-18 | Disposition: A | Discharge status: Home / Self Care | Payer: BLUE CROSS/BLUE SHIELD | Attending: Pulmonary Disease | Admitting: Pulmonary Disease

## 2018-11-18 DIAGNOSIS — I340 Nonrheumatic mitral (valve) insufficiency: Secondary | ICD-10-CM

## 2018-11-18 DIAGNOSIS — I361 Nonrheumatic tricuspid (valve) insufficiency: Secondary | ICD-10-CM

## 2018-11-18 LAB — RESPIRATORY PANEL BY PCR

## 2018-11-18 LAB — LACTIC ACID, PLASMA
Lactic Acid, Venous: 1.4 mmol/L (ref 0.5–1.9)
Lactic Acid, Venous: 1.5 mmol/L (ref 0.5–1.9)

## 2018-11-18 LAB — ECHOCARDIOGRAM COMPLETE
Height: 64 in
Weight: 2534.4 oz

## 2018-11-18 LAB — STREP PNEUMONIAE URINARY ANTIGEN: Strep Pneumo Urinary Antigen: NEGATIVE

## 2018-11-18 LAB — HIV ANTIBODY (ROUTINE TESTING W REFLEX): HIV Screen 4th Generation wRfx: NONREACTIVE

## 2018-11-18 MED ORDER — AZITHROMYCIN 250 MG PO TABS
500.0000 mg | ORAL_TABLET | Freq: Every day | ORAL | Status: DC
  Administered 2018-11-18: 500 mg via ORAL
  Filled 2018-11-18: qty 2

## 2018-11-18 MED ORDER — METHYLPREDNISOLONE SODIUM SUCC 125 MG IJ SOLR
60.0000 mg | Freq: Two times a day (BID) | INTRAMUSCULAR | Status: DC
  Administered 2018-11-18 – 2018-11-19 (×2): 60 mg via INTRAVENOUS
  Filled 2018-11-18 (×2): qty 2

## 2018-11-18 MED ORDER — FUROSEMIDE 10 MG/ML IJ SOLN
40.0000 mg | Freq: Every day | INTRAMUSCULAR | Status: DC
  Administered 2018-11-18 – 2018-11-19 (×2): 40 mg via INTRAVENOUS
  Filled 2018-11-18 (×2): qty 4

## 2018-11-18 MED ORDER — IPRATROPIUM-ALBUTEROL 0.5-2.5 (3) MG/3ML IN SOLN: 3.0000 mL | RESPIRATORY_TRACT | Status: DC | PRN

## 2018-11-18 NOTE — Progress Notes (Signed)
Pulmonary Medicine          Date: 11/18/2018,   MRN# [id number] Jasmine Camacho [birth year]     AdmissionWeight: 73.9 kg                 CurrentWeight: 71.8 kg      CHIEF COMPLAINT:   Community-acquired pneumonia   SUBJECTIVE    Patient is clinically improved, she is off oxygen on room air at rest.    PAST MEDICAL HISTORY   Past Medical History:  Diagnosis Date   Aphasia    Hypertension    Insomnia    Menopausal symptom    Menorrhagia    Pneumonia    Seasonal allergies    Tachycardia    Thyroid disease    Ventricular premature contractions      SURGICAL HISTORY   Past Surgical History:  Procedure Laterality Date   aneurysm surgery  09/2003   DILATION AND CURETTAGE OF UTERUS  2007     FAMILY HISTORY   Family History  Problem Relation Age of Onset   Stroke Mother    Diabetes Mother    Heart disease Mother    Hypertension Mother    Alcohol abuse Father    Cancer Father        liver   Diabetes Brother    Hypertension Brother    Heart disease Brother    Cancer Brother        colon     SOCIAL HISTORY   Social History   Tobacco Use   Smoking status: Current Every Day Smoker    Packs/day: 0.50    Types: Cigarettes   Smokeless tobacco: Never Used  Substance Use Topics   Alcohol use: No    Alcohol/week: 0.0 standard drinks   Drug use: No     MEDICATIONS    Home Medication:    Current Medication:  Current Facility-Administered Medications:    0.9 %  sodium chloride infusion, 250 mL, Intravenous, PRN, Auburn Bilberry, MD   0.9 %  sodium chloride infusion, , Intravenous, PRN, Auburn Bilberry, MD, Last Rate: 10 mL/hr at 11/17/18 2220, 250 mL at 11/17/18 2220   acetaminophen (TYLENOL) tablet 650 mg, 650 mg, Oral, Q8H PRN, Auburn Bilberry, MD   azithromycin (ZITHROMAX) 500 mg in sodium chloride 0.9 % 250 mL IVPB, 500 mg, Intravenous, Q24H, Auburn Bilberry, MD   budesonide (PULMICORT) nebulizer  solution 0.25 mg, 0.25 mg, Nebulization, BID, Auburn Bilberry, MD, 0.25 mg at 11/18/18 0814   cefTRIAXone (ROCEPHIN) 2 g in sodium chloride 0.9 % 100 mL IVPB, 2 g, Intravenous, Q24H, Patel, Shreyang, MD   enoxaparin (LOVENOX) injection 40 mg, 40 mg, Subcutaneous, Q24H, Auburn Bilberry, MD, 40 mg at 11/17/18 2236   hydrALAZINE (APRESOLINE) injection 10 mg, 10 mg, Intravenous, Q6H PRN, Auburn Bilberry, MD, 10 mg at 11/17/18 1743   ipratropium-albuterol (DUONEB) 0.5-2.5 (3) MG/3ML nebulizer solution 3 mL, 3 mL, Nebulization, Q4H, Auburn Bilberry, MD, 3 mL at 11/18/18 0814   labetalol (NORMODYNE) injection 10 mg, 10 mg, Intravenous, Q2H PRN, Auburn Bilberry, MD, 10 mg at 11/17/18 2115   methimazole (TAPAZOLE) tablet 5 mg, 5 mg, Oral, Daily, Auburn Bilberry, MD, 5 mg at 11/18/18 0918   methylPREDNISolone sodium succinate (SOLU-MEDROL) 125 mg/2 mL injection 60 mg, 60 mg, Intravenous, Q6H, Auburn Bilberry, MD, 60 mg at 11/18/18 0601   nicotine (NICODERM CQ - dosed in mg/24 hours) patch 21 mg, 21 mg, Transdermal, Daily, Auburn Bilberry, MD, 21 mg at 11/18/18 [phone]  sodium chloride flush (NS) 0.9 % injection 3 mL, 3 mL, Intravenous, Q12H, Auburn Bilberry, MD, 3 mL at 11/18/18 0918   sodium chloride flush (NS) 0.9 % injection 3 mL, 3 mL, Intravenous, PRN, Auburn Bilberry, MD   verapamil (CALAN-SR) CR tablet 240 mg, 240 mg, Oral, QHS, Auburn Bilberry, MD, 240 mg at 11/17/18 2238   zolpidem (AMBIEN) tablet 5 mg, 5 mg, Oral, QHS PRN, Auburn Bilberry, MD, 5 mg at 11/17/18 2213    ALLERGIES   Ambien [zolpidem], Asa [aspirin], Benazepril hcl, Codeine sulfate, Metoprolol tartrate, Other, and Hctz [hydrochlorothiazide]     REVIEW OF SYSTEMS    Review of Systems:  Gen:  Denies  fever, sweats, chills weigh loss  HEENT: Denies blurred vision, double vision, ear pain, eye pain, hearing loss, nose bleeds, sore throat Cardiac:  No dizziness, chest pain or heaviness, chest tightness,edema Resp:   Reports shortness of breath,wheezing, hemoptysis,  Gi: Denies swallowing difficulty, stomach pain, nausea or vomiting, diarrhea, constipation, bowel incontinence Gu:  Denies bladder incontinence, burning urine Ext:   Denies Joint pain, stiffness or swelling Skin: Denies  skin rash, easy bruising or bleeding or hives Endoc:  Denies polyuria, polydipsia , polyphagia or weight change Psych:   Denies depression, insomnia or hallucinations   Other:  All other systems negative   VS: BP (!) 122/107 (BP Location: Left Arm)    Pulse 94    Temp 98.2 F (36.8 C) (Oral)    Resp 18    Ht 5\' 4"  (1.626 m)    Wt 71.8 kg    LMP  (LMP Unknown)    SpO2 97%    BMI 27.19 kg/m      PHYSICAL EXAM    GENERAL:NAD, no fevers, chills, no weakness no fatigue HEAD: Normocephalic, atraumatic.  EYES: Pupils equal, round, reactive to light. Extraocular muscles intact. No scleral icterus.  MOUTH: Moist mucosal membrane. Dentition intact. No abscess noted.  EAR, NOSE, THROAT: Clear without exudates. No external lesions.  NECK: Supple. No thyromegaly. No nodules. No JVD.  PULMONARY:bilateral crackles improved from yesterday CARDIOVASCULAR: S1 and S2. Regular rate and rhythm. No murmurs, rubs, or gallops. No edema. Pedal pulses 2+ bilaterally.  GASTROINTESTINAL: Soft, nontender, nondistended. No masses. Positive bowel sounds. No hepatosplenomegaly.  MUSCULOSKELETAL: No swelling, clubbing, or edema. Range of motion full in all extremities.  NEUROLOGIC: Cranial nerves II through XII are intact. No gross focal neurological deficits. Sensation intact. Reflexes intact.  SKIN: No ulceration, lesions, rashes, or cyanosis. Skin warm and dry. Turgor intact.  PSYCHIATRIC: Mood, affect within normal limits. The patient is awake, alert and oriented x 3. Insight, judgment intact.       IMAGING    Ct Angio Head W Or Wo Contrast  Result Date: 11/17/2018 CLINICAL DATA:  Intracranial hemorrhage, known, follow-up. Additional  history provided: Brain aneurysm with hemorrhage 15 years ago. Additional history obtained from ER provider as shortness of breath, COVID EXAM: CT ANGIOGRAPHY HEAD TECHNIQUE: Multidetector CT imaging of the head was performed using the standard protocol during bolus administration of intravenous contrast. Multiplanar CT image reconstructions and MIPs were obtained to evaluate the vascular anatomy. CONTRAST:  31mL OMNIPAQUE IOHEXOL 350 MG/ML SOLN COMPARISON:  Report from noncontrast head CT 09/21/2002 (images unavailable). FINDINGS: CT HEAD Brain: Embolization coil masses in the region of the distal left ICA/M1 left MCA. Streak artifact limits evaluation of surrounding structures. There are chronic regions of encephalomalacia/gliosis within the left frontal lobe, parietal lobe and posterosuperior left temporal lobe in the left  MCA vascular territory, likely reflecting chronic infarcts. Associated ex vacuo dilatation of the left lateral ventricle. There is also abnormal hypodensity within the anterior right frontal lobe periventricular white matter with associated volume loss, consistent with chronic white matter infarct. Associated ex vacuo dilatation of the right lateral ventricle frontal horn. Additional chronic appearing infarct within the left cerebellum. No evidence of acute intracranial hemorrhage. No midline shift or extra-axial fluid collection. Cerebral volume is normal for age Vascular: Reported separately Skull: No calvarial fracture or suspicious calvarial lesion Sinuses: No significant paranasal sinus disease or mastoid effusion at the imaged levels. Orbits: Visualized orbits demonstrate no acute abnormality. CTA HEAD Anterior circulation: Calcified and noncalcified atherosclerotic plaque within the intracranial right internal carotid artery. Resultant moderate/advanced stenosis within the distal cavernous/paraclinoid right ICA (series 11, image 71). Additional sites of mild stenosis more proximally  within the intracranial right ICA. Calcified and noncalcified plaque within the intracranial left internal carotid artery. Apparent moderate/advanced stenosis within the distal cavernous/paraclinoid left internal carotid artery. Additional sites of mild stenosis more proximally within the intracranial left ICA. The right middle and anterior cerebral arteries are patent without significant proximal stenosis. Streak artifact from embolization coil masses obscures the left carotid artery terminus and the proximal M1 left middle cerebral artery. The M1 left middle cerebral artery appears patent. The left anterior cerebral artery is patent without significant proximal stenosis. 2 mm aneurysm versus infundibulum projecting inferiorly from the supraclinoid right ICA (series 7, image 53). Streak artifact limits evaluation for residual/recurrent aneurysm at the site of the embolization coil masses. Posterior circulation: The intracranial vertebral arteries are patent without significant stenosis. Proximal flow is seen within the PICA vessels bilaterally. Flow is seen within the right AICA proximally. The left AICA is poorly delineated. The basilar artery is patent without significant stenosis. Flow is seen within the proximal superior cerebellar arteries bilaterally. The bilateral posterior cerebral arteries are patent without significant proximal stenosis Venous sinuses: Within limitations of contrast timing, no convincing thrombus. Anatomic variants: None significant IMPRESSION: CT head: 1. No CT evidence of acute intracranial abnormality. 2. Chronic infarcts within the left MCA vascular territory, within the right frontal lobe periventricular white matter and left cerebellum. CTA head: 1. Atherosclerotic disease within the intracranial internal carotid arteries with multifocal stenoses. Most notably, there are moderate/advanced stenoses within the distal cavernous/paraclinoid internal carotid arteries bilaterally. 2. The  left AICA is poorly delineated. This may be due to small vessel size and/or stenosis. 3. Embolization coil masses in the region of the left ICA terminus and proximal M1 left MCA. Correlate with procedural history. Associated streak artifact limits evaluation for residual/recurrent aneurysm at these sites. 4. 2 mm aneurysm versus infundibulum of the supraclinoid right ICA. Electronically Signed   By: Kellie Simmering   On: 11/17/2018 20:27   Ct Angio Chest Pe W And/or Wo Contrast  Result Date: 11/17/2018 CLINICAL DATA:  Intermediate probability for pulmonary embolism. Elevated D-dimer with shortness of breath 1 week. Negative COVID-19 test 1 week ago. EXAM: CT ANGIOGRAPHY CHEST WITH CONTRAST TECHNIQUE: Multidetector CT imaging of the chest was performed using the standard protocol during bolus administration of intravenous contrast. Multiplanar CT image reconstructions and MIPs were obtained to evaluate the vascular anatomy. CONTRAST:  40mL OMNIPAQUE IOHEXOL 350 MG/ML SOLN COMPARISON:  None. FINDINGS: Cardiovascular: Mild cardiomegaly. Ascending thoracic aorta is normal in caliber as is mild ectasia of the descending thoracic aorta measuring 3.7 cm in AP diameter. Calcified atherosclerotic plaque is present over the thoracic aorta. Pulmonary arterial  system is well opacified without evidence of emboli. There is calcified plaque over the left main and 3 vessel coronary arteries. Remaining vascular structures are unremarkable. Mediastinum/Nodes: Possible low-density 1.8 cm lymph node over the precarinal region. Mild low-density lymphoid tissue over the hilar regions. Low density material over the subcarinal region likely lymphoid tissue without well-defined lymph node. Mild amount of fluid within the distal esophagus which may be due to reflux or dysmotility. Lungs/Pleura: Lungs are adequately inflated and demonstrate small bilateral pleural effusions with minimal atelectasis over the mid to lower lungs. There is  subtle patchy peripheral hazy opacification bilaterally which may be due to an infectious, atypical infectious or inflammatory process. Airways are normal. Upper Abdomen: No acute findings. Calcified plaque over the abdominal aorta. Musculoskeletal: Degenerative changes of the spine. Review of the MIP images confirms the above findings. IMPRESSION: 1.  No evidence of pulmonary embolism. 2. Subtle hazy patchy bilateral peripheral airspace opacification which may be due to infectious, atypical infectious or inflammatory process. Small bilateral pleural effusions with associated bibasilar atelectasis. Mild mediastinal adenopathy likely reactive. 3. Aortic Atherosclerosis (ICD10-I70.0). Atherosclerotic coronary artery disease. Mild ectasia of the descending thoracic aorta measuring 3.7 cm. 4. Electronically Signed   By: Elberta Fortisaniel  Boyle M.D.   On: 11/17/2018 16:49   Dg Chest Portable 1 View  Result Date: 11/17/2018 CLINICAL DATA:  Shortness of breath EXAM: PORTABLE CHEST 1 VIEW COMPARISON:  07/12/2018 FINDINGS: Cardiomegaly. There is subtle bilateral interstitial and heterogeneous airspace opacity, most conspicuous in the left midlung. There are probable small bilateral pleural effusions. The visualized skeletal structures are unremarkable. IMPRESSION: Cardiomegaly. There is subtle bilateral interstitial and heterogeneous airspace opacity, most conspicuous in the left midlung. There are probable small bilateral pleural effusions. Findings are most consistent with edema although infection is a differential consideration. Electronically Signed   By: Lauralyn PrimesAlex  Bibbey M.D.   On: 11/17/2018 13:56      ASSESSMENT/PLAN   Acute hypoxemic respiratory failure  - patient reports fevers, cough now has ground glass opacification  - RVP and covidX2 negative   - sob improved  - BNP is severely elevated , will order TTE, no peripheral edema, she had previous stroke possible BNP elevation from aneurysm will perform stat CTH with  contrast ( hx of brain aneurysm)  -agree with empiric abx and solumedrol     COPD with acute exacerbation   - Nebulizer- wiill d/c nebs due to palpitations reported by patient  -solumedrol   - empiric antibiotics - zithromax and rocephin     Tobacco abuse  - smoking cessation counseling       Thank you for allowing me to participate in the care of this patient.   Patient/Family are satisfied with care plan and all questions have been answered.  This document was prepared using Dragon voice recognition software and may include unintentional dictation errors.     Vida RiggerFuad Wai Litt, M.D.  Division of Pulmonary & Critical Care Medicine  [facility]

## 2018-11-18 NOTE — Plan of Care (Signed)

## 2018-11-18 NOTE — Progress Notes (Signed)
*  PRELIMINARY RESULTS* Echocardiogram 2D Echocardiogram has been performed.  Jasmine Camacho 11/18/2018, 10:07 AM

## 2018-11-19 DIAGNOSIS — I428 Other cardiomyopathies: Secondary | ICD-10-CM

## 2018-11-19 DIAGNOSIS — I10 Essential (primary) hypertension: Secondary | ICD-10-CM

## 2018-11-19 DIAGNOSIS — I493 Ventricular premature depolarization: Secondary | ICD-10-CM

## 2018-11-19 LAB — DRUG PROFILE, UR, 9 DRUGS (LABCORP)
Amphetamines, Urine: NEGATIVE ng/mL
Barbiturate, Ur: NEGATIVE ng/mL
Benzodiazepine Quant, Ur: NEGATIVE ng/mL
Cannabinoid Quant, Ur: NEGATIVE ng/mL
Cocaine (Metab.): NEGATIVE ng/mL
Methadone Screen, Urine: NEGATIVE ng/mL
Opiate Quant, Ur: NEGATIVE ng/mL
Phencyclidine, Ur: NEGATIVE ng/mL
Propoxyphene, Urine: NEGATIVE ng/mL

## 2018-11-19 MED ORDER — CEFUROXIME AXETIL 250 MG PO TABS: 250.0000 mg | ORAL_TABLET | Freq: Two times a day (BID) | ORAL | 0 refills | Status: AC

## 2018-11-19 MED ORDER — AZITHROMYCIN 250 MG PO TABS: 250.0000 mg | ORAL_TABLET | Freq: Every day | ORAL | 0 refills | Status: AC

## 2018-11-19 MED ORDER — FUROSEMIDE 20 MG PO TABS: 40.0000 mg | ORAL_TABLET | Freq: Every day | ORAL | 0 refills | Status: DC

## 2018-11-19 MED ORDER — COMBIVENT RESPIMAT 20-100 MCG/ACT IN AERS: 1.0000 | INHALATION_SPRAY | Freq: Four times a day (QID) | RESPIRATORY_TRACT | 1 refills | Status: DC

## 2018-11-19 MED ORDER — FUROSEMIDE 10 MG/ML IJ SOLN: 40.0000 mg | Freq: Every day | INTRAMUSCULAR | 0 refills | Status: DC

## 2018-11-19 MED ORDER — SPIRONOLACTONE 25 MG PO TABS: 25.0000 mg | ORAL_TABLET | Freq: Every day | ORAL | Status: DC

## 2018-11-19 MED ORDER — SPIRONOLACTONE 25 MG PO TABS: 25.0000 mg | ORAL_TABLET | Freq: Every day | ORAL | 0 refills | Status: DC

## 2018-11-19 MED ORDER — PREDNISONE 50 MG PO TABS: 50.0000 mg | ORAL_TABLET | Freq: Every day | ORAL | Status: DC

## 2018-11-19 NOTE — Progress Notes (Signed)
[facility] [place] NAME: Jasmine Camacho    MR#:  [id number]  DATE OF BIRTH:  1955/10/21  SUBJECTIVE:  CHIEF COMPLAINT:   Chief Complaint  Patient presents with  . Shortness of Breath   Came with worsening shortness of breath and dyspnea on exertion.  Noted to have diffuse groundglass opacities on both lungs with pulmonary edema. Feels slightly better.  BNP is elevated.  Patient appears anxious/hyperactive, as per her and husband she is always like that. REVIEW OF SYSTEMS:  CONSTITUTIONAL: No fever, fatigue or weakness.  EYES: No blurred or double vision.  EARS, NOSE, AND THROAT: No tinnitus or ear pain.  RESPIRATORY: No cough, have shortness of breath, wheezing, no hemoptysis.  CARDIOVASCULAR: No chest pain, orthopnea, edema.  GASTROINTESTINAL: No nausea, vomiting, diarrhea or abdominal pain.  GENITOURINARY: No dysuria, hematuria.  ENDOCRINE: No polyuria, nocturia,  HEMATOLOGY: No anemia, easy bruising or bleeding SKIN: No rash or lesion. MUSCULOSKELETAL: No joint pain or arthritis.   NEUROLOGIC: No tingling, numbness, weakness.  PSYCHIATRY: No anxiety or depression.   ROS  DRUG ALLERGIES:   Allergies  Allergen Reactions  . Ambien [Zolpidem]   . Asa [Aspirin]   . Benazepril Hcl   . Codeine Sulfate Nausea Only  . Metoprolol Tartrate   . Other Other (See Comments)    [place]  . Hctz [Hydrochlorothiazide] Rash    VITALS:  Blood pressure (!) 179/103, pulse (!) 107, temperature 97.8 F (36.6 C), temperature source Oral, resp. rate 18, height 5\' 4"  (1.626 m), weight 71.5 kg, SpO2 98 %.  PHYSICAL EXAMINATION:  GENERAL:  63 y.o.-year-old patient lying in the bed with no acute distress.  EYES: Pupils equal, round, reactive to light and accommodation. No scleral icterus. Extraocular muscles intact.  HEENT: Head atraumatic, normocephalic. Oropharynx and nasopharynx clear.  NECK:  Supple, no jugular venous distention. No thyroid  enlargement, no tenderness.  LUNGS: Normal breath sounds bilaterally, some wheezing, bilateral crepitation. No use of accessory muscles of respiration.  On supplemental oxygen. CARDIOVASCULAR: S1, S2 normal. No murmurs, rubs, or gallops.  ABDOMEN: Soft, nontender, nondistended. Bowel sounds present. No organomegaly or mass.  EXTREMITIES: No pedal edema, cyanosis, or clubbing.  NEUROLOGIC: Cranial nerves II through XII are intact. Muscle strength 5/5 in all extremities. Sensation intact. Gait not checked.  PSYCHIATRIC: The patient is alert and oriented x 3.  Patient appears to be hyperactive she does not keep constant eye contact and keep constantly moving her head while talking to me. SKIN: No obvious rash, lesion, or ulcer.   Physical Exam LABORATORY PANEL:   CBC Recent Labs  Lab 11/17/18 1311  WBC 7.2  HGB 10.2*  HCT 31.1*  PLT 349   ------------------------------------------------------------------------------------------------------------------  Chemistries  Recent Labs  Lab 11/17/18 1311  NA 135  K 4.3  CL 102  CO2 23  GLUCOSE 161*  BUN 28*  CREATININE 0.82  CALCIUM 9.2  AST 32  ALT 49*  ALKPHOS 123  BILITOT 0.8   ------------------------------------------------------------------------------------------------------------------  Cardiac Enzymes No results for input(s): TROPONINI in the last 168 hours. ------------------------------------------------------------------------------------------------------------------  RADIOLOGY:  Ct Angio Head W Or Wo Contrast  Result Date: 11/17/2018 CLINICAL DATA:  Intracranial hemorrhage, known, follow-up. Additional history provided: Brain aneurysm with hemorrhage 15 years ago. Additional history obtained from ER provider as shortness of breath, COVID EXAM: CT ANGIOGRAPHY HEAD TECHNIQUE: Multidetector CT imaging of the head was performed using the standard protocol during bolus administration of intravenous contrast. Multiplanar  CT image  reconstructions and MIPs were obtained to evaluate the vascular anatomy. CONTRAST:  53mL OMNIPAQUE IOHEXOL 350 MG/ML SOLN COMPARISON:  Report from noncontrast head CT 09/21/2002 (images unavailable). FINDINGS: CT HEAD Brain: Embolization coil masses in the region of the distal left ICA/M1 left MCA. Streak artifact limits evaluation of surrounding structures. There are chronic regions of encephalomalacia/gliosis within the left frontal lobe, parietal lobe and posterosuperior left temporal lobe in the left MCA vascular territory, likely reflecting chronic infarcts. Associated ex vacuo dilatation of the left lateral ventricle. There is also abnormal hypodensity within the anterior right frontal lobe periventricular white matter with associated volume loss, consistent with chronic white matter infarct. Associated ex vacuo dilatation of the right lateral ventricle frontal horn. Additional chronic appearing infarct within the left cerebellum. No evidence of acute intracranial hemorrhage. No midline shift or extra-axial fluid collection. Cerebral volume is normal for age Vascular: Reported separately Skull: No calvarial fracture or suspicious calvarial lesion Sinuses: No significant paranasal sinus disease or mastoid effusion at the imaged levels. Orbits: Visualized orbits demonstrate no acute abnormality. CTA HEAD Anterior circulation: Calcified and noncalcified atherosclerotic plaque within the intracranial right internal carotid artery. Resultant moderate/advanced stenosis within the distal cavernous/paraclinoid right ICA (series 11, image 71). Additional sites of mild stenosis more proximally within the intracranial right ICA. Calcified and noncalcified plaque within the intracranial left internal carotid artery. Apparent moderate/advanced stenosis within the distal cavernous/paraclinoid left internal carotid artery. Additional sites of mild stenosis more proximally within the intracranial left ICA. The right  middle and anterior cerebral arteries are patent without significant proximal stenosis. Streak artifact from embolization coil masses obscures the left carotid artery terminus and the proximal M1 left middle cerebral artery. The M1 left middle cerebral artery appears patent. The left anterior cerebral artery is patent without significant proximal stenosis. 2 mm aneurysm versus infundibulum projecting inferiorly from the supraclinoid right ICA (series 7, image 53). Streak artifact limits evaluation for residual/recurrent aneurysm at the site of the embolization coil masses. Posterior circulation: The intracranial vertebral arteries are patent without significant stenosis. Proximal flow is seen within the PICA vessels bilaterally. Flow is seen within the right AICA proximally. The left AICA is poorly delineated. The basilar artery is patent without significant stenosis. Flow is seen within the proximal superior cerebellar arteries bilaterally. The bilateral posterior cerebral arteries are patent without significant proximal stenosis Venous sinuses: Within limitations of contrast timing, no convincing thrombus. Anatomic variants: None significant IMPRESSION: CT head: 1. No CT evidence of acute intracranial abnormality. 2. Chronic infarcts within the left MCA vascular territory, within the right frontal lobe periventricular white matter and left cerebellum. CTA head: 1. Atherosclerotic disease within the intracranial internal carotid arteries with multifocal stenoses. Most notably, there are moderate/advanced stenoses within the distal cavernous/paraclinoid internal carotid arteries bilaterally. 2. The left AICA is poorly delineated. This may be due to small vessel size and/or stenosis. 3. Embolization coil masses in the region of the left ICA terminus and proximal M1 left MCA. Correlate with procedural history. Associated streak artifact limits evaluation for residual/recurrent aneurysm at these sites. 4. 2 mm aneurysm  versus infundibulum of the supraclinoid right ICA. Electronically Signed   By: Jackey Loge   On: 11/17/2018 20:27    ASSESSMENT AND PLAN:   Active Problems:   Acute respiratory failure [facility])  Patient is a 63 year old white female with history of hypertension, hyperthyroidism who is presenting to the hospital with complaints of shortness of breath  1.  Acute respiratory failure due to atypical  pneumonia  IV ceftriaxone azithromycin Appreciate pulmonary consult.   Nebs steroids  2.  Hyperthyrodism -continue Tapazole  3.  Hypertension continue verapamil.  4.  Nicotine abuse smoking cessation provided 4 minutes spent strongly recommend she stop smoking nicotine patch will be started  5.  Elevated BNP Patient does not have any cardiac history.  Get echocardiogram done. IV Lasix given.  All the records are reviewed and case discussed with Care Management/Social Workerr. Management plans discussed with the patient, family and they are in agreement.  CODE STATUS: Full code.  TOTAL TIME TAKING CARE OF THIS PATIENT: 35 minutes.   Patient's husband was present in the room during the visit.  POSSIBLE D/C IN 1-2 DAYS, DEPENDING ON CLINICAL CONDITION.   Altamese Dilling M.D on 11/19/2018   Between 7am to 6pm - Pager - [phone]  After 6pm go to [email or web address] - password [email or web address]  [facility]  Office  [phone]  CC: Primary care physician; Dorcas Carrow, DO  Note: This dictation was prepared with Dragon dictation along with smaller phrase technology. Any transcriptional errors that result from this process are unintentional.

## 2018-11-19 NOTE — Consult Note (Addendum)
Cardiology Consultation:   Patient ID: Jasmine Camacho MRN: [id number]; DOB: [birth year]  Admit date: 11/17/2018 Date of Consult: 11/19/2018  Primary Care Provider: Dorcas CarrowJohnson, Megan P, DO Primary Cardiologist: No primary care provider on file.   Primary Electrophysiologist:  None **    Patient Profile:   Jasmine Camacho is a 63 y.o. female with a hx of hyperthyroidism who is being seen today for the evaluation of shortness of breath with left ventricular dysfunction at the request of Dr V History of prior stroke with aphasia, frequent PVCs, and found on CT to have upper lobe opacity consistent with atypical pneumonia; she was afebrile and hypertensive, without leukocytosis.  BNP was 4000 COVID negative. 11/18/2018 echocardiogram EF 20-25%; EF 2013 normal with moderate LVH  Past history notable for brain aneurysm with coiling at [facility].  Graves' disease hypertension  History of Present Illness:   Ms. Jasmine Camacho admitted 10/8 with increasing shortness of breath.  She had been seen as an outpatient treated with antibiotics and prednisone.  She underwent CT scanning which was negative for pulmonary embolism but did show left upper lobe hazy opacities consistent with atypical pneumonia.  BNP was elevated.  4000. Echocardiogram demonstrated ejection fraction of 25%.  She feels fine.  She denies shortness of breath.  She is insistent on going home.  She attributes all of her issues to exposure to [place].  Heart Pathway Score:     Past Medical History:  Diagnosis Date   Aphasia    Hypertension    Insomnia    Menopausal symptom    Menorrhagia    Pneumonia    Seasonal allergies    Tachycardia    Thyroid disease    Ventricular premature contractions     Past Surgical History:  Procedure Laterality Date   aneurysm surgery  09/2003   DILATION AND CURETTAGE OF UTERUS  2007         Inpatient Medications: Scheduled Meds:  azithromycin  500 mg Oral Daily   budesonide (PULMICORT)  nebulizer solution  0.25 mg Nebulization BID   enoxaparin (LOVENOX) injection  40 mg Subcutaneous Q24H   furosemide  40 mg Intravenous Daily   methimazole  5 mg Oral Daily   nicotine  21 mg Transdermal Daily   [START ON 11/20/2018] predniSONE  50 mg Oral Q breakfast   sodium chloride flush  3 mL Intravenous Q12H   verapamil  240 mg Oral QHS   Continuous Infusions:  sodium chloride     sodium chloride 250 mL (11/19/18 1246)   cefTRIAXone (ROCEPHIN)  IV 2 g (11/19/18 1247)   PRN Meds: sodium chloride, sodium chloride, acetaminophen, hydrALAZINE, ipratropium-albuterol, labetalol, sodium chloride flush, zolpidem  Allergies:    Allergies  Allergen Reactions   Ambien [Zolpidem]    Asa [Aspirin]    Benazepril Hcl    Codeine Sulfate Nausea Only   Metoprolol Tartrate    Other Other (See Comments)    Valtura   Hctz [Hydrochlorothiazide] Rash    Social History:   Social History   Socioeconomic History   Marital status: Married    Spouse name: Not on file   Number of children: Not on file   Years of education: Not on file   Highest education level: Not on file  Occupational History   Not on file  Social Needs   Financial resource strain: Not on file   Food insecurity    Worry: Not on file    Inability: Not on file   Transportation needs  Medical: Not on file    Non-medical: Not on file  Tobacco Use   Smoking status: Current Every Day Smoker    Packs/day: 0.50    Types: Cigarettes   Smokeless tobacco: Never Used  Substance and Sexual Activity   Alcohol use: No    Alcohol/week: 0.0 standard drinks   Drug use: No   Sexual activity: Never  Lifestyle   Physical activity    Days per week: Not on file    Minutes per session: Not on file   Stress: Not on file  Relationships   Social connections    Talks on phone: Not on file    Gets together: Not on file    Attends religious service: Not on file    Active member of club or  organization: Not on file    Attends meetings of clubs or organizations: Not on file    Relationship status: Not on file   Intimate partner violence    Fear of current or ex partner: Not on file    Emotionally abused: Not on file    Physically abused: Not on file    Forced sexual activity: Not on file  Other Topics Concern   Not on file  Social History Narrative   Not on file    Family History:     Family History  Problem Relation Age of Onset   Stroke Mother    Diabetes Mother    Heart disease Mother    Hypertension Mother    Alcohol abuse Father    Cancer Father        liver   Diabetes Brother    Hypertension Brother    Heart disease Brother    Cancer Brother        colon     ROS:  Please see the history of present illness.    All other ROS reviewed and negative.     Physical Exam/Data:   Vitals:   11/18/18 2216 11/19/18 0500 11/19/18 0615 11/19/18 0907  BP: (!) 155/99 (!) 145/107 (!) 147/101 114/74  Pulse: 92 (!) 107 (!) 109 (!) 104  Resp:  20  16  Temp:  98.2 F (36.8 C)  98.6 F (37 C)  TempSrc:  Oral  Oral  SpO2:  97%  97%  Weight:  71.5 kg    Height:        Intake/Output Summary (Last 24 hours) at 11/19/2018 1317 Last data filed at 11/19/2018 1227 Gross per 24 hour  Intake 340.13 ml  Output 2900 ml  Net -2559.87 ml   Last 3 Weights 11/19/2018 11/17/2018 11/17/2018  Weight (lbs) 157 lb 9.6 oz 158 lb 6.4 oz 163 lb  Weight (kg) 71.487 kg 71.85 kg 73.936 kg     Body mass index is 27.05 kg/m.  General:  Well nourished, well developed, in no acute distress HEENT: normal Lymph: no adenopathy Neck: no JVD Endocrine:  No thryomegaly Vascular: No carotid bruits; FA pulses 2+ bilaterally without bruits  Cardiac:  normal S1, S2; RRR; no murmur  Lungs:  clear to auscultation bilaterally, no wheezing, rhonchi or rales  Abd: soft, nontender, no hepatomegaly  Ext: no edema Musculoskeletal:  No deformities, BUE and BLE strength normal and  equal Skin: warm and dry  Neuro:  CNs 2-12 intact, no focal abnormalities noted Psych:  Normal affect   EKG:  The EKG was personally reviewed and demonstrates:  Sinus tach  ECG 9/18 demonstrated sinus rhythm at about 90 with ventricular bigeminy and  RVOT PVCs. Telemetry:  Telemetry was personally reviewed and demonstrates: Sinus tachycardia with a PVC burden of about 10-15/min i.e. 10-20%  Relevant CV Studies: As above   Laboratory Data:  High Sensitivity Troponin:   Recent Labs  Lab 11/17/18 1311 11/17/18 1511  TROPONINIHS 46* 49*     Chemistry Recent Labs  Lab 11/17/18 1311  NA 135  K 4.3  CL 102  CO2 23  GLUCOSE 161*  BUN 28*  CREATININE 0.82  CALCIUM 9.2  GFRNONAA >60  GFRAA >60  ANIONGAP 10    Recent Labs  Lab 11/17/18 1311  PROT 7.7  ALBUMIN 3.9  AST 32  ALT 49*  ALKPHOS 123  BILITOT 0.8   Hematology Recent Labs  Lab 11/17/18 1311  WBC 7.2  RBC 3.43*  HGB 10.2*  HCT 31.1*  MCV 90.7  MCH 29.7  MCHC 32.8  RDW 16.5*  PLT 349   BNP Recent Labs  Lab 11/17/18 1311  BNP 4,252.0*    DDimer No results for input(s): DDIMER in the last 168 hours.   Radiology/Studies:  Ct Angio Head W Or Wo Contrast  Result Date: 11/17/2018 CLINICAL DATA:  Intracranial hemorrhage, known, follow-up. Additional history provided: Brain aneurysm with hemorrhage 15 years ago. Additional history obtained from ER provider as shortness of breath, COVID EXAM: CT ANGIOGRAPHY HEAD TECHNIQUE: Multidetector CT imaging of the head was performed using the standard protocol during bolus administration of intravenous contrast. Multiplanar CT image reconstructions and MIPs were obtained to evaluate the vascular anatomy. CONTRAST:  39mL OMNIPAQUE IOHEXOL 350 MG/ML SOLN COMPARISON:  Report from noncontrast head CT 09/21/2002 (images unavailable). FINDINGS: CT HEAD Brain: Embolization coil masses in the region of the distal left ICA/M1 left MCA. Streak artifact limits evaluation of  surrounding structures. There are chronic regions of encephalomalacia/gliosis within the left frontal lobe, parietal lobe and posterosuperior left temporal lobe in the left MCA vascular territory, likely reflecting chronic infarcts. Associated ex vacuo dilatation of the left lateral ventricle. There is also abnormal hypodensity within the anterior right frontal lobe periventricular white matter with associated volume loss, consistent with chronic white matter infarct. Associated ex vacuo dilatation of the right lateral ventricle frontal horn. Additional chronic appearing infarct within the left cerebellum. No evidence of acute intracranial hemorrhage. No midline shift or extra-axial fluid collection. Cerebral volume is normal for age Vascular: Reported separately Skull: No calvarial fracture or suspicious calvarial lesion Sinuses: No significant paranasal sinus disease or mastoid effusion at the imaged levels. Orbits: Visualized orbits demonstrate no acute abnormality. CTA HEAD Anterior circulation: Calcified and noncalcified atherosclerotic plaque within the intracranial right internal carotid artery. Resultant moderate/advanced stenosis within the distal cavernous/paraclinoid right ICA (series 11, image 71). Additional sites of mild stenosis more proximally within the intracranial right ICA. Calcified and noncalcified plaque within the intracranial left internal carotid artery. Apparent moderate/advanced stenosis within the distal cavernous/paraclinoid left internal carotid artery. Additional sites of mild stenosis more proximally within the intracranial left ICA. The right middle and anterior cerebral arteries are patent without significant proximal stenosis. Streak artifact from embolization coil masses obscures the left carotid artery terminus and the proximal M1 left middle cerebral artery. The M1 left middle cerebral artery appears patent. The left anterior cerebral artery is patent without significant  proximal stenosis. 2 mm aneurysm versus infundibulum projecting inferiorly from the supraclinoid right ICA (series 7, image 53). Streak artifact limits evaluation for residual/recurrent aneurysm at the site of the embolization coil masses. Posterior circulation: The intracranial vertebral arteries are  patent without significant stenosis. Proximal flow is seen within the PICA vessels bilaterally. Flow is seen within the right AICA proximally. The left AICA is poorly delineated. The basilar artery is patent without significant stenosis. Flow is seen within the proximal superior cerebellar arteries bilaterally. The bilateral posterior cerebral arteries are patent without significant proximal stenosis Venous sinuses: Within limitations of contrast timing, no convincing thrombus. Anatomic variants: None significant IMPRESSION: CT head: 1. No CT evidence of acute intracranial abnormality. 2. Chronic infarcts within the left MCA vascular territory, within the right frontal lobe periventricular white matter and left cerebellum. CTA head: 1. Atherosclerotic disease within the intracranial internal carotid arteries with multifocal stenoses. Most notably, there are moderate/advanced stenoses within the distal cavernous/paraclinoid internal carotid arteries bilaterally. 2. The left AICA is poorly delineated. This may be due to small vessel size and/or stenosis. 3. Embolization coil masses in the region of the left ICA terminus and proximal M1 left MCA. Correlate with procedural history. Associated streak artifact limits evaluation for residual/recurrent aneurysm at these sites. 4. 2 mm aneurysm versus infundibulum of the supraclinoid right ICA. Electronically Signed   By: Jackey Loge   On: 11/17/2018 20:27   Ct Angio Chest Pe W And/or Wo Contrast  Result Date: 11/17/2018 CLINICAL DATA:  Intermediate probability for pulmonary embolism. Elevated D-dimer with shortness of breath 1 week. Negative COVID-19 test 1 week ago.  EXAM: CT ANGIOGRAPHY CHEST WITH CONTRAST TECHNIQUE: Multidetector CT imaging of the chest was performed using the standard protocol during bolus administration of intravenous contrast. Multiplanar CT image reconstructions and MIPs were obtained to evaluate the vascular anatomy. CONTRAST:  78mL OMNIPAQUE IOHEXOL 350 MG/ML SOLN COMPARISON:  None. FINDINGS: Cardiovascular: Mild cardiomegaly. Ascending thoracic aorta is normal in caliber as is mild ectasia of the descending thoracic aorta measuring 3.7 cm in AP diameter. Calcified atherosclerotic plaque is present over the thoracic aorta. Pulmonary arterial system is well opacified without evidence of emboli. There is calcified plaque over the left main and 3 vessel coronary arteries. Remaining vascular structures are unremarkable. Mediastinum/Nodes: Possible low-density 1.8 cm lymph node over the precarinal region. Mild low-density lymphoid tissue over the hilar regions. Low density material over the subcarinal region likely lymphoid tissue without well-defined lymph node. Mild amount of fluid within the distal esophagus which may be due to reflux or dysmotility. Lungs/Pleura: Lungs are adequately inflated and demonstrate small bilateral pleural effusions with minimal atelectasis over the mid to lower lungs. There is subtle patchy peripheral hazy opacification bilaterally which may be due to an infectious, atypical infectious or inflammatory process. Airways are normal. Upper Abdomen: No acute findings. Calcified plaque over the abdominal aorta. Musculoskeletal: Degenerative changes of the spine. Review of the MIP images confirms the above findings. IMPRESSION: 1.  No evidence of pulmonary embolism. 2. Subtle hazy patchy bilateral peripheral airspace opacification which may be due to infectious, atypical infectious or inflammatory process. Small bilateral pleural effusions with associated bibasilar atelectasis. Mild mediastinal adenopathy likely reactive. 3. Aortic  Atherosclerosis (ICD10-I70.0). Atherosclerotic coronary artery disease. Mild ectasia of the descending thoracic aorta measuring 3.7 cm. 4. Electronically Signed   By: Elberta Fortis M.D.   On: 11/17/2018 16:49   Dg Chest Portable 1 View  Result Date: 11/17/2018 CLINICAL DATA:  Shortness of breath EXAM: PORTABLE CHEST 1 VIEW COMPARISON:  07/12/2018 FINDINGS: Cardiomegaly. There is subtle bilateral interstitial and heterogeneous airspace opacity, most conspicuous in the left midlung. There are probable small bilateral pleural effusions. The visualized skeletal structures are unremarkable. IMPRESSION: Cardiomegaly.  There is subtle bilateral interstitial and heterogeneous airspace opacity, most conspicuous in the left midlung. There are probable small bilateral pleural effusions. Findings are most consistent with edema although infection is a differential consideration. Electronically Signed   By: Lauralyn Primes M.D.   On: 11/17/2018 13:56    Assessment and Plan:   1. Cardiomyopathy question mechanism   2. PVCs RVOT frequent 3. Hypertension 4. CVA   5. Intracerebral coiling 2004 6. Graves Disease  euthryoid 7. Allergies listed to metoprolol/benazepril   The patient has a cardiomyopathy.  Mechanism is not clear.  Most likely culprit is longstanding ventricular ectopy.  She is on verapamil but it has been helpful.  At this juncture would be inclined to treat her cardiomyopathy as well as to use beta-blockers.  Per chart has a listed beta-blocker allergy.  I have asked her husband to reach out to their longstanding drugstore [facility] and see if we can find out when and if the Toprol was indeed added and if so what the issue was.  For now with her cardiomyopathy, and the allergies listed above, we will begin her on Aldactone.  I will discontinue her verapamil.  We have reviewed the issues of hyperkalemia.  She will need follow-up within the next 1-2 weeks.  There is 1 recorded value of potassium at  5.0 in 2016 all others are in the more normal range  We did not have a conversation regarding the PVC contribution.  She was adamant about leaving.  Would consider the addition of dronaderone in the office and referral for catheter ablation    For questions or updates, please contact [facility] Please consult [email or web address] for contact info under     Signed, Sherryl Manges, MD  11/19/2018 1:17 PM

## 2018-11-19 NOTE — Progress Notes (Signed)
Pulmonary Medicine          Date: 11/19/2018,   MRN# [id number] Jasmine Camacho 1955-11-12     AdmissionWeight: 73.9 kg                 CurrentWeight: 71.5 kg      CHIEF COMPLAINT:   Community-acquired pneumonia   SUBJECTIVE    Patient is clinically improved, she is off oxygen on room air at rest.    PAST MEDICAL HISTORY   Past Medical History:  Diagnosis Date   Aphasia    Hypertension    Insomnia    Menopausal symptom    Menorrhagia    Pneumonia    Seasonal allergies    Tachycardia    Thyroid disease    Ventricular premature contractions      SURGICAL HISTORY   Past Surgical History:  Procedure Laterality Date   aneurysm surgery  09/2003   DILATION AND CURETTAGE OF UTERUS  2007     FAMILY HISTORY   Family History  Problem Relation Age of Onset   Stroke Mother    Diabetes Mother    Heart disease Mother    Hypertension Mother    Alcohol abuse Father    Cancer Father        liver   Diabetes Brother    Hypertension Brother    Heart disease Brother    Cancer Brother        colon     SOCIAL HISTORY   Social History   Tobacco Use   Smoking status: Current Every Day Smoker    Packs/day: 0.50    Types: Cigarettes   Smokeless tobacco: Never Used  Substance Use Topics   Alcohol use: No    Alcohol/week: 0.0 standard drinks   Drug use: No     MEDICATIONS    Home Medication:    Current Medication:  Current Facility-Administered Medications:    0.9 %  sodium chloride infusion, 250 mL, Intravenous, PRN, Auburn BilberryPatel, Shreyang, MD   0.9 %  sodium chloride infusion, , Intravenous, PRN, Auburn BilberryPatel, Shreyang, MD, Stopped at 11/18/18 1206   acetaminophen (TYLENOL) tablet 650 mg, 650 mg, Oral, Q8H PRN, Auburn BilberryPatel, Shreyang, MD   azithromycin (ZITHROMAX) tablet 500 mg, 500 mg, Oral, Daily, Altamese DillingVachhani, Vaibhavkumar, MD, 500 mg at 11/18/18 2039   budesonide (PULMICORT) nebulizer solution 0.25 mg, 0.25 mg, Nebulization,  BID, Auburn BilberryPatel, Shreyang, MD, 0.25 mg at 11/19/18 0756   cefTRIAXone (ROCEPHIN) 2 g in sodium chloride 0.9 % 100 mL IVPB, 2 g, Intravenous, Q24H, Auburn BilberryPatel, Shreyang, MD, Stopped at 11/18/18 1237   enoxaparin (LOVENOX) injection 40 mg, 40 mg, Subcutaneous, Q24H, Auburn BilberryPatel, Shreyang, MD, 40 mg at 11/18/18 2038   furosemide (LASIX) injection 40 mg, 40 mg, Intravenous, Daily, Vida RiggerAleskerov, Genee Rann, MD, 40 mg at 11/19/18 0951   hydrALAZINE (APRESOLINE) injection 10 mg, 10 mg, Intravenous, Q6H PRN, Auburn BilberryPatel, Shreyang, MD, 10 mg at 11/19/18 0520   ipratropium-albuterol (DUONEB) 0.5-2.5 (3) MG/3ML nebulizer solution 3 mL, 3 mL, Nebulization, Q4H PRN, Karna ChristmasAleskerov, Bodhi Moradi, MD   labetalol (NORMODYNE) injection 10 mg, 10 mg, Intravenous, Q2H PRN, Auburn BilberryPatel, Shreyang, MD, 10 mg at 11/17/18 2115   methimazole (TAPAZOLE) tablet 5 mg, 5 mg, Oral, Daily, Auburn BilberryPatel, Shreyang, MD, 5 mg at 11/19/18 0950   methylPREDNISolone sodium succinate (SOLU-MEDROL) 125 mg/2 mL injection 60 mg, 60 mg, Intravenous, Q12H, Altamese DillingVachhani, Vaibhavkumar, MD, 60 mg at 11/19/18 0951   nicotine (NICODERM CQ - dosed in mg/24 hours) patch 21 mg, 21 mg, Transdermal,  Daily, Dustin Flock, MD, 21 mg at 11/19/18 9371   sodium chloride flush (NS) 0.9 % injection 3 mL, 3 mL, Intravenous, Q12H, Dustin Flock, MD, 3 mL at 11/18/18 2040   sodium chloride flush (NS) 0.9 % injection 3 mL, 3 mL, Intravenous, PRN, Dustin Flock, MD   verapamil (CALAN-SR) CR tablet 240 mg, 240 mg, Oral, QHS, Dustin Flock, MD, 240 mg at 11/18/18 2039   zolpidem (AMBIEN) tablet 5 mg, 5 mg, Oral, QHS PRN, Dustin Flock, MD, 5 mg at 11/18/18 1941    ALLERGIES   Ambien [zolpidem], Asa [aspirin], Benazepril hcl, Codeine sulfate, Metoprolol tartrate, Other, and Hctz [hydrochlorothiazide]     REVIEW OF SYSTEMS    Review of Systems:  Gen:  Denies  fever, sweats, chills weigh loss  HEENT: Denies blurred vision, double vision, ear pain, eye pain, hearing loss, nose bleeds, sore  throat Cardiac:  No dizziness, chest pain or heaviness, chest tightness,edema Resp:  Reports shortness of breath,wheezing, hemoptysis,  Gi: Denies swallowing difficulty, stomach pain, nausea or vomiting, diarrhea, constipation, bowel incontinence Gu:  Denies bladder incontinence, burning urine Ext:   Denies Joint pain, stiffness or swelling Skin: Denies  skin rash, easy bruising or bleeding or hives Endoc:  Denies polyuria, polydipsia , polyphagia or weight change Psych:   Denies depression, insomnia or hallucinations   Other:  All other systems negative   VS: BP 114/74 (BP Location: Left Arm)    Pulse (!) 104    Temp 98.6 F (37 C) (Oral)    Resp 16    Ht 5\' 4"  (1.626 m)    Wt 71.5 kg    LMP  (LMP Unknown)    SpO2 97%    BMI 27.05 kg/m      PHYSICAL EXAM    GENERAL:NAD, no fevers, chills, no weakness no fatigue HEAD: Normocephalic, atraumatic.  EYES: Pupils equal, round, reactive to light. Extraocular muscles intact. No scleral icterus.  MOUTH: Moist mucosal membrane. Dentition intact. No abscess noted.  EAR, NOSE, THROAT: Clear without exudates. No external lesions.  NECK: Supple. No thyromegaly. No nodules. No JVD.  PULMONARY:bilateral crackles improved from yesterday CARDIOVASCULAR: S1 and S2. Regular rate and rhythm. No murmurs, rubs, or gallops. No edema. Pedal pulses 2+ bilaterally.  GASTROINTESTINAL: Soft, nontender, nondistended. No masses. Positive bowel sounds. No hepatosplenomegaly.  MUSCULOSKELETAL: No swelling, clubbing, or edema. Range of motion full in all extremities.  NEUROLOGIC: Cranial nerves II through XII are intact. No gross focal neurological deficits. Sensation intact. Reflexes intact.  SKIN: No ulceration, lesions, rashes, or cyanosis. Skin warm and dry. Turgor intact.  PSYCHIATRIC: Mood, affect within normal limits. The patient is awake, alert and oriented x 3. Insight, judgment intact.       IMAGING    Ct Angio Head W Or Wo Contrast  Result  Date: 11/17/2018 CLINICAL DATA:  Intracranial hemorrhage, known, follow-up. Additional history provided: Brain aneurysm with hemorrhage 15 years ago. Additional history obtained from ER provider as shortness of breath, COVID EXAM: CT ANGIOGRAPHY HEAD TECHNIQUE: Multidetector CT imaging of the head was performed using the standard protocol during bolus administration of intravenous contrast. Multiplanar CT image reconstructions and MIPs were obtained to evaluate the vascular anatomy. CONTRAST:  51mL OMNIPAQUE IOHEXOL 350 MG/ML SOLN COMPARISON:  Report from noncontrast head CT 09/21/2002 (images unavailable). FINDINGS: CT HEAD Brain: Embolization coil masses in the region of the distal left ICA/M1 left MCA. Streak artifact limits evaluation of surrounding structures. There are chronic regions of encephalomalacia/gliosis within the left frontal  lobe, parietal lobe and posterosuperior left temporal lobe in the left MCA vascular territory, likely reflecting chronic infarcts. Associated ex vacuo dilatation of the left lateral ventricle. There is also abnormal hypodensity within the anterior right frontal lobe periventricular white matter with associated volume loss, consistent with chronic white matter infarct. Associated ex vacuo dilatation of the right lateral ventricle frontal horn. Additional chronic appearing infarct within the left cerebellum. No evidence of acute intracranial hemorrhage. No midline shift or extra-axial fluid collection. Cerebral volume is normal for age Vascular: Reported separately Skull: No calvarial fracture or suspicious calvarial lesion Sinuses: No significant paranasal sinus disease or mastoid effusion at the imaged levels. Orbits: Visualized orbits demonstrate no acute abnormality. CTA HEAD Anterior circulation: Calcified and noncalcified atherosclerotic plaque within the intracranial right internal carotid artery. Resultant moderate/advanced stenosis within the distal cavernous/paraclinoid  right ICA (series 11, image 71). Additional sites of mild stenosis more proximally within the intracranial right ICA. Calcified and noncalcified plaque within the intracranial left internal carotid artery. Apparent moderate/advanced stenosis within the distal cavernous/paraclinoid left internal carotid artery. Additional sites of mild stenosis more proximally within the intracranial left ICA. The right middle and anterior cerebral arteries are patent without significant proximal stenosis. Streak artifact from embolization coil masses obscures the left carotid artery terminus and the proximal M1 left middle cerebral artery. The M1 left middle cerebral artery appears patent. The left anterior cerebral artery is patent without significant proximal stenosis. 2 mm aneurysm versus infundibulum projecting inferiorly from the supraclinoid right ICA (series 7, image 53). Streak artifact limits evaluation for residual/recurrent aneurysm at the site of the embolization coil masses. Posterior circulation: The intracranial vertebral arteries are patent without significant stenosis. Proximal flow is seen within the PICA vessels bilaterally. Flow is seen within the right AICA proximally. The left AICA is poorly delineated. The basilar artery is patent without significant stenosis. Flow is seen within the proximal superior cerebellar arteries bilaterally. The bilateral posterior cerebral arteries are patent without significant proximal stenosis Venous sinuses: Within limitations of contrast timing, no convincing thrombus. Anatomic variants: None significant IMPRESSION: CT head: 1. No CT evidence of acute intracranial abnormality. 2. Chronic infarcts within the left MCA vascular territory, within the right frontal lobe periventricular white matter and left cerebellum. CTA head: 1. Atherosclerotic disease within the intracranial internal carotid arteries with multifocal stenoses. Most notably, there are moderate/advanced stenoses  within the distal cavernous/paraclinoid internal carotid arteries bilaterally. 2. The left AICA is poorly delineated. This may be due to small vessel size and/or stenosis. 3. Embolization coil masses in the region of the left ICA terminus and proximal M1 left MCA. Correlate with procedural history. Associated streak artifact limits evaluation for residual/recurrent aneurysm at these sites. 4. 2 mm aneurysm versus infundibulum of the supraclinoid right ICA. Electronically Signed   By: Jackey LogeKyle  Golden   On: 11/17/2018 20:27   Ct Angio Chest Pe W And/or Wo Contrast  Result Date: 11/17/2018 CLINICAL DATA:  Intermediate probability for pulmonary embolism. Elevated D-dimer with shortness of breath 1 week. Negative COVID-19 test 1 week ago. EXAM: CT ANGIOGRAPHY CHEST WITH CONTRAST TECHNIQUE: Multidetector CT imaging of the chest was performed using the standard protocol during bolus administration of intravenous contrast. Multiplanar CT image reconstructions and MIPs were obtained to evaluate the vascular anatomy. CONTRAST:  75mL OMNIPAQUE IOHEXOL 350 MG/ML SOLN COMPARISON:  None. FINDINGS: Cardiovascular: Mild cardiomegaly. Ascending thoracic aorta is normal in caliber as is mild ectasia of the descending thoracic aorta measuring 3.7 cm in AP diameter.  Calcified atherosclerotic plaque is present over the thoracic aorta. Pulmonary arterial system is well opacified without evidence of emboli. There is calcified plaque over the left main and 3 vessel coronary arteries. Remaining vascular structures are unremarkable. Mediastinum/Nodes: Possible low-density 1.8 cm lymph node over the precarinal region. Mild low-density lymphoid tissue over the hilar regions. Low density material over the subcarinal region likely lymphoid tissue without well-defined lymph node. Mild amount of fluid within the distal esophagus which may be due to reflux or dysmotility. Lungs/Pleura: Lungs are adequately inflated and demonstrate small bilateral  pleural effusions with minimal atelectasis over the mid to lower lungs. There is subtle patchy peripheral hazy opacification bilaterally which may be due to an infectious, atypical infectious or inflammatory process. Airways are normal. Upper Abdomen: No acute findings. Calcified plaque over the abdominal aorta. Musculoskeletal: Degenerative changes of the spine. Review of the MIP images confirms the above findings. IMPRESSION: 1.  No evidence of pulmonary embolism. 2. Subtle hazy patchy bilateral peripheral airspace opacification which may be due to infectious, atypical infectious or inflammatory process. Small bilateral pleural effusions with associated bibasilar atelectasis. Mild mediastinal adenopathy likely reactive. 3. Aortic Atherosclerosis (ICD10-I70.0). Atherosclerotic coronary artery disease. Mild ectasia of the descending thoracic aorta measuring 3.7 cm. 4. Electronically Signed   By: Elberta Fortis M.D.   On: 11/17/2018 16:49   Dg Chest Portable 1 View  Result Date: 11/17/2018 CLINICAL DATA:  Shortness of breath EXAM: PORTABLE CHEST 1 VIEW COMPARISON:  07/12/2018 FINDINGS: Cardiomegaly. There is subtle bilateral interstitial and heterogeneous airspace opacity, most conspicuous in the left midlung. There are probable small bilateral pleural effusions. The visualized skeletal structures are unremarkable. IMPRESSION: Cardiomegaly. There is subtle bilateral interstitial and heterogeneous airspace opacity, most conspicuous in the left midlung. There are probable small bilateral pleural effusions. Findings are most consistent with edema although infection is a differential consideration. Electronically Signed   By: Lauralyn Primes M.D.   On: 11/17/2018 13:56      TRANS THORACIC ECHO 11/2018 IMPRESSIONS    1. Left ventricular ejection fraction, by visual estimation, is 20 to 25%. The left ventricle has normal function. Mildly increased left ventricular size. There is no left ventricular  hypertrophy.Global hypokinesis.  2. Left ventricular diastolic Doppler parameters are consistent with pseudonormalization pattern of LV diastolic filling.  3. Global right ventricle has normal systolic function.The right ventricular size is normal. No increase in right ventricular wall thickness.  4. Left atrial size was mildly dilated.  5. Mildly elevated pulmonary artery systolic pressure.  FINDINGS  Left Ventricle: Left ventricular ejection fraction, by visual estimation, is 20 to 25%. The left ventricle has normal function. There is no left ventricular hypertrophy. Mildly increased left ventricular size. Spectral Doppler shows Left ventricular  diastolic Doppler parameters are consistent with pseudonormalization pattern of LV diastolic filling.   TTE  ASSESSMENT/PLAN   Acute hypoxemic respiratory failure   - due to acute decompensated systolic CHF EF 20-25%  - patient reports fevers, cough now has ground glass opacification  - RVP and covidX2 negative   - sob improved  - BNP is severely elevated , will order TTE, no peripheral edema, she had previous stroke possible BNP elevation from aneurysm will perform stat CTH with contrast ( hx of brain aneurysm)  -agree with empiric abx and solumedrol  -patient requests to go home , shes on room air, cleared from pulmonary for d/c home   COPD with acute exacerbation   - Nebulizer- wiill d/c nebs due to palpitations reported  by patient  -solumedrol   - empiric antibiotics - zithromax and rocephin     Tobacco abuse  - smoking cessation counseling       Thank you for allowing me to participate in the care of this patient.   Patient/Family are satisfied with care plan and all questions have been answered.  This document was prepared using Dragon voice recognition software and may include unintentional dictation errors.     Vida Rigger, M.D.  Division of Pulmonary & Critical Care Medicine  [facility]

## 2018-11-19 NOTE — Discharge Summary (Addendum)
[facility] [place] [facility]   PATIENT NAME: Jasmine Camacho    MR#:  [id number]  DATE OF BIRTH:  02-29-1955  DATE OF ADMISSION:  11/17/2018 ADMITTING PHYSICIAN: Auburn Bilberry, MD  DATE OF DISCHARGE: 11/20/2018   PRIMARY CARE PHYSICIAN: Dorcas Carrow, DO    ADMISSION DIAGNOSIS:  SOB (shortness of breath) [R06.02] Elevated troponin [R77.8] Elevated brain natriuretic peptide (BNP) level [R79.89] Elevated lactic acid level [R79.89] Elevated d-dimer [R79.89]  DISCHARGE DIAGNOSIS:  Active Problems:   Acute respiratory failure (HCC)   SECONDARY DIAGNOSIS:   Past Medical History:  Diagnosis Date  . Aphasia   . Hypertension   . Insomnia   . Menopausal symptom   . Menorrhagia   . Pneumonia   . Seasonal allergies   . Tachycardia   . Thyroid disease   . Ventricular premature contractions     HOSPITAL COURSE:   Patient is a 63 year old white female with history of hypertension, hyperthyroidism who is presenting to the hospital with complaints of shortness of breath  1.Acute respiratory failure due to atypical pneumonia  IV ceftriaxone azithromycin Appreciate pulmonary consult.   Nebs steroids Felt much better.  No need for tapering steroid at home will give antibiotic and inhalers.  2. Hyperthyrodism -continue Tapazole  3.Hypertension continue verapamil.  4.Nicotine abuse smoking cessation provided 4 minutes spent strongly recommend she stop smoking nicotine patch will be started  5.   Severe cardiomyopathy  Elevated BNP Patient does not have any cardiac history.   Call cardiology consult.  Suggested to start on Spironolactone and stop verapamil. Advised to follow in cardiology clinic.  DISCHARGE CONDITIONS:   Stable  CONSULTS OBTAINED:  Treatment Team:  Vida Rigger, MD  DRUG ALLERGIES:   Allergies  Allergen Reactions  . Ambien [Zolpidem]   . Asa [Aspirin]   . Benazepril Hcl   . Codeine Sulfate Nausea  Only  . Metoprolol Tartrate   . Other Other (See Comments)    Valtura  . Hctz [Hydrochlorothiazide] Rash    DISCHARGE MEDICATIONS:   Allergies as of 11/19/2018      Reactions   Ambien [zolpidem]    Asa [aspirin]    Benazepril Hcl    Codeine Sulfate Nausea Only   Metoprolol Tartrate    Other Other (See Comments)   Valtura   Hctz [hydrochlorothiazide] Rash      Medication List    STOP taking these medications   aliskiren 300 MG tablet Commonly known as: TEKTURNA   verapamil 240 MG CR tablet Commonly known as: CALAN-SR     TAKE these medications   albuterol 108 (90 Base) MCG/ACT inhaler Commonly known as: VENTOLIN HFA INL 2 PFS PO Q 4 TO 6 H PRN What changed:   how much to take  how to take this  when to take this  reasons to take this  additional instructions   azithromycin 250 MG tablet Commonly known as: ZITHROMAX Take 1 tablet (250 mg total) by mouth daily for 3 days.   cefUROXime 250 MG tablet Commonly known as: Ceftin Take 1 tablet (250 mg total) by mouth 2 (two) times daily for 3 days.   Combivent Respimat 20-100 MCG/ACT Aers respimat Generic drug: Ipratropium-Albuterol Inhale 1 puff into the lungs every 6 (six) hours.   Eszopiclone 3 MG Tabs Take 1 tablet (3 mg total) by mouth at bedtime. Take immediately before bedtime   furosemide 20 MG tablet Commonly known as: Lasix Take 2 tablets (40 mg total) by mouth  daily.   methimazole 5 MG tablet Commonly known as: TAPAZOLE Take 5 mg by mouth daily.   spironolactone 25 MG tablet Commonly known as: ALDACTONE Take 1 tablet (25 mg total) by mouth daily.   Tylenol 8 Hour Arthritis Pain 650 MG CR tablet Generic drug: acetaminophen Take 650 mg by mouth every 8 (eight) hours as needed (pain or fever).        DISCHARGE INSTRUCTIONS:    Follow with cardiology clinic in 1 week.  If you experience worsening of your admission symptoms, develop shortness of breath, life threatening emergency,  suicidal or homicidal thoughts you must seek medical attention immediately by calling 911 or calling your MD immediately  if symptoms less severe.  You Must read complete instructions/literature along with all the possible adverse reactions/side effects for all the Medicines you take and that have been prescribed to you. Take any new Medicines after you have completely understood and accept all the possible adverse reactions/side effects.   Please note  You were cared for by a hospitalist during your hospital stay. If you have any questions about your discharge medications or the care you received while you were in the hospital after you are discharged, you can call the unit and asked to speak with the hospitalist on call if the hospitalist that took care of you is not available. Once you are discharged, your primary care physician will handle any further medical issues. Please note that NO REFILLS for any discharge medications will be authorized once you are discharged, as it is imperative that you return to your primary care physician (or establish a relationship with a primary care physician if you do not have one) for your aftercare needs so that they can reassess your need for medications and monitor your lab values.    Today   CHIEF COMPLAINT:   Chief Complaint  Patient presents with  . Shortness of Breath    HISTORY OF PRESENT ILLNESS:  Jasmine Camacho  is a 63 y.o. female with a known history of multiple medical problems including hypertension, hypothyroidism and nicotine abuse who is presenting to the hospital with shortness of breath.  Patient states that her symptoms started since last week and has progressively gotten worse.  She complains of dry cough.  Denies any fevers or chills.  Patient had a CT scan of the chest in the emergency room which was negative for pulmonary embolism.  Showed atypical pneumonia.   VITAL SIGNS:  Blood pressure (!) 179/103, pulse (!) 107, temperature 97.8  F (36.6 C), temperature source Oral, resp. rate 18, height 5\' 4"  (1.626 m), weight 71.5 kg, SpO2 98 %.  I/O:    Intake/Output Summary (Last 24 hours) at 11/20/2018 1157 Last data filed at 11/19/2018 1300 Gross per 24 hour  Intake 240 ml  Output 900 ml  Net -660 ml    PHYSICAL EXAMINATION:  GENERAL:  63 y.o.-year-old patient lying in the bed with no acute distress.  EYES: Pupils equal, round, reactive to light and accommodation. No scleral icterus. Extraocular muscles intact.  HEENT: Head atraumatic, normocephalic. Oropharynx and nasopharynx clear.  NECK:  Supple, no jugular venous distention. No thyroid enlargement, no tenderness.  LUNGS: Normal breath sounds bilaterally, no wheezing, rales,rhonchi or crepitation. No use of accessory muscles of respiration.  CARDIOVASCULAR: S1, S2 normal. No murmurs, rubs, or gallops.  ABDOMEN: Soft, non-tender, non-distended. Bowel sounds present. No organomegaly or mass.  EXTREMITIES: No pedal edema, cyanosis, or clubbing.  NEUROLOGIC: Cranial nerves II through XII  are intact. Muscle strength 5/5 in all extremities. Sensation intact. Gait not checked.  PSYCHIATRIC: The patient is alert and oriented x 3.  Appears hyperactive, as per husband this is baseline. SKIN: No obvious rash, lesion, or ulcer.   DATA REVIEW:   CBC Recent Labs  Lab 11/17/18 1311  WBC 7.2  HGB 10.2*  HCT 31.1*  PLT 349    Chemistries  Recent Labs  Lab 11/17/18 1311  NA 135  K 4.3  CL 102  CO2 23  GLUCOSE 161*  BUN 28*  CREATININE 0.82  CALCIUM 9.2  AST 32  ALT 49*  ALKPHOS 123  BILITOT 0.8    Cardiac Enzymes No results for input(s): TROPONINI in the last 168 hours.  Microbiology Results  Results for orders placed or performed during the hospital encounter of 11/17/18  SARS Coronavirus 2 [facility] order, Performed in [facility] hospital lab) Nasopharyngeal Nasopharyngeal Swab     Status: None   Collection Time: 11/17/18  1:14 PM   Specimen:  Nasopharyngeal Swab  Result Value Ref Range Status   SARS Coronavirus 2 NEGATIVE NEGATIVE Final    Comment: (NOTE) If result is NEGATIVE SARS-CoV-2 target nucleic acids are NOT DETECTED. The SARS-CoV-2 RNA is generally detectable in upper and lower  respiratory specimens during the acute phase of infection. The lowest  concentration of SARS-CoV-2 viral copies this assay can detect is 250  copies / mL. A negative result does not preclude SARS-CoV-2 infection  and should not be used as the sole basis for treatment or other  patient management decisions.  A negative result may occur with  improper specimen collection / handling, submission of specimen other  than nasopharyngeal swab, presence of viral mutation(s) within the  areas targeted by this assay, and inadequate number of viral copies  (<250 copies / mL). A negative result must be combined with clinical  observations, patient history, and epidemiological information. If result is POSITIVE SARS-CoV-2 target nucleic acids are DETECTED. The SARS-CoV-2 RNA is generally detectable in upper and lower  respiratory specimens dur ing the acute phase of infection.  Positive  results are indicative of active infection with SARS-CoV-2.  Clinical  correlation with patient history and other diagnostic information is  necessary to determine patient infection status.  Positive results do  not rule out bacterial infection or co-infection with other viruses. If result is PRESUMPTIVE POSTIVE SARS-CoV-2 nucleic acids MAY BE PRESENT.   A presumptive positive result was obtained on the submitted specimen  and confirmed on repeat testing.  While 2019 novel coronavirus  (SARS-CoV-2) nucleic acids may be present in the submitted sample  additional confirmatory testing may be necessary for epidemiological  and / or clinical management purposes  to differentiate between  SARS-CoV-2 and other Sarbecovirus currently known to infect humans.  If clinically  indicated additional testing with an alternate test  methodology [phone]) is advised. The SARS-CoV-2 RNA is generally  detectable in upper and lower respiratory sp ecimens during the acute  phase of infection. The expected result is Negative. Fact Sheet for Patients:  StrictlyIdeas.no Fact Sheet for Healthcare Providers: BankingDealers.co.za This test is not yet approved or cleared by the [place] FDA and has been authorized for detection and/or diagnosis of SARS-CoV-2 by FDA under an Emergency Use Authorization (EUA).  This EUA will remain in effect (meaning this test can be used) for the duration of the COVID-19 declaration under Section 564(b)(1) of the Act, 21 U.S.C. section 360bbb-3(b)(1), unless the authorization is terminated or  revoked sooner. Performed at [facility], [address] [place], [place] [address]   Respiratory Panel by PCR     Status: None   Collection Time: 11/17/18  5:53 PM   Specimen: Nasopharyngeal Swab; Respiratory  Result Value Ref Range Status   Adenovirus NOT DETECTED NOT DETECTED Final   Coronavirus 229E NOT DETECTED NOT DETECTED Final    Comment: (NOTE) The Coronavirus on the Respiratory Panel, DOES NOT test for the novel  Coronavirus (2019 nCoV)    Coronavirus HKU1 NOT DETECTED NOT DETECTED Final   Coronavirus NL63 NOT DETECTED NOT DETECTED Final   Coronavirus OC43 NOT DETECTED NOT DETECTED Final   Metapneumovirus NOT DETECTED NOT DETECTED Final   Rhinovirus / Enterovirus NOT DETECTED NOT DETECTED Final   Influenza A NOT DETECTED NOT DETECTED Final   Influenza B NOT DETECTED NOT DETECTED Final   Parainfluenza Virus 1 NOT DETECTED NOT DETECTED Final   Parainfluenza Virus 2 NOT DETECTED NOT DETECTED Final   Parainfluenza Virus 3 NOT DETECTED NOT DETECTED Final   Parainfluenza Virus 4 NOT DETECTED NOT DETECTED Final   Respiratory Syncytial Virus NOT DETECTED NOT DETECTED Final    Bordetella pertussis NOT DETECTED NOT DETECTED Final   Chlamydophila pneumoniae NOT DETECTED NOT DETECTED Final   Mycoplasma pneumoniae NOT DETECTED NOT DETECTED Final    Comment: Performed at [facility], [address] [place], [place] [address]  SARS Coronavirus 2 by RT PCR (hospital order, performed in [facility] hospital lab) Nasopharyngeal Nasopharyngeal Swab     Status: None   Collection Time: 11/17/18  6:36 PM   Specimen: Nasopharyngeal Swab  Result Value Ref Range Status   SARS Coronavirus 2 NEGATIVE NEGATIVE Final    Comment: (NOTE) If result is NEGATIVE SARS-CoV-2 target nucleic acids are NOT DETECTED. The SARS-CoV-2 RNA is generally detectable in upper and lower  respiratory specimens during the acute phase of infection. The lowest  concentration of SARS-CoV-2 viral copies this assay can detect is 250  copies / mL. A negative result does not preclude SARS-CoV-2 infection  and should not be used as the sole basis for treatment or other  patient management decisions.  A negative result may occur with  improper specimen collection / handling, submission of specimen other  than nasopharyngeal swab, presence of viral mutation(s) within the  areas targeted by this assay, and inadequate number of viral copies  (<250 copies / mL). A negative result must be combined with clinical  observations, patient history, and epidemiological information. If result is POSITIVE SARS-CoV-2 target nucleic acids are DETECTED. The SARS-CoV-2 RNA is generally detectable in upper and lower  respiratory specimens dur ing the acute phase of infection.  Positive  results are indicative of active infection with SARS-CoV-2.  Clinical  correlation with patient history and other diagnostic information is  necessary to determine patient infection status.  Positive results do  not rule out bacterial infection or co-infection with other viruses. If result is PRESUMPTIVE POSTIVE SARS-CoV-2 nucleic  acids MAY BE PRESENT.   A presumptive positive result was obtained on the submitted specimen  and confirmed on repeat testing.  While 2019 novel coronavirus  (SARS-CoV-2) nucleic acids may be present in the submitted sample  additional confirmatory testing may be necessary for epidemiological  and / or clinical management purposes  to differentiate between  SARS-CoV-2 and other Sarbecovirus currently known to infect humans.  If clinically indicated additional testing with an alternate test  methodology [phone]) is advised. The SARS-CoV-2  RNA is generally  detectable in upper and lower respiratory sp ecimens during the acute  phase of infection. The expected result is Negative. Fact Sheet for Patients:  BoilerBrush.com.cy Fact Sheet for Healthcare Providers: https://pope.com/ This test is not yet approved or cleared by the [place] FDA and has been authorized for detection and/or diagnosis of SARS-CoV-2 by FDA under an Emergency Use Authorization (EUA).  This EUA will remain in effect (meaning this test can be used) for the duration of the COVID-19 declaration under Section 564(b)(1) of the Act, 21 U.S.C. section 360bbb-3(b)(1), unless the authorization is terminated or revoked sooner. Performed at [facility], [address], [place], [place] [address]     RADIOLOGY:  No results found.  EKG:   Orders placed or performed during the hospital encounter of 11/17/18  . EKG 12-Lead  . EKG 12-Lead  . ED EKG  . ED EKG      Management plans discussed with the patient, family and they are in agreement.  CODE STATUS:     Code Status Orders  (From admission, onward)         Start     Ordered   11/17/18 1955  Full code  Continuous     11/17/18 1955        Code Status History    This patient has a current code status but no historical code status.   Advance Care Planning Activity      TOTAL TIME TAKING  CARE OF THIS PATIENT: 45 minutes.    Altamese Dilling M.D on 11/20/2018 at 11:57 AM  Between 7am to 6pm - Pager - [phone]  After 6pm go to [email or web address] - password [email or web address]  [facility]  Office  [phone]  CC: Primary care physician; Dorcas Carrow, DO   Note: This dictation was prepared with Dragon dictation along with smaller phrase technology. Any transcriptional errors that result from this process are unintentional.

## 2018-11-19 NOTE — Progress Notes (Signed)
Patient discharged to home with spouse. Tele and IV d/c'd. Patient verbalizes understanding of discharge instructions. 

## 2018-11-20 LAB — LEGIONELLA PNEUMOPHILA SEROGP 1 UR AG: L. pneumophila Serogp 1 Ur Ag: NEGATIVE

## 2018-11-21 ENCOUNTER — Telehealth: Payer: Self-pay

## 2018-11-21 NOTE — Telephone Encounter (Signed)
Copied from [facility] [id number]. Topic: General - Other >> Nov 21, 2018  1:41 PM Pauline Good wrote: Reason for CRM: pt want a prescription for a nicotine patch. Called into [facility]/  21mg  patch   Routing to provider.

## 2018-11-21 NOTE — Telephone Encounter (Signed)
No TCM call made, patient has [employer], not eligible for TCM

## 2018-11-22 MED ORDER — NICOTINE 21 MG/24HR TD PT24: 21.0000 mg | MEDICATED_PATCH | Freq: Every day | TRANSDERMAL | 0 refills | Status: DC

## 2018-11-22 NOTE — Telephone Encounter (Signed)
Spoke with pts husband and he stated that they would call back to schedule an appt.

## 2018-11-22 NOTE — Telephone Encounter (Signed)
Needs hospital follow up ASAP please.

## 2018-11-24 ENCOUNTER — Ambulatory Visit: Payer: BLUE CROSS/BLUE SHIELD | Admitting: Nurse Practitioner

## 2018-11-28 ENCOUNTER — Encounter: Payer: Self-pay | Admitting: Family Medicine

## 2018-11-28 ENCOUNTER — Other Ambulatory Visit: Payer: Self-pay

## 2018-11-28 ENCOUNTER — Ambulatory Visit (INDEPENDENT_AMBULATORY_CARE_PROVIDER_SITE_OTHER): Payer: BLUE CROSS/BLUE SHIELD | Admitting: Family Medicine

## 2018-11-28 VITALS — BP 134/87 | HR 96 | Temp 99.3°F | Ht 64.0 in | Wt 156.0 lb

## 2018-11-28 DIAGNOSIS — I429 Cardiomyopathy, unspecified: Secondary | ICD-10-CM | POA: Diagnosis not present

## 2018-11-28 DIAGNOSIS — I700 Atherosclerosis of aorta: Secondary | ICD-10-CM

## 2018-11-28 DIAGNOSIS — I671 Cerebral aneurysm, nonruptured: Secondary | ICD-10-CM | POA: Diagnosis not present

## 2018-11-28 DIAGNOSIS — I10 Essential (primary) hypertension: Secondary | ICD-10-CM

## 2018-11-28 DIAGNOSIS — D649 Anemia, unspecified: Secondary | ICD-10-CM

## 2018-11-28 DIAGNOSIS — F5101 Primary insomnia: Secondary | ICD-10-CM

## 2018-11-28 DIAGNOSIS — J189 Pneumonia, unspecified organism: Secondary | ICD-10-CM

## 2018-11-28 DIAGNOSIS — E0590 Thyrotoxicosis, unspecified without thyrotoxic crisis or storm: Secondary | ICD-10-CM

## 2018-11-28 DIAGNOSIS — Z13220 Encounter for screening for lipoid disorders: Secondary | ICD-10-CM

## 2018-11-28 LAB — UA/M W/RFLX CULTURE, ROUTINE
Bilirubin, UA: NEGATIVE
Glucose, UA: NEGATIVE
Ketones, UA: NEGATIVE
Leukocytes,UA: NEGATIVE
Nitrite, UA: NEGATIVE
Protein,UA: NEGATIVE
RBC, UA: NEGATIVE
Specific Gravity, UA: 1.02 (ref 1.005–1.030)
Urobilinogen, Ur: 0.2 mg/dL (ref 0.2–1.0)
pH, UA: 7 (ref 5.0–7.5)

## 2018-11-28 LAB — MICROALBUMIN, URINE WAIVED
Creatinine, Urine Waived: 10 mg/dL (ref 10–300)
Microalb, Ur Waived: 10 mg/L (ref 0–19)

## 2018-11-28 MED ORDER — FUROSEMIDE 20 MG PO TABS: 40.0000 mg | ORAL_TABLET | Freq: Every day | ORAL | 1 refills | Status: DC

## 2018-11-28 MED ORDER — SPIRONOLACTONE 25 MG PO TABS: 25.0000 mg | ORAL_TABLET | Freq: Every day | ORAL | 1 refills | Status: DC

## 2018-11-28 MED ORDER — ALBUTEROL SULFATE HFA 108 (90 BASE) MCG/ACT IN AERS: INHALATION_SPRAY | RESPIRATORY_TRACT | 6 refills | Status: DC

## 2018-11-28 MED ORDER — COMBIVENT RESPIMAT 20-100 MCG/ACT IN AERS: 1.0000 | INHALATION_SPRAY | Freq: Four times a day (QID) | RESPIRATORY_TRACT | 1 refills | Status: DC

## 2018-11-28 NOTE — Progress Notes (Signed)
BP 134/87   Pulse 96   Temp 99.3 F (37.4 C) (Oral)   Ht  (1.626 m)   Wt 156 lb (70.8 kg)   LMP  (LMP Unknown)   SpO2 99%   BMI 26.78 kg/m    Subjective:    Patient ID: Jasmine Camacho, female    DOB: Jul 16, 1955, 63 y.o.   MRN: [id number]  HPI: Jasmine Camacho is a 63 y.o. female  Chief Complaint  Patient presents with  . Hospitalization Follow-up   Transition of Care Hospital Follow up.   Hospital/Facility: [facility] D/C Physician: Dr. Elisabeth Pigeon D/C Date: 11/19/18  Records Requested: 11/19/18 Records Received: 11/19/18 Records Reviewed: 11/19/18  Diagnoses on Discharge: Acute respiratory failure due to atypical pneumonia  Date of interactive Contact within 48 hours of discharge:  11/21/18 Contact was through: phone  Date of 7 day or 14 day face-to-face visit:  11/28/18  within 14 days  Outpatient Encounter Medications as of 11/28/2018  Medication Sig  . acetaminophen (TYLENOL 8 HOUR ARTHRITIS PAIN) 650 MG CR tablet Take 650 mg by mouth every 8 (eight) hours as needed (pain or fever).   Marland Kitchen albuterol (VENTOLIN HFA) 108 (90 Base) MCG/ACT inhaler INL 2 PFS PO Q 4 TO 6 H PRN  . Eszopiclone 3 MG TABS Take 1 tablet (3 mg total) by mouth at bedtime. Take immediately before bedtime  . furosemide (LASIX) 20 MG tablet Take 2 tablets (40 mg total) by mouth daily.  . Ipratropium-Albuterol (COMBIVENT RESPIMAT) 20-100 MCG/ACT AERS respimat Inhale 1 puff into the lungs every 6 (six) hours.  . methimazole (TAPAZOLE) 5 MG tablet Take 5 mg by mouth daily.   Marland Kitchen spironolactone (ALDACTONE) 25 MG tablet Take 1 tablet (25 mg total) by mouth daily.  . [DISCONTINUED] albuterol (VENTOLIN HFA) 108 (90 Base) MCG/ACT inhaler INL 2 PFS PO Q 4 TO 6 H PRN (Patient taking differently: Inhale 2 puffs into the lungs every 4 (four) hours as needed for wheezing or shortness of breath. )  . [DISCONTINUED] furosemide (LASIX) 20 MG tablet Take 2 tablets (40 mg total) by mouth daily.  . [DISCONTINUED]  Ipratropium-Albuterol (COMBIVENT RESPIMAT) 20-100 MCG/ACT AERS respimat Inhale 1 puff into the lungs every 6 (six) hours.  . [DISCONTINUED] spironolactone (ALDACTONE) 25 MG tablet Take 1 tablet (25 mg total) by mouth daily.  . nicotine (NICODERM CQ) 21 mg/24hr patch Place 1 patch (21 mg total) onto the skin daily. (Patient not taking: Reported on 11/29/2018)   No facility-administered encounter medications on file as of 11/28/2018.   Per hospitalist: " HOSPITAL COURSE:   Patient is a 63 year old white female with history of hypertension, hyperthyroidism who is presenting to the hospital with complaints of shortness of breath  1.Acute respiratory failure due to atypical pneumonia  IV ceftriaxone azithromycin Appreciate pulmonary consult.  Nebs steroids Felt much better.  No need for tapering steroid at home will give antibiotic and inhalers.  2. Hyperthyrodism -continue Tapazole  3.Hypertension continueverapamil.  4.Nicotine abuse smoking cessation provided 4 minutes spent strongly recommend she stop smoking nicotine patch will be started  5.  Severe cardiomyopathy Elevated BNP Patient does not have any cardiac history.  Call cardiology consult.  Suggested to start on Spironolactone and stop verapamil. Advised to follow in cardiology clinic."  Diagnostic Tests Reviewed CLINICAL DATA:  Shortness of breath  EXAM: PORTABLE CHEST 1 VIEW  COMPARISON:  07/12/2018  FINDINGS: Cardiomegaly. There is subtle bilateral interstitial and heterogeneous airspace opacity, most conspicuous in the left midlung.  There are probable small bilateral pleural effusions. The visualized skeletal structures are unremarkable.  IMPRESSION: Cardiomegaly. There is subtle bilateral interstitial and heterogeneous airspace opacity, most conspicuous in the left midlung. There are probable small bilateral pleural effusions. Findings are most consistent with edema although infection is  a differential consideration.  CLINICAL DATA:  Intermediate probability for pulmonary embolism. Elevated D-dimer with shortness of breath 1 week. Negative COVID-19 test 1 week ago.  EXAM: CT ANGIOGRAPHY CHEST WITH CONTRAST  TECHNIQUE: Multidetector CT imaging of the chest was performed using the standard protocol during bolus administration of intravenous contrast. Multiplanar CT image reconstructions and MIPs were obtained to evaluate the vascular anatomy.  CONTRAST:  28mL OMNIPAQUE IOHEXOL 350 MG/ML SOLN  COMPARISON:  None.  FINDINGS: Cardiovascular: Mild cardiomegaly. Ascending thoracic aorta is normal in caliber as is mild ectasia of the descending thoracic aorta measuring 3.7 cm in AP diameter. Calcified atherosclerotic plaque is present over the thoracic aorta. Pulmonary arterial system is well opacified without evidence of emboli. There is calcified plaque over the left main and 3 vessel coronary arteries. Remaining vascular structures are unremarkable.  Mediastinum/Nodes: Possible low-density 1.8 cm lymph node over the precarinal region. Mild low-density lymphoid tissue over the hilar regions. Low density material over the subcarinal region likely lymphoid tissue without well-defined lymph node. Mild amount of fluid within the distal esophagus which may be due to reflux or dysmotility.  Lungs/Pleura: Lungs are adequately inflated and demonstrate small bilateral pleural effusions with minimal atelectasis over the mid to lower lungs. There is subtle patchy peripheral hazy opacification bilaterally which may be due to an infectious, atypical infectious or inflammatory process. Airways are normal.  Upper Abdomen: No acute findings. Calcified plaque over the abdominal aorta.  Musculoskeletal: Degenerative changes of the spine.  Review of the MIP images confirms the above findings.  IMPRESSION: 1.  No evidence of pulmonary embolism.  2. Subtle hazy  patchy bilateral peripheral airspace opacification which may be due to infectious, atypical infectious or inflammatory process. Small bilateral pleural effusions with associated bibasilar atelectasis. Mild mediastinal adenopathy likely reactive.  3. Aortic Atherosclerosis (ICD10-I70.0). Atherosclerotic coronary artery disease. Mild ectasia of the descending thoracic aorta measuring 3.7 cm.  CLINICAL DATA:  Intracranial hemorrhage, known, follow-up. Additional history provided: Brain aneurysm with hemorrhage 15 years ago. Additional history obtained from ER provider as shortness of breath, COVID  EXAM: CT ANGIOGRAPHY HEAD  TECHNIQUE: Multidetector CT imaging of the head was performed using the standard protocol during bolus administration of intravenous contrast. Multiplanar CT image reconstructions and MIPs were obtained to evaluate the vascular anatomy.  CONTRAST:  98mL OMNIPAQUE IOHEXOL 350 MG/ML SOLN  COMPARISON:  Report from noncontrast head CT 09/21/2002 (images unavailable).  FINDINGS: CT HEAD  Brain:  Embolization coil masses in the region of the distal left ICA/M1 left MCA. Streak artifact limits evaluation of surrounding structures.  There are chronic regions of encephalomalacia/gliosis within the left frontal lobe, parietal lobe and posterosuperior left temporal lobe in the left MCA vascular territory, likely reflecting chronic infarcts. Associated ex vacuo dilatation of the left lateral ventricle. There is also abnormal hypodensity within the anterior right frontal lobe periventricular white matter with associated volume loss, consistent with chronic white matter infarct. Associated ex vacuo dilatation of the right lateral ventricle frontal horn. Additional chronic appearing infarct within the left cerebellum.  No evidence of acute intracranial hemorrhage. No midline shift or extra-axial fluid collection. Cerebral volume is normal for age   Vascular: Reported separately  Skull: No calvarial  fracture or suspicious calvarial lesion  Sinuses: No significant paranasal sinus disease or mastoid effusion at the imaged levels.  Orbits: Visualized orbits demonstrate no acute abnormality.  CTA HEAD  Anterior circulation:  Calcified and noncalcified atherosclerotic plaque within the intracranial right internal carotid artery. Resultant moderate/advanced stenosis within the distal cavernous/paraclinoid right ICA (series 11, image 71). Additional sites of mild stenosis more proximally within the intracranial right ICA.  Calcified and noncalcified plaque within the intracranial left internal carotid artery. Apparent moderate/advanced stenosis within the distal cavernous/paraclinoid left internal carotid artery. Additional sites of mild stenosis more proximally within the intracranial left ICA.  The right middle and anterior cerebral arteries are patent without significant proximal stenosis.  Streak artifact from embolization coil masses obscures the left carotid artery terminus and the proximal M1 left middle cerebral artery. The M1 left middle cerebral artery appears patent. The left anterior cerebral artery is patent without significant proximal stenosis.  2 mm aneurysm versus infundibulum projecting inferiorly from the supraclinoid right ICA (series 7, image 53). Streak artifact limits evaluation for residual/recurrent aneurysm at the site of the embolization coil masses.  Posterior circulation:  The intracranial vertebral arteries are patent without significant stenosis. Proximal flow is seen within the PICA vessels bilaterally. Flow is seen within the right AICA proximally. The left AICA is poorly delineated.  The basilar artery is patent without significant stenosis. Flow is seen within the proximal superior cerebellar arteries bilaterally.  The bilateral posterior cerebral arteries are patent  without significant proximal stenosis  Venous sinuses: Within limitations of contrast timing, no convincing thrombus.  Anatomic variants: None significant  IMPRESSION: CT head:  1. No CT evidence of acute intracranial abnormality. 2. Chronic infarcts within the left MCA vascular territory, within the right frontal lobe periventricular white matter and left cerebellum.  CTA head:  1. Atherosclerotic disease within the intracranial internal carotid arteries with multifocal stenoses. Most notably, there are moderate/advanced stenoses within the distal cavernous/paraclinoid internal carotid arteries bilaterally. 2. The left AICA is poorly delineated. This may be due to small vessel size and/or stenosis. 3. Embolization coil masses in the region of the left ICA terminus and proximal M1 left MCA. Correlate with procedural history. Associated streak artifact limits evaluation for residual/recurrent aneurysm at these sites. 4. 2 mm aneurysm versus infundibulum of the supraclinoid right ICA.  Result status: Final result    ECHOCARDIOGRAM REPORT       Patient Name:   Jasmine Camacho Date of Exam: 11/18/2018 Medical Rec #:  [id number]     Height:       64.0 in Accession #:    [id number]    Weight:       158.4 lb Date of Birth:  August 27, 1955     BSA:          1.77 m Patient Age:    63 years      BP:           122/107 mmHg Patient Gender: F             HR:           97 bpm. Exam Location:  [facility]  Procedure: 2D Echo, Color Doppler and Cardiac Doppler  Indications:     I50.21 CHF-Acute Systolic   History:         Patient has no prior history of Echocardiogram examinations.                  Arrythmias:PVC and Tachycardia Risk Factors:Hypertension.  Sonographer:     Charmayne Sheer RDCS (AE) Referring Phys:  [id number] Ottie Glazier Diagnosing Phys: Ida Rogue MD  IMPRESSIONS    1. Left ventricular ejection fraction, by visual estimation, is 20 to 25%. The left ventricle  has normal function. Mildly increased left ventricular size. There is no left ventricular hypertrophy.Global hypokinesis.  2. Left ventricular diastolic Doppler parameters are consistent with pseudonormalization pattern of LV diastolic filling.  3. Global right ventricle has normal systolic function.The right ventricular size is normal. No increase in right ventricular wall thickness.  4. Left atrial size was mildly dilated.  5. Mildly elevated pulmonary artery systolic pressure.  FINDINGS  Left Ventricle: Left ventricular ejection fraction, by visual estimation, is 20 to 25%. The left ventricle has normal function. There is no left ventricular hypertrophy. Mildly increased left ventricular size. Spectral Doppler shows Left ventricular  diastolic Doppler parameters are consistent with pseudonormalization pattern of LV diastolic filling.  Right Ventricle: The right ventricular size is normal. No increase in right ventricular wall thickness. Global RV systolic function is has normal systolic function. The tricuspid regurgitant velocity is 2.44 m/s, and with an assumed right atrial pressure  of 10 mmHg, the estimated right ventricular systolic pressure is mildly elevated at 33.8 mmHg.  Left Atrium: Left atrial size was mildly dilated.  Right Atrium: Right atrial size was mildly dilated  Pericardium: There is no evidence of pericardial effusion.  Mitral Valve: The mitral valve is normal in structure. No evidence of mitral valve stenosis by observation. MV peak gradient, 6.7 mmHg. Mild to moderate mitral valve regurgitation.  Tricuspid Valve: The tricuspid valve is normal in structure. Tricuspid valve regurgitation is mild by color flow Doppler.  Aortic Valve: The aortic valve is normal in structure. Aortic valve regurgitation was not visualized by color flow Doppler. Mild to moderate aortic valve sclerosis/calcification is present, without any evidence of aortic stenosis. Aortic valve mean   gradient measures 2.0 mmHg. Aortic valve peak gradient measures 3.9 mmHg. Aortic valve area, by VTI measures 2.85 cm.  Pulmonic Valve: The pulmonic valve was normal in structure. Pulmonic valve regurgitation is trivial by color flow Doppler.  Aorta: The aortic root, ascending aorta and aortic arch are all structurally normal, with no evidence of dilitation or obstruction.  Venous: The inferior vena cava is normal in size with greater than 50% respiratory variability, suggesting right atrial pressure of 3 mmHg.  IAS/Shunts: No atrial level shunt detected by color flow Doppler. No ventricular septal defect is seen or detected. There is no evidence of an atrial septal defect.     LEFT VENTRICLE PLAX 2D LVIDd:         6.19 cm       Diastology LVIDs:         4.98 cm       LV e' lateral:   3.81 cm/s LV PW:         1.14 cm       LV E/e' lateral: 27.6 LV IVS:        0.98 cm       LV e' medial:    5.11 cm/s LVOT diam:     2.00 cm       LV E/e' medial:  20.5 LV SV:         76 ml LV SV Index:   41.85 LVOT Area:     3.14 cm   LV Volumes (MOD) LV area d, A2C:    40.30 cm LV area d, A4C:  38.70 cm LV area s, A2C:    31.60 cm LV area s, A4C:    26.30 cm LV major d, A2C:   8.46 cm LV major d, A4C:   7.99 cm LV major s, A2C:   7.86 cm LV major s, A4C:   7.50 cm LV vol d, MOD A2C: 161.0 ml LV vol d, MOD A4C: 151.0 ml LV vol s, MOD A2C: 110.0 ml LV vol s, MOD A4C: 77.4 ml LV SV MOD A2C:     51.0 ml LV SV MOD A4C:     151.0 ml LV SV MOD BP:      65.7 ml  RIGHT VENTRICLE RV Basal diam:  3.77 cm  LEFT ATRIUM             Index       RIGHT ATRIUM           Index LA diam:        3.80 cm 2.14 cm/m  RA Area:     16.70 cm LA Vol (A2C):   59.7 ml 33.69 ml/m RA Volume:   48.70 ml  27.49 ml/m LA Vol (A4C):   69.3 ml 39.11 ml/m LA Biplane Vol: 64.4 ml 36.35 ml/m  AORTIC VALVE                   PULMONIC VALVE AV Area (Vmax):    2.79 cm    PV Vmax:       0.60 m/s AV Area  (Vmean):   2.72 cm    PV Vmean:      36.900 cm/s AV Area (VTI):     2.85 cm    PV VTI:        0.073 m AV Vmax:           98.30 cm/s  PV Peak grad:  1.5 mmHg AV Vmean:          65.500 cm/s PV Mean grad:  1.0 mmHg AV VTI:            0.141 m AV Peak Grad:      3.9 mmHg AV Mean Grad:      2.0 mmHg LVOT Vmax:         87.20 cm/s LVOT Vmean:        56.800 cm/s LVOT VTI:          0.128 m LVOT/AV VTI ratio: 0.91   AORTA Ao Root diam: 3.10 cm  MITRAL VALVE                         TRICUSPID VALVE MV Area (PHT): 8.25 cm              TR Peak grad:   23.8 mmHg MV Peak grad:  6.7 mmHg              TR Vmax:        244.00 cm/s MV Mean grad:  3.0 mmHg MV Vmax:       1.29 m/s              SHUNTS MV Vmean:      85.9 cm/s             Systemic VTI:  0.13 m MV VTI:        0.25 m                Systemic Diam: 2.00 cm MV PHT:  26.68 msec MV Decel Time: 92 msec MV E velocity: 105.00 cm/s 103 cm/s MV A velocity: 79.50 cm/s  70.3 cm/s MV E/A ratio:  1.32        1.5    Julien Nordmannimothy Gollan MD Electronically signed by Julien Nordmannimothy Gollan MD Signature Date/Time: 11/18/2018/5:12:49 PM       Final     Disposition:  Home  Consults: Cardiology  Discharge Instructions:  Follow up here and with cardiology  Since getting out of the hospital, Jasmine Camacho has been doing OK. Has continued with SOB. She has been using her inhaler. Has been using her spironalactone. Has been coughing a lot.   Has been having bad sinuses for the past couple of day- having a lot of coughing.   Relevant past medical, surgical, family and social history reviewed and updated as indicated. Interim medical history since our last visit reviewed. Allergies and medications reviewed and updated.  Review of Systems  Constitutional: Negative.   HENT: Negative.   Respiratory: Positive for cough. Negative for apnea, choking, chest tightness, shortness of breath, wheezing and stridor.   Cardiovascular: Negative.   Psychiatric/Behavioral:  Negative.     Per HPI unless specifically indicated above     Objective:    BP 134/87   Pulse 96   Temp 99.3 F (37.4 C) (Oral)   Ht 5\' 4"  (1.626 m)   Wt 156 lb (70.8 kg)   LMP  (LMP Unknown)   SpO2 99%   BMI 26.78 kg/m   Wt Readings from Last 3 Encounters:  11/29/18 155 lb 8 oz (70.5 kg)  11/28/18 156 lb (70.8 kg)  11/19/18 157 lb 9.6 oz (71.5 kg)    Physical Exam Vitals signs and nursing note reviewed.  Constitutional:      General: She is not in acute distress.    Appearance: Normal appearance. She is not ill-appearing, toxic-appearing or diaphoretic.  HENT:     Head: Normocephalic and atraumatic.     Right Ear: External ear normal.     Left Ear: External ear normal.     Nose: Nose normal.     Mouth/Throat:     Mouth: Mucous membranes are moist.     Pharynx: Oropharynx is clear.  Eyes:     General: No scleral icterus.       Right eye: No discharge.        Left eye: No discharge.     Extraocular Movements: Extraocular movements intact.     Conjunctiva/sclera: Conjunctivae normal.     Pupils: Pupils are equal, round, and reactive to light.  Neck:     Musculoskeletal: Normal range of motion and neck supple.  Cardiovascular:     Rate and Rhythm: Normal rate and regular rhythm.     Pulses: Normal pulses.     Heart sounds: Normal heart sounds. No murmur. No friction rub. No gallop.   Pulmonary:     Effort: Pulmonary effort is normal. No respiratory distress.     Breath sounds: Normal breath sounds. No stridor. No wheezing, rhonchi or rales.  Chest:     Chest wall: No tenderness.  Musculoskeletal: Normal range of motion.  Skin:    General: Skin is warm and dry.     Capillary Refill: Capillary refill takes less than 2 seconds.     Coloration: Skin is not jaundiced or pale.     Findings: No bruising, erythema, lesion or rash.  Neurological:     General: No focal deficit present.  Mental Status: She is alert and oriented to person, place, and time. Mental  status is at baseline.  Psychiatric:        Mood and Affect: Mood normal.        Behavior: Behavior normal.        Thought Content: Thought content normal.        Judgment: Judgment normal.     Results for orders placed or performed in visit on 11/28/18  UA/M w/rflx Culture, Routine   Specimen: Urine   URINE  Result Value Ref Range   Specific Gravity, UA 1.020 1.005 - 1.030   pH, UA 7.0 5.0 - 7.5   Color, UA Yellow Yellow   Appearance Ur Clear Clear   Leukocytes,UA Negative Negative   Protein,UA Negative Negative/Trace   Glucose, UA Negative Negative   Ketones, UA Negative Negative   RBC, UA Negative Negative   Bilirubin, UA Negative Negative   Urobilinogen, Ur 0.2 0.2 - 1.0 mg/dL   Nitrite, UA Negative Negative  TSH  Result Value Ref Range   TSH 4.020 0.450 - 4.500 uIU/mL  Microalbumin, Urine Waived  Result Value Ref Range   Microalb, Ur Waived 10 0 - 19 mg/L   Creatinine, Urine Waived 10 10 - 300 mg/dL   Microalb/Creat Ratio 30-300 (H) <30 mg/g  Lipid Panel w/o Chol/HDL Ratio  Result Value Ref Range   Cholesterol, Total 153 100 - 199 mg/dL   Triglycerides 68 0 - 149 mg/dL   HDL 33 (L) >92 mg/dL   VLDL Cholesterol Cal 14 5 - 40 mg/dL   LDL Chol Calc (NIH) 010 (H) 0 - 99 mg/dL  CBC with Differential/Platelet  Result Value Ref Range   WBC 8.2 3.4 - 10.8 x10E3/uL   RBC 3.42 (L) 3.77 - 5.28 x10E6/uL   Hemoglobin 9.7 (L) 11.1 - 15.9 g/dL   Hematocrit 07.1 (L) 21.9 - 46.6 %   MCV 86 79 - 97 fL   MCH 28.4 26.6 - 33.0 pg   MCHC 33.0 31.5 - 35.7 g/dL   RDW 75.8 83.2 - 54.9 %   Platelets 359 150 - 450 x10E3/uL   Neutrophils 67 Not Estab. %   Lymphs 17 Not Estab. %   Monocytes 13 Not Estab. %   Eos 2 Not Estab. %   Basos 1 Not Estab. %   Neutrophils Absolute 5.5 1.4 - 7.0 x10E3/uL   Lymphocytes Absolute 1.4 0.7 - 3.1 x10E3/uL   Monocytes Absolute 1.0 (H) 0.1 - 0.9 x10E3/uL   EOS (ABSOLUTE) 0.2 0.0 - 0.4 x10E3/uL   Basophils Absolute 0.1 0.0 - 0.2 x10E3/uL    Immature Granulocytes 0 Not Estab. %   Immature Grans (Abs) 0.0 0.0 - 0.1 x10E3/uL  Comprehensive metabolic panel  Result Value Ref Range   Glucose 66 65 - 99 mg/dL   BUN 14 8 - 27 mg/dL   Creatinine, Ser 8.26 0.57 - 1.00 mg/dL   GFR calc non Af Amer 77 >59 mL/min/1.73   GFR calc Af Amer 89 >59 mL/min/1.73   BUN/Creatinine Ratio 17 12 - 28   Sodium 135 134 - 144 mmol/L   Potassium 4.2 3.5 - 5.2 mmol/L   Chloride 99 96 - 106 mmol/L   CO2 22 20 - 29 mmol/L   Calcium 8.7 8.7 - 10.3 mg/dL   Total Protein 6.5 6.0 - 8.5 g/dL   Albumin 3.7 (L) 3.8 - 4.8 g/dL   Globulin, Total 2.8 1.5 - 4.5 g/dL   Albumin/Globulin Ratio 1.3 1.2 -  2.2   Bilirubin Total 0.4 0.0 - 1.2 mg/dL   Alkaline Phosphatase 121 (H) 39 - 117 IU/L   AST 47 (H) 0 - 40 IU/L   ALT 121 (H) 0 - 32 IU/L  Iron and TIBC  Result Value Ref Range   Total Iron Binding Capacity 329 250 - 450 ug/dL   UIBC 161 096 - 045 ug/dL   Iron 19 (L) 27 - 409 ug/dL   Iron Saturation 6 (LL) 15 - 55 %  Ferritin  Result Value Ref Range   Ferritin 141 15 - 150 ng/mL  Specimen status report  Result Value Ref Range   specimen status report Comment       Assessment & Plan:   Problem List Items Addressed This Visit      Cardiovascular and Mediastinum   Hypertension    Under good control on current regimen. Continue current regimen. Continue to monitor. Call with any concerns. Refills given. Labs drawn today.        Relevant Medications   spironolactone (ALDACTONE) 25 MG tablet   furosemide (LASIX) 20 MG tablet   Cardiomyopathy (HCC)    To be seeing cardiology this week. Call with any concerns. Continue to monitor. Call with any concerns.       Relevant Medications   spironolactone (ALDACTONE) 25 MG tablet   furosemide (LASIX) 20 MG tablet   Brain aneurysm    Known. Has seen neurosurgery at [facility]. Continue to monitor. Call with any concerns.       Relevant Medications   spironolactone (ALDACTONE) 25 MG tablet   furosemide (LASIX)  20 MG tablet   Aortic atherosclerosis (HCC)    Will keep BP and cholesterol under good control. Continue to monitor call with any concerns.      Relevant Medications   spironolactone (ALDACTONE) 25 MG tablet   furosemide (LASIX) 20 MG tablet     Endocrine   Hyperthyroidism    Continue to follow with endocrine. Call with any concerns. Continue to monitor.         Other   Insomnia    Stable. Not due for refills at this time. Continue to monitor. Call with any concerns.        Other Visit Diagnoses    Atypical pneumonia    -  Primary   Still coughing- has finished her antibiotics. Continue to monitor. Call with any concerns.    Screening for cholesterol level       Will get labs drawn. Await results.        Follow up plan: Return As scheduled.

## 2018-11-29 ENCOUNTER — Encounter: Payer: Self-pay | Admitting: Internal Medicine

## 2018-11-29 ENCOUNTER — Other Ambulatory Visit: Payer: Self-pay | Admitting: Family Medicine

## 2018-11-29 ENCOUNTER — Ambulatory Visit (INDEPENDENT_AMBULATORY_CARE_PROVIDER_SITE_OTHER): Payer: BLUE CROSS/BLUE SHIELD | Admitting: Internal Medicine

## 2018-11-29 VITALS — BP 130/94 | HR 100 | Ht 65.0 in | Wt 155.5 lb

## 2018-11-29 DIAGNOSIS — I493 Ventricular premature depolarization: Secondary | ICD-10-CM | POA: Diagnosis not present

## 2018-11-29 DIAGNOSIS — I639 Cerebral infarction, unspecified: Secondary | ICD-10-CM

## 2018-11-29 DIAGNOSIS — I10 Essential (primary) hypertension: Secondary | ICD-10-CM

## 2018-11-29 DIAGNOSIS — D649 Anemia, unspecified: Secondary | ICD-10-CM

## 2018-11-29 DIAGNOSIS — I429 Cardiomyopathy, unspecified: Secondary | ICD-10-CM

## 2018-11-29 LAB — COMPREHENSIVE METABOLIC PANEL
ALT: 121 IU/L — ABNORMAL HIGH (ref 0–32)
AST: 47 IU/L — ABNORMAL HIGH (ref 0–40)
Albumin/Globulin Ratio: 1.3 (ref 1.2–2.2)
Albumin: 3.7 g/dL — ABNORMAL LOW (ref 3.8–4.8)
Alkaline Phosphatase: 121 IU/L — ABNORMAL HIGH (ref 39–117)
BUN/Creatinine Ratio: 17 (ref 12–28)
BUN: 14 mg/dL (ref 8–27)
Bilirubin Total: 0.4 mg/dL (ref 0.0–1.2)
CO2: 22 mmol/L (ref 20–29)
Calcium: 8.7 mg/dL (ref 8.7–10.3)
Chloride: 99 mmol/L (ref 96–106)
Creatinine, Ser: 0.82 mg/dL (ref 0.57–1.00)
GFR calc Af Amer: 89 mL/min/{1.73_m2} (ref >59)
GFR calc non Af Amer: 77 mL/min/{1.73_m2} (ref >59)
Globulin, Total: 2.8 g/dL (ref 1.5–4.5)
Glucose: 66 mg/dL (ref 65–99)
Potassium: 4.2 mmol/L (ref 3.5–5.2)
Sodium: 135 mmol/L (ref 134–144)
Total Protein: 6.5 g/dL (ref 6.0–8.5)

## 2018-11-29 LAB — LIPID PANEL W/O CHOL/HDL RATIO
Cholesterol, Total: 153 mg/dL (ref 100–199)
HDL: 33 mg/dL — ABNORMAL LOW (ref >39)
LDL Chol Calc (NIH): 106 mg/dL — ABNORMAL HIGH (ref 0–99)
Triglycerides: 68 mg/dL (ref 0–149)
VLDL Cholesterol Cal: 14 mg/dL (ref 5–40)

## 2018-11-29 LAB — CBC WITH DIFFERENTIAL/PLATELET
Basophils Absolute: 0.1 10*3/uL (ref 0.0–0.2)
Basos: 1 %
EOS (ABSOLUTE): 0.2 10*3/uL (ref 0.0–0.4)
Eos: 2 %
Hematocrit: 29.4 % — ABNORMAL LOW (ref 34.0–46.6)
Hemoglobin: 9.7 g/dL — ABNORMAL LOW (ref 11.1–15.9)
Immature Grans (Abs): 0 10*3/uL (ref 0.0–0.1)
Immature Granulocytes: 0 %
Lymphocytes Absolute: 1.4 10*3/uL (ref 0.7–3.1)
Lymphs: 17 %
MCH: 28.4 pg (ref 26.6–33.0)
MCHC: 33 g/dL (ref 31.5–35.7)
MCV: 86 fL (ref 79–97)
Monocytes Absolute: 1 10*3/uL — ABNORMAL HIGH (ref 0.1–0.9)
Monocytes: 13 %
Neutrophils Absolute: 5.5 10*3/uL (ref 1.4–7.0)
Neutrophils: 67 %
Platelets: 359 10*3/uL (ref 150–450)
RBC: 3.42 x10E6/uL — ABNORMAL LOW (ref 3.77–5.28)
RDW: 14.5 % (ref 11.7–15.4)
WBC: 8.2 10*3/uL (ref 3.4–10.8)

## 2018-11-29 LAB — TSH: TSH: 4.02 u[IU]/mL (ref 0.450–4.500)

## 2018-11-29 NOTE — Patient Instructions (Addendum)
Medication Instructions:  - Your physician recommends that you continue on your current medications as directed. Please refer to the Current Medication list given to you today.  *If you need a refill on your cardiac medications before your next appointment, please call your pharmacy*  Lab Work: - none ordered  If you have labs (blood work) drawn today and your tests are completely normal, you will receive your results only by: Marland Kitchen MyChart Message (if you have MyChart) OR . A paper copy in the mail If you have any lab test that is abnormal or we need to change your treatment, we will call you to review the results.  Testing/Procedures: - none ordered  Follow-Up: At [facility], you and your health needs are our priority.  As part of our continuing mission to provide you with exceptional heart care, we have created designated Provider Care Teams.  These Care Teams include your primary Cardiologist (physician) and Advanced Practice Providers (APPs -  Physician Assistants and Nurse Practitioners) who all work together to provide you with the care you need, when you need it.  Your next appointment:   as needed with Dr. Caryl Comes  The format for your next appointment:   n/a  Provider:   n/a  Other Instructions - Please try to establish with Dr. Mylinda Latina at [facility] for your PVC's

## 2018-11-29 NOTE — Progress Notes (Signed)
Patient Care Team: Dorcas Carrow, DO as PCP - General (Family Medicine)   HPI  Jasmine Camacho is a 63 y.o. female Seen in followup for cardiomyopathy and frequent PVCs seen 10/20 in hospital for SOB and found to also have pneumonia but also elevated BNP and hypertension.   DATE TEST EF   2013 Echo   60-65 %   10/20 Echo   20-25 %         Date Cr K Hgb  3/16    12.9  10/20 0.82 4.2 9.7<<10.2   She carries a diagnosis of allergies to beta-blocker and ACE inhibitors; for cardiomyopathy she was started on Aldactone with hopes that we can get information from her pharmacy related to prior drug exposure  She find out today that she is not eligible for care in the [facility] network as an outpatient.  She is much improved from her dyspnea point of view; no edema no chest pain  Prior hx of stroke with aphasia and brain aneurysm, coiling at [facility] 2013   Records and Results Reviewed   Past Medical History:  Diagnosis Date  . Aphasia   . Hypertension   . Insomnia   . Menopausal symptom   . Menorrhagia   . Pneumonia   . Seasonal allergies   . Tachycardia   . Thyroid disease   . Ventricular premature contractions     Past Surgical History:  Procedure Laterality Date  . aneurysm surgery  09/2003  . DILATION AND CURETTAGE OF UTERUS  2007    Current Meds  Medication Sig  . acetaminophen (TYLENOL 8 HOUR ARTHRITIS PAIN) 650 MG CR tablet Take 650 mg by mouth every 8 (eight) hours as needed (pain or fever).   Marland Kitchen albuterol (VENTOLIN HFA) 108 (90 Base) MCG/ACT inhaler INL 2 PFS PO Q 4 TO 6 H PRN  . Eszopiclone 3 MG TABS Take 1 tablet (3 mg total) by mouth at bedtime. Take immediately before bedtime  . furosemide (LASIX) 20 MG tablet Take 2 tablets (40 mg total) by mouth daily.  . Ipratropium-Albuterol (COMBIVENT RESPIMAT) 20-100 MCG/ACT AERS respimat Inhale 1 puff into the lungs every 6 (six) hours.  . methimazole (TAPAZOLE) 5 MG tablet Take 5 mg by mouth daily.   Marland Kitchen  spironolactone (ALDACTONE) 25 MG tablet Take 1 tablet (25 mg total) by mouth daily.    Allergies  Allergen Reactions  . Ambien [Zolpidem]   . Asa [Aspirin]   . Benazepril Hcl   . Codeine Sulfate Nausea Only  . Metoprolol Tartrate   . Other Other (See Comments)    Valtura  . Hctz [Hydrochlorothiazide] Rash      Review of Systems negative except from HPI and PMH  Physical Exam BP (!) 130/94 (BP Location: Left Arm, Patient Position: Sitting, Cuff Size: Normal)   Pulse 100   Ht 5\' 5"  (1.651 m)   Wt 155 lb 8 oz (70.5 kg)   LMP  (LMP Unknown)   SpO2 99%   BMI 25.88 kg/m  Well developed and well nourished in no acute distress HENT normal E scleral and icterus clear Neck Supple JVP flat; carotids brisk and full Clear to ausculation  Regular rate and rhythm, no murmurs gallops or rub Soft with active bowel sounds No clubbing cyanosis Trace Edema Alert and oriented, grossly normal motor and sensory function Skin Warm and Dry  ECG demonstrates sinus rhythm at 100 Intervals 14/08/39 Axis left at -22 PVCs with a rightward bundle  inferior axis.  In lead V1 the terminal deflection is negative QRS morphology in the inferior leads is upright but fractionated  Assessment and  Plan  Cardiomyopathy question mechanism  \  PVCs RVOT frequent  Hypertension  CVA    Intracerebral coiling 2004  Graves Disease  euthryoid  Allergies listed to metoprolol/benazepril  Anemia  Still with frequent PVCs.  We do not yet have information as to what her "allergies "are.  I have asked the family again to go by the local pharmacy and get a record of the drug exposures to see if this information can be uncovered as it would be important long-term and should be able to take beta blockers and/or ACE/ARB if at all possible.  Given that she is no longer [facility], they were given: Dr. Etta Quill name.  I have asked them however, and I will reach out to him also, that she see Dr. Marcello Moores  because of the likely cause effect relationship of her cardiomyopathy and her PVCs.  She may well benefit from ablative therapy.  Thus far she is tolerating her Aldactone.  Anemia is an ongoing issue.  This needs further elucidation.  Current medicines are reviewed at length with the patient today .  The patient does not  have concerns regarding medicines.

## 2018-11-30 ENCOUNTER — Telehealth: Payer: Self-pay | Admitting: Family Medicine

## 2018-11-30 DIAGNOSIS — E0500 Thyrotoxicosis with diffuse goiter without thyrotoxic crisis or storm: Secondary | ICD-10-CM | POA: Insufficient documentation

## 2018-11-30 DIAGNOSIS — I429 Cardiomyopathy, unspecified: Secondary | ICD-10-CM | POA: Insufficient documentation

## 2018-11-30 LAB — IRON AND TIBC
Iron Saturation: 6 % — CL (ref 15–55)
Iron: 19 ug/dL — ABNORMAL LOW (ref 27–139)
Total Iron Binding Capacity: 329 ug/dL (ref 250–450)
UIBC: 310 ug/dL (ref 118–369)

## 2018-11-30 LAB — SPECIMEN STATUS REPORT

## 2018-11-30 LAB — FERRITIN: Ferritin: 141 ng/mL (ref 15–150)

## 2018-11-30 MED ORDER — FERROUS SULFATE 325 (65 FE) MG PO TBEC: 325.0000 mg | DELAYED_RELEASE_TABLET | Freq: Two times a day (BID) | ORAL | 2 refills | Status: DC

## 2018-11-30 NOTE — Telephone Encounter (Signed)
Called and spoke with patient's husband Lelia Jons (DPR revived) He stated patient was taking a nap. Message relayed to him. He verbalized understanding.

## 2018-11-30 NOTE — Telephone Encounter (Signed)
Please let her know that I've sent her through some medicine to help with her anemia. We'll recheck her levels when she comes in in December.

## 2018-12-03 ENCOUNTER — Encounter: Payer: Self-pay | Admitting: Family Medicine

## 2018-12-03 DIAGNOSIS — I700 Atherosclerosis of aorta: Secondary | ICD-10-CM | POA: Insufficient documentation

## 2018-12-03 DIAGNOSIS — I429 Cardiomyopathy, unspecified: Secondary | ICD-10-CM | POA: Insufficient documentation

## 2018-12-03 DIAGNOSIS — I671 Cerebral aneurysm, nonruptured: Secondary | ICD-10-CM | POA: Insufficient documentation

## 2018-12-03 NOTE — Assessment & Plan Note (Signed)
Continue to follow with endocrine. Call with any concerns. Continue to monitor.

## 2018-12-03 NOTE — Assessment & Plan Note (Signed)
Stable. Not due for refills at this time. Continue to monitor. Call with any concerns.

## 2018-12-03 NOTE — Assessment & Plan Note (Signed)
Known. Has seen neurosurgery at [facility]. Continue to monitor. Call with any concerns.

## 2018-12-03 NOTE — Assessment & Plan Note (Signed)
Will keep BP and cholesterol under good control. Continue to monitor call with any concerns.

## 2018-12-03 NOTE — Assessment & Plan Note (Signed)
To be seeing cardiology this week. Call with any concerns. Continue to monitor. Call with any concerns.

## 2018-12-03 NOTE — Assessment & Plan Note (Signed)
Under good control on current regimen. Continue current regimen. Continue to monitor. Call with any concerns. Refills given. Labs drawn today.   

## 2018-12-14 ENCOUNTER — Telehealth: Payer: Self-pay | Admitting: Family Medicine

## 2018-12-14 NOTE — Telephone Encounter (Signed)
She has a 90 day supply at the pharmacy. They just need to go pick it up.

## 2018-12-14 NOTE — Telephone Encounter (Signed)
Pt's husband states wife was given spironolactone 25 mg and is almost out and wants to know if she needs a refill or needs to keep taking it please advise.Thank you!

## 2018-12-14 NOTE — Telephone Encounter (Signed)
Dr. Wynetta Emery, this is your patient.  Going to forward to you to review.  Thanks.

## 2018-12-14 NOTE — Telephone Encounter (Signed)
Patient's husband notified  

## 2018-12-15 NOTE — Telephone Encounter (Signed)
Pt's spouse called back in, he reached out to the pharmacy and was told that they have not received a Rx for medication. Pt /spouse would like assistance with this.

## 2018-12-22 NOTE — Telephone Encounter (Signed)
Called patient's husband. He states that they picked up the medication last week.

## 2019-01-24 ENCOUNTER — Ambulatory Visit (INDEPENDENT_AMBULATORY_CARE_PROVIDER_SITE_OTHER): Payer: BLUE CROSS/BLUE SHIELD | Admitting: Family Medicine

## 2019-01-24 ENCOUNTER — Ambulatory Visit: Payer: BLUE CROSS/BLUE SHIELD | Admitting: Family Medicine

## 2019-01-24 ENCOUNTER — Other Ambulatory Visit: Payer: Self-pay

## 2019-01-24 ENCOUNTER — Encounter: Payer: Self-pay | Admitting: Family Medicine

## 2019-01-24 VITALS — BP 159/110 | HR 103 | Temp 97.8°F | Ht 62.44 in | Wt 146.5 lb

## 2019-01-24 DIAGNOSIS — Z0000 Encounter for general adult medical examination without abnormal findings: Secondary | ICD-10-CM

## 2019-01-24 DIAGNOSIS — Z1211 Encounter for screening for malignant neoplasm of colon: Secondary | ICD-10-CM | POA: Diagnosis not present

## 2019-01-24 DIAGNOSIS — D509 Iron deficiency anemia, unspecified: Secondary | ICD-10-CM

## 2019-01-24 DIAGNOSIS — I10 Essential (primary) hypertension: Secondary | ICD-10-CM

## 2019-01-24 DIAGNOSIS — Z23 Encounter for immunization: Secondary | ICD-10-CM

## 2019-01-24 DIAGNOSIS — E0590 Thyrotoxicosis, unspecified without thyrotoxic crisis or storm: Secondary | ICD-10-CM | POA: Diagnosis not present

## 2019-01-24 MED ORDER — ANORO ELLIPTA 62.5-25 MCG/INH IN AEPB: 1.0000 | INHALATION_SPRAY | Freq: Every day | RESPIRATORY_TRACT | 3 refills | Status: DC

## 2019-01-24 NOTE — Progress Notes (Signed)
BP (!) 159/110   Pulse (!) 103   Temp 97.8 F (36.6 C)   Ht 5' 2.44" (1.586 m)   Wt 146 lb 8 oz (66.5 kg)   LMP  (LMP Unknown)   SpO2 100%   BMI 26.42 kg/m    Subjective:    Patient ID: Jasmine Camacho, female    DOB: 1956/01/01, 63 y.o.   MRN: [id number]  HPI: LOYD SALVADOR is a 63 y.o. female presenting on 01/24/2019 for comprehensive medical examination. Current medical complaints include:  HYPERTENSION- states that she saw cardiology and was started on imdur and hydralazine, but states that they made her BP go up- BP ran into the 180s/115, had been taking it about 2 weeks. Has not been on it since.  Hypertension status: uncontrolled  Satisfied with current treatment? no Duration of hypertension: chronic BP monitoring frequency:  rarely BP medication side effects:  no Medication compliance: poor compliance Aspirin: no Recurrent headaches: no Visual changes: no Palpitations: no Dyspnea: yes Chest pain: no Lower extremity edema: no Dizzy/lightheaded: no  HYPERTHYROIDISM Thyroid control status:stable Satisfied with current treatment? yes Medication side effects: no Medication compliance: good compliance Recent dose adjustment:no Fatigue: no Cold intolerance: no Heat intolerance: no Weight gain: no Weight loss: no Constipation: no Diarrhea/loose stools: no Palpitations: no Lower extremity edema: no Anxiety/depressed mood: no  She currently lives with: husband Menopausal Symptoms: no  Depression Screen done today and results listed below:  Depression screen [facility] 2/9 01/24/2019 09/14/2018 05/10/2017 11/02/2016 04/16/2015  Decreased Interest 0 0 0 0 0  Down, Depressed, Hopeless 0 0 0 0 0  PHQ - 2 Score 0 0 0 0 0  Altered sleeping - 0 - - -  Tired, decreased energy - 1 - - -  Change in appetite - 0 - - -  Feeling bad or failure about yourself  - 0 - - -  Trouble concentrating - 0 - - -  Moving slowly or fidgety/restless - 0 - - -  Suicidal thoughts - 0 - - -    PHQ-9 Score - 1 - - -  Difficult doing work/chores - Not difficult at all - - -    Past Medical History:  Past Medical History:  Diagnosis Date  . Aphasia   . Hypertension   . Insomnia   . Menopausal symptom   . Menorrhagia   . Pneumonia   . Seasonal allergies   . Tachycardia   . Thyroid disease   . Ventricular premature contractions     Surgical History:  Past Surgical History:  Procedure Laterality Date  . aneurysm surgery  09/2003  . DILATION AND CURETTAGE OF UTERUS  2007    Medications:  Current Outpatient Medications on File Prior to Visit  Medication Sig  . acetaminophen (TYLENOL 8 HOUR ARTHRITIS PAIN) 650 MG CR tablet Take 650 mg by mouth every 8 (eight) hours as needed (pain or fever).   Marland Kitchen albuterol (VENTOLIN HFA) 108 (90 Base) MCG/ACT inhaler INL 2 PFS PO Q 4 TO 6 H PRN  . Eszopiclone 3 MG TABS Take 1 tablet (3 mg total) by mouth at bedtime. Take immediately before bedtime  . ferrous sulfate 325 (65 FE) MG EC tablet Take 1 tablet (325 mg total) by mouth 2 (two) times daily.  . furosemide (LASIX) 20 MG tablet Take 2 tablets (40 mg total) by mouth daily.  . Ipratropium-Albuterol (COMBIVENT RESPIMAT) 20-100 MCG/ACT AERS respimat Inhale 1 puff into the lungs every 6 (six) hours.  Marland Kitchen  isosorbide mononitrate (IMDUR) 30 MG 24 hr tablet Take 1 tablet (30 mg total) by mouth daily.  . methimazole (TAPAZOLE) 5 MG tablet Take 5 mg by mouth daily.   . nicotine (NICODERM CQ) 21 mg/24hr patch Place 1 patch (21 mg total) onto the skin daily. (Patient not taking: Reported on 11/29/2018)  . spironolactone (ALDACTONE) 25 MG tablet Take 1 tablet (25 mg total) by mouth daily.   No current facility-administered medications on file prior to visit.    Allergies:  Allergies  Allergen Reactions  . Ambien [Zolpidem]   . Asa [Aspirin]   . Benazepril Hcl   . Codeine Sulfate Nausea Only  . Metoprolol Tartrate   . Other Other (See Comments)    Valtura  . Hctz [Hydrochlorothiazide] Rash     Social History:  Social History   Socioeconomic History  . Marital status: Married    Spouse name: Not on file  . Number of children: Not on file  . Years of education: Not on file  . Highest education level: Not on file  Occupational History  . Not on file  Tobacco Use  . Smoking status: Current Every Day Smoker    Packs/day: 0.50    Types: Cigarettes  . Smokeless tobacco: Never Used  Substance and Sexual Activity  . Alcohol use: No    Alcohol/week: 0.0 standard drinks  . Drug use: No  . Sexual activity: Never  Other Topics Concern  . Not on file  Social History Narrative  . Not on file   Social Determinants of Health   Financial Resource Strain:   . Difficulty of Paying Living Expenses: Not on file  Food Insecurity:   . Worried About Charity fundraiser in the Last Year: Not on file  . Ran Out of Food in the Last Year: Not on file  Transportation Needs:   . Lack of Transportation (Medical): Not on file  . Lack of Transportation (Non-Medical): Not on file  Physical Activity:   . Days of Exercise per Week: Not on file  . Minutes of Exercise per Session: Not on file  Stress:   . Feeling of Stress : Not on file  Social Connections:   . Frequency of Communication with Friends and Family: Not on file  . Frequency of Social Gatherings with Friends and Family: Not on file  . Attends Religious Services: Not on file  . Active Member of Clubs or Organizations: Not on file  . Attends Archivist Meetings: Not on file  . Marital Status: Not on file  Intimate Partner Violence:   . Fear of Current or Ex-Partner: Not on file  . Emotionally Abused: Not on file  . Physically Abused: Not on file  . Sexually Abused: Not on file   Social History   Tobacco Use  Smoking Status Current Every Day Smoker  . Packs/day: 0.50  . Types: Cigarettes  Smokeless Tobacco Never Used   Social History   Substance and Sexual Activity  Alcohol Use No  . Alcohol/week: 0.0  standard drinks    Family History:  Family History  Problem Relation Age of Onset  . Stroke Mother   . Diabetes Mother   . Heart disease Mother   . Hypertension Mother   . Alcohol abuse Father   . Cancer Father        liver  . Diabetes Brother   . Hypertension Brother   . Heart disease Brother   . Cancer Brother  colon    Past medical history, surgical history, medications, allergies, family history and social history reviewed with patient today and changes made to appropriate areas of the chart.   Review of Systems  Constitutional: Positive for weight loss. Negative for chills, diaphoresis, fever and malaise/fatigue.  HENT: Positive for congestion and nosebleeds. Negative for ear discharge, ear pain, hearing loss, sinus pain, sore throat and tinnitus.   Eyes: Negative.   Respiratory: Positive for cough, shortness of breath and wheezing. Negative for hemoptysis, sputum production and stridor.   Cardiovascular: Positive for orthopnea. Negative for chest pain, palpitations, claudication, leg swelling and PND.  Gastrointestinal: Positive for melena (on iron so unclear if blood or not). Negative for abdominal pain, blood in stool, constipation, diarrhea, heartburn, nausea and vomiting.  Genitourinary: Negative.   Musculoskeletal: Negative.   Skin: Negative.   Neurological: Negative.   Endo/Heme/Allergies: Positive for environmental allergies.  Psychiatric/Behavioral: Negative.     All other ROS negative except what is listed above and in the HPI.      Objective:    BP (!) 159/110   Pulse (!) 103   Temp 97.8 F (36.6 C)   Ht 5' 2.44" (1.586 m)   Wt 146 lb 8 oz (66.5 kg)   LMP  (LMP Unknown)   SpO2 100%   BMI 26.42 kg/m   Wt Readings from Last 3 Encounters:  01/24/19 146 lb 8 oz (66.5 kg)  11/29/18 155 lb 8 oz (70.5 kg)  11/28/18 156 lb (70.8 kg)    Physical Exam Vitals and nursing note reviewed.  Constitutional:      General: She is not in acute  distress.    Appearance: Normal appearance. She is not ill-appearing, toxic-appearing or diaphoretic.  HENT:     Head: Normocephalic and atraumatic.     Right Ear: Tympanic membrane, ear canal and external ear normal. There is no impacted cerumen.     Left Ear: Tympanic membrane, ear canal and external ear normal. There is no impacted cerumen.     Nose: Nose normal. No congestion or rhinorrhea.     Mouth/Throat:     Mouth: Mucous membranes are moist.     Pharynx: Oropharynx is clear. No oropharyngeal exudate or posterior oropharyngeal erythema.  Eyes:     General: No scleral icterus.       Right eye: No discharge.        Left eye: No discharge.     Extraocular Movements: Extraocular movements intact.     Conjunctiva/sclera: Conjunctivae normal.     Pupils: Pupils are equal, round, and reactive to light.  Neck:     Vascular: No carotid bruit.  Cardiovascular:     Rate and Rhythm: Normal rate and regular rhythm.     Pulses: Normal pulses.     Heart sounds: No murmur. No friction rub. No gallop.   Pulmonary:     Effort: Pulmonary effort is normal. No respiratory distress.     Breath sounds: Normal breath sounds. No stridor. No wheezing, rhonchi or rales.  Chest:     Chest wall: No tenderness.  Abdominal:     General: Abdomen is flat. Bowel sounds are normal. There is no distension.     Palpations: Abdomen is soft. There is no mass.     Tenderness: There is no abdominal tenderness. There is no right CVA tenderness, left CVA tenderness, guarding or rebound.     Hernia: No hernia is present.  Genitourinary:    Comments: Breast and pelvic  exams deferred with shared decision making Musculoskeletal:        General: No swelling, tenderness, deformity or signs of injury.     Cervical back: Normal range of motion and neck supple. No rigidity. No muscular tenderness.     Right lower leg: No edema.     Left lower leg: No edema.  Lymphadenopathy:     Cervical: No cervical adenopathy.   Skin:    General: Skin is warm and dry.     Capillary Refill: Capillary refill takes less than 2 seconds.     Coloration: Skin is not jaundiced or pale.     Findings: No bruising, erythema, lesion or rash.  Neurological:     General: No focal deficit present.     Mental Status: She is alert and oriented to person, place, and time. Mental status is at baseline.     Cranial Nerves: No cranial nerve deficit.     Sensory: No sensory deficit.     Motor: No weakness.     Coordination: Coordination normal.     Gait: Gait normal.     Deep Tendon Reflexes: Reflexes normal.  Psychiatric:        Mood and Affect: Mood normal.        Behavior: Behavior normal.        Thought Content: Thought content normal.        Judgment: Judgment normal.     Results for orders placed or performed in visit on 01/24/19  CBC with Differential OUT  Result Value Ref Range   WBC 7.7 3.4 - 10.8 x10E3/uL   RBC 3.90 3.77 - 5.28 x10E6/uL   Hemoglobin 11.0 (L) 11.1 - 15.9 g/dL   Hematocrit 33.8 (L) 34.0 - 46.6 %   MCV 87 79 - 97 fL   MCH 28.2 26.6 - 33.0 pg   MCHC 32.5 31.5 - 35.7 g/dL   RDW 16.4 (H) 11.7 - 15.4 %   Platelets 348 150 - 450 x10E3/uL   Neutrophils 67 Not Estab. %   Lymphs 20 Not Estab. %   Monocytes 9 Not Estab. %   Eos 3 Not Estab. %   Basos 1 Not Estab. %   Neutrophils Absolute 5.2 1.4 - 7.0 x10E3/uL   Lymphocytes Absolute 1.5 0.7 - 3.1 x10E3/uL   Monocytes Absolute 0.7 0.1 - 0.9 x10E3/uL   EOS (ABSOLUTE) 0.3 0.0 - 0.4 x10E3/uL   Basophils Absolute 0.1 0.0 - 0.2 x10E3/uL   Immature Granulocytes 0 Not Estab. %   Immature Grans (Abs) 0.0 0.0 - 0.1 x10E3/uL  Comp Met (CMET)  Result Value Ref Range   Glucose 99 65 - 99 mg/dL   BUN 18 8 - 27 mg/dL   Creatinine, Ser 0.89 0.57 - 1.00 mg/dL   GFR calc non Af Amer 70 >59 mL/min/1.73   GFR calc Af Amer 80 >59 mL/min/1.73   BUN/Creatinine Ratio 20 12 - 28   Sodium 138 134 - 144 mmol/L   Potassium 3.8 3.5 - 5.2 mmol/L   Chloride 99 96 - 106  mmol/L   CO2 23 20 - 29 mmol/L   Calcium 9.6 8.7 - 10.3 mg/dL   Total Protein 6.9 6.0 - 8.5 g/dL   Albumin 4.2 3.8 - 4.8 g/dL   Globulin, Total 2.7 1.5 - 4.5 g/dL   Albumin/Globulin Ratio 1.6 1.2 - 2.2   Bilirubin Total 0.4 0.0 - 1.2 mg/dL   Alkaline Phosphatase 138 (H) 39 - 117 IU/L   AST 19 0 -  40 IU/L   ALT 25 0 - 32 IU/L  TSH  Result Value Ref Range   TSH 0.654 0.450 - 4.500 uIU/mL      Assessment & Plan:   Problem List Items Addressed This Visit      Cardiovascular and Mediastinum   Hypertension    Not under good control. Will restart imdur and recheck 2 weeks. Call with any concerns.       Relevant Medications   isosorbide mononitrate (IMDUR) 30 MG 24 hr tablet   Other Relevant Orders   Comp Met (CMET) (Completed)     Endocrine   Hyperthyroidism    Continue to follow with endocrinology. Continue to monitor. Call with any concerns.       Relevant Orders   TSH (Completed)    Other Visit Diagnoses    Routine general medical examination at a health care facility    -  Primary   Vaccines up to date. Screening labs checked today. Mammogram and pap declined. Cologuard ordered today. Call with any concerns. Conitnue to monitor.    Iron deficiency anemia, unspecified iron deficiency anemia type       Checking labs today. Concern for rectal bleeding- will check cologuard. Await results.    Relevant Orders   CBC with Differential OUT (Completed)   Screening for colon cancer       Flat out refuses colonoscopy. Weight loss and changes in stool. Will do cologuard. Order placed today. Await results.   Relevant Orders   Cologuard   Immunization due       Flu shot given today.   Relevant Orders   Flu Vaccine QUAD 6+ mos PF IM (Fluarix Quad PF) (Completed)       Follow up plan: Return in about 2 weeks (around 02/07/2019).   LABORATORY TESTING:  - Pap smear: Refused  IMMUNIZATIONS:   - Tdap: Tetanus vaccination status reviewed: last tetanus booster within 10  years. - Influenza: Up to date - Pneumovax: Not applicable - Prevnar: Not applicable  SCREENING: -Mammogram: Refused  - Colonoscopy: Will do cologuard- ordered today  - Bone Density: Not applicable   PATIENT COUNSELING:   Advised to take 1 mg of folate supplement per day if capable of pregnancy.   Sexuality: Discussed sexually transmitted diseases, partner selection, use of condoms, avoidance of unintended pregnancy  and contraceptive alternatives.   Advised to avoid cigarette smoking.  I discussed with the patient that most people either abstain from alcohol or drink within safe limits (<=14/week and <=4 drinks/occasion for males, <=7/weeks and <= 3 drinks/occasion for females) and that the risk for alcohol disorders and other health effects rises proportionally with the number of drinks per week and how often a drinker exceeds daily limits.  Discussed cessation/primary prevention of drug use and availability of treatment for abuse.   Diet: Encouraged to adjust caloric intake to maintain  or achieve ideal body weight, to reduce intake of dietary saturated fat and total fat, to limit sodium intake by avoiding high sodium foods and not adding table salt, and to maintain adequate dietary potassium and calcium preferably from fresh fruits, vegetables, and low-fat dairy products.    stressed the importance of regular exercise  Injury prevention: Discussed safety belts, safety helmets, smoke detector, smoking near bedding or upholstery.   Dental health: Discussed importance of regular tooth brushing, flossing, and dental visits.    NEXT PREVENTATIVE PHYSICAL DUE IN 1 YEAR. Return in about 2 weeks (around 02/07/2019).

## 2019-01-25 LAB — CBC WITH DIFFERENTIAL/PLATELET
Basophils Absolute: 0.1 10*3/uL (ref 0.0–0.2)
Basos: 1 %
EOS (ABSOLUTE): 0.3 10*3/uL (ref 0.0–0.4)
Eos: 3 %
Hematocrit: 33.8 % — ABNORMAL LOW (ref 34.0–46.6)
Hemoglobin: 11 g/dL — ABNORMAL LOW (ref 11.1–15.9)
Immature Grans (Abs): 0 10*3/uL (ref 0.0–0.1)
Immature Granulocytes: 0 %
Lymphocytes Absolute: 1.5 10*3/uL (ref 0.7–3.1)
Lymphs: 20 %
MCH: 28.2 pg (ref 26.6–33.0)
MCHC: 32.5 g/dL (ref 31.5–35.7)
MCV: 87 fL (ref 79–97)
Monocytes Absolute: 0.7 10*3/uL (ref 0.1–0.9)
Monocytes: 9 %
Neutrophils Absolute: 5.2 10*3/uL (ref 1.4–7.0)
Neutrophils: 67 %
Platelets: 348 10*3/uL (ref 150–450)
RBC: 3.9 x10E6/uL (ref 3.77–5.28)
RDW: 16.4 % — ABNORMAL HIGH (ref 11.7–15.4)
WBC: 7.7 10*3/uL (ref 3.4–10.8)

## 2019-01-25 LAB — COMPREHENSIVE METABOLIC PANEL
ALT: 25 IU/L (ref 0–32)
AST: 19 IU/L (ref 0–40)
Albumin/Globulin Ratio: 1.6 (ref 1.2–2.2)
Albumin: 4.2 g/dL (ref 3.8–4.8)
Alkaline Phosphatase: 138 IU/L — ABNORMAL HIGH (ref 39–117)
BUN/Creatinine Ratio: 20 (ref 12–28)
BUN: 18 mg/dL (ref 8–27)
Bilirubin Total: 0.4 mg/dL (ref 0.0–1.2)
CO2: 23 mmol/L (ref 20–29)
Calcium: 9.6 mg/dL (ref 8.7–10.3)
Chloride: 99 mmol/L (ref 96–106)
Creatinine, Ser: 0.89 mg/dL (ref 0.57–1.00)
GFR calc Af Amer: 80 mL/min/{1.73_m2} (ref >59)
GFR calc non Af Amer: 70 mL/min/{1.73_m2} (ref >59)
Globulin, Total: 2.7 g/dL (ref 1.5–4.5)
Glucose: 99 mg/dL (ref 65–99)
Potassium: 3.8 mmol/L (ref 3.5–5.2)
Sodium: 138 mmol/L (ref 134–144)
Total Protein: 6.9 g/dL (ref 6.0–8.5)

## 2019-01-25 LAB — TSH: TSH: 0.654 u[IU]/mL (ref 0.450–4.500)

## 2019-01-29 ENCOUNTER — Encounter: Payer: Self-pay | Admitting: Family Medicine

## 2019-01-29 NOTE — Assessment & Plan Note (Signed)
Not under good control. Will restart imdur and recheck 2 weeks. Call with any concerns.

## 2019-01-29 NOTE — Assessment & Plan Note (Signed)
Continue to follow with endocrinology. Continue to monitor. Call with any concerns.  

## 2019-02-05 ENCOUNTER — Other Ambulatory Visit: Payer: Self-pay | Admitting: Family Medicine

## 2019-02-06 ENCOUNTER — Other Ambulatory Visit: Payer: Self-pay | Admitting: Family Medicine

## 2019-02-06 NOTE — Telephone Encounter (Signed)
Not due until 1/14. Refusing.

## 2019-02-06 NOTE — Telephone Encounter (Signed)
   Notes to clinic:  2nd request but I am unable refuse because it a controlled    Requested Prescriptions  Pending Prescriptions Disp Refills   Eszopiclone 3 MG TABS [Pharmacy Med Name: ESZOPICLONE 3MG  TABLETS] 90 tablet     Sig: TAKE 1 TABLET BY MOUTH IMMEDIATELY BEFORE BEDTIME      Not Delegated - Psychiatry:  Anxiolytics/Hypnotics Failed - 02/06/2019  9:04 AM      Failed - This refill cannot be delegated      Failed - Urine Drug Screen completed in last 360 days.      Passed - Valid encounter within last 6 months    Recent Outpatient Visits           1 week ago Routine general medical examination at a health care facility   [facility], [place] P, DO   2 months ago Atypical pneumonia   [place], Megan P, DO   4 months ago Essential hypertension   [place], Megan P, DO   6 months ago Pneumonia due to infectious organism, unspecified laterality, unspecified part of lung   [facility] [place], Megan P, DO   7 months ago Pneumonia due to infectious organism, unspecified laterality, unspecified part of lung   [employer], [place], DO       Future Appointments             In 1 week Wynetta Emery, Barb Merino, DO [employer], [facility]

## 2019-02-06 NOTE — Telephone Encounter (Signed)
Edd Arbour stated that they have met the deductible for the year. He stated that she only had 4 pills left.

## 2019-02-06 NOTE — Telephone Encounter (Signed)
Pt would like this sent to [facility].

## 2019-02-06 NOTE — Telephone Encounter (Signed)
Requested medication (s) are due for refill today: yes  Requested medication (s) are on the active medication list: yes  Last refill:  09/11/2018  Future visit scheduled: yes  Notes to clinic:  This refill cannot be delegated    Requested Prescriptions  Pending Prescriptions Disp Refills   Eszopiclone 3 MG TABS [Pharmacy Med Name: ESZOPICLONE 3MG  TABLETS] 90 tablet     Sig: TAKE 1 TABLET BY MOUTH IMMEDIATELY BEFORE BEDTIME      Not Delegated - Psychiatry:  Anxiolytics/Hypnotics Failed - 02/05/2019 11:03 AM      Failed - This refill cannot be delegated      Failed - Urine Drug Screen completed in last 360 days.      Passed - Valid encounter within last 6 months    Recent Outpatient Visits           1 week ago Routine general medical examination at a health care facility   [facility], [place] P, DO   2 months ago Atypical pneumonia   [place], Megan P, DO   4 months ago Essential hypertension   [place], Megan P, DO   6 months ago Pneumonia due to infectious organism, unspecified laterality, unspecified part of lung   [facility] [place], Megan P, DO   7 months ago Pneumonia due to infectious organism, unspecified laterality, unspecified part of lung   [employer], [place], DO       Future Appointments             In 1 week Wynetta Emery, Barb Merino, DO [employer], [facility]

## 2019-02-07 NOTE — Telephone Encounter (Signed)
Patient's husband notified  

## 2019-02-07 NOTE — Telephone Encounter (Signed)
She is not due until 1/14.

## 2019-02-13 NOTE — Progress Notes (Signed)
BP 128/89   Pulse 98   Temp 98.4 F (36.9 C)   Wt 147 lb (66.7 kg)   LMP  (LMP Unknown)   SpO2 99%   BMI 26.51 kg/m    Subjective:    Patient ID: Jasmine Camacho, female    DOB: 16-Jul-1955, 64 y.o.   MRN: [id number]  HPI: Jasmine Camacho is a 64 y.o. female  Chief Complaint  Patient presents with  . Hypertension   HYPERTENSION Hypertension status: better  Satisfied with current treatment? yes Duration of hypertension: chronic BP monitoring frequency:  not checking BP medication side effects:  no Medication compliance: excellent compliance Previous BP meds: spironalactone, imdur, lasix Aspirin: no Recurrent headaches: no Visual changes: no Palpitations: no Dyspnea: no Chest pain: no Lower extremity edema: no Dizzy/lightheaded: no  INSOMNIA Duration: chronic Satisfied with sleep quality: yes Difficulty falling asleep: no Difficulty staying asleep: no Waking a few hours after sleep onset: no Early morning awakenings: no Daytime hypersomnolence: no Wakes feeling refreshed: yes Good sleep hygiene: yes Apnea: no Snoring: no Depressed/anxious mood: no Recent stress: no Restless legs/nocturnal leg cramps: no Chronic pain/arthritis: no Treatments attempted: lunesta, melatonin, uinsom, benadryl and ambien   Relevant past medical, surgical, family and social history reviewed and updated as indicated. Interim medical history since our last visit reviewed. Allergies and medications reviewed and updated.  Review of Systems  Constitutional: Negative.   HENT: Negative.   Respiratory: Negative.   Cardiovascular: Negative.   Gastrointestinal: Negative.   Psychiatric/Behavioral: Negative.     Per HPI unless specifically indicated above     Objective:    BP 128/89   Pulse 98   Temp 98.4 F (36.9 C)   Wt 147 lb (66.7 kg)   LMP  (LMP Unknown)   SpO2 99%   BMI 26.51 kg/m   Wt Readings from Last 3 Encounters:  02/14/19 147 lb (66.7 kg)  01/24/19 146 lb 8 oz  (66.5 kg)  11/29/18 155 lb 8 oz (70.5 kg)    Physical Exam Vitals and nursing note reviewed.  Constitutional:      General: She is not in acute distress.    Appearance: Normal appearance. She is not ill-appearing, toxic-appearing or diaphoretic.  HENT:     Head: Normocephalic and atraumatic.     Right Ear: External ear normal.     Left Ear: External ear normal.     Nose: Nose normal.     Mouth/Throat:     Mouth: Mucous membranes are moist.     Pharynx: Oropharynx is clear.  Eyes:     General: No scleral icterus.       Right eye: No discharge.        Left eye: No discharge.     Extraocular Movements: Extraocular movements intact.     Conjunctiva/sclera: Conjunctivae normal.     Pupils: Pupils are equal, round, and reactive to light.  Cardiovascular:     Rate and Rhythm: Normal rate and regular rhythm.     Pulses: Normal pulses.     Heart sounds: Normal heart sounds. No murmur. No friction rub. No gallop.   Pulmonary:     Effort: Pulmonary effort is normal. No respiratory distress.     Breath sounds: Normal breath sounds. No stridor. No wheezing, rhonchi or rales.  Chest:     Chest wall: No tenderness.  Musculoskeletal:        General: Normal range of motion.     Cervical back: Normal range of  motion and neck supple.  Skin:    General: Skin is warm and dry.     Capillary Refill: Capillary refill takes less than 2 seconds.     Coloration: Skin is not jaundiced or pale.     Findings: No bruising, erythema, lesion or rash.  Neurological:     General: No focal deficit present.     Mental Status: She is alert and oriented to person, place, and time. Mental status is at baseline.  Psychiatric:        Mood and Affect: Mood normal.        Behavior: Behavior normal.        Thought Content: Thought content normal.        Judgment: Judgment normal.     Results for orders placed or performed in visit on 01/24/19  CBC with Differential OUT  Result Value Ref Range   WBC 7.7 3.4  - 10.8 x10E3/uL   RBC 3.90 3.77 - 5.28 x10E6/uL   Hemoglobin 11.0 (L) 11.1 - 15.9 g/dL   Hematocrit 33.8 (L) 34.0 - 46.6 %   MCV 87 79 - 97 fL   MCH 28.2 26.6 - 33.0 pg   MCHC 32.5 31.5 - 35.7 g/dL   RDW 16.4 (H) 11.7 - 15.4 %   Platelets 348 150 - 450 x10E3/uL   Neutrophils 67 Not Estab. %   Lymphs 20 Not Estab. %   Monocytes 9 Not Estab. %   Eos 3 Not Estab. %   Basos 1 Not Estab. %   Neutrophils Absolute 5.2 1.4 - 7.0 x10E3/uL   Lymphocytes Absolute 1.5 0.7 - 3.1 x10E3/uL   Monocytes Absolute 0.7 0.1 - 0.9 x10E3/uL   EOS (ABSOLUTE) 0.3 0.0 - 0.4 x10E3/uL   Basophils Absolute 0.1 0.0 - 0.2 x10E3/uL   Immature Granulocytes 0 Not Estab. %   Immature Grans (Abs) 0.0 0.0 - 0.1 x10E3/uL  Comp Met (CMET)  Result Value Ref Range   Glucose 99 65 - 99 mg/dL   BUN 18 8 - 27 mg/dL   Creatinine, Ser 0.89 0.57 - 1.00 mg/dL   GFR calc non Af Amer 70 >59 mL/min/1.73   GFR calc Af Amer 80 >59 mL/min/1.73   BUN/Creatinine Ratio 20 12 - 28   Sodium 138 134 - 144 mmol/L   Potassium 3.8 3.5 - 5.2 mmol/L   Chloride 99 96 - 106 mmol/L   CO2 23 20 - 29 mmol/L   Calcium 9.6 8.7 - 10.3 mg/dL   Total Protein 6.9 6.0 - 8.5 g/dL   Albumin 4.2 3.8 - 4.8 g/dL   Globulin, Total 2.7 1.5 - 4.5 g/dL   Albumin/Globulin Ratio 1.6 1.2 - 2.2   Bilirubin Total 0.4 0.0 - 1.2 mg/dL   Alkaline Phosphatase 138 (H) 39 - 117 IU/L   AST 19 0 - 40 IU/L   ALT 25 0 - 32 IU/L  TSH  Result Value Ref Range   TSH 0.654 0.450 - 4.500 uIU/mL      Assessment & Plan:   Problem List Items Addressed This Visit      Cardiovascular and Mediastinum   Hypertension - Primary    Under good control on current regimen. Continue current regimen. Continue to monitor. Call with any concerns. Refills given today.      Relevant Orders   Basic Metabolic Panel (BMET)     Respiratory   Chronic obstructive pulmonary disease ([place])    Doing great on anoro. Lungs clear. Continue medication. Continue to  monitor.         Other    Insomnia    Notes that she is out of her medication. [facility]. Last Rx sent in October for 90 pills, 1 month early. Total of 180 pills given since 09/11/18- not due until 03/14/19. We will change to local pharmacy and give monthly fills to try to keep pill count correct. Rx sent to be filled on 03/14/19 with 1 refill. Follow up 3 months.       Iron deficiency anemia    Rechecking labs today. Await results. Treat as needed.       Relevant Orders   CBC with Differential OUT    Other Visit Diagnoses    Anemia, unspecified type           Follow up plan: Return in about 3 months (around 05/15/2019).

## 2019-02-14 ENCOUNTER — Encounter: Payer: Self-pay | Admitting: Family Medicine

## 2019-02-14 ENCOUNTER — Other Ambulatory Visit: Payer: Self-pay

## 2019-02-14 ENCOUNTER — Ambulatory Visit (INDEPENDENT_AMBULATORY_CARE_PROVIDER_SITE_OTHER): Payer: Managed Care, Other (non HMO) | Admitting: Family Medicine

## 2019-02-14 VITALS — BP 128/89 | HR 98 | Temp 98.4°F | Wt 147.0 lb

## 2019-02-14 DIAGNOSIS — D509 Iron deficiency anemia, unspecified: Secondary | ICD-10-CM

## 2019-02-14 DIAGNOSIS — D649 Anemia, unspecified: Secondary | ICD-10-CM | POA: Diagnosis not present

## 2019-02-14 DIAGNOSIS — I10 Essential (primary) hypertension: Secondary | ICD-10-CM

## 2019-02-14 DIAGNOSIS — F5101 Primary insomnia: Secondary | ICD-10-CM | POA: Diagnosis not present

## 2019-02-14 DIAGNOSIS — J449 Chronic obstructive pulmonary disease, unspecified: Secondary | ICD-10-CM

## 2019-02-14 MED ORDER — ESZOPICLONE 3 MG PO TABS: 3.0000 mg | ORAL_TABLET | Freq: Every day | ORAL | 1 refills | Status: DC

## 2019-02-14 NOTE — Assessment & Plan Note (Signed)
Under good control on current regimen. Continue current regimen. Continue to monitor. Call with any concerns. Refills given today.   

## 2019-02-14 NOTE — Assessment & Plan Note (Signed)
Notes that she is out of her medication. Called [facility]. Last Rx sent in October for 90 pills, 1 month early. Total of 180 pills given since 09/11/18- not due until 03/14/19. We will change to local pharmacy and give monthly fills to try to keep pill count correct. Rx sent to be filled on 03/14/19 with 1 refill. Follow up 3 months.

## 2019-02-14 NOTE — Assessment & Plan Note (Signed)
Doing great on anoro. Lungs clear. Continue medication. Continue to monitor.

## 2019-02-14 NOTE — Assessment & Plan Note (Signed)
Rechecking labs today. Await results. Treat as needed.  °

## 2019-02-15 LAB — BASIC METABOLIC PANEL
BUN/Creatinine Ratio: 16 (ref 12–28)
BUN: 13 mg/dL (ref 8–27)
CO2: 24 mmol/L (ref 20–29)
Calcium: 10 mg/dL (ref 8.7–10.3)
Chloride: 100 mmol/L (ref 96–106)
Creatinine, Ser: 0.82 mg/dL (ref 0.57–1.00)
GFR calc Af Amer: 89 mL/min/{1.73_m2} (ref >59)
GFR calc non Af Amer: 77 mL/min/{1.73_m2} (ref >59)
Glucose: 87 mg/dL (ref 65–99)
Potassium: 3.6 mmol/L (ref 3.5–5.2)
Sodium: 140 mmol/L (ref 134–144)

## 2019-02-15 LAB — CBC WITH DIFFERENTIAL/PLATELET
Basophils Absolute: 0.1 10*3/uL (ref 0.0–0.2)
Basos: 1 %
EOS (ABSOLUTE): 0.4 10*3/uL (ref 0.0–0.4)
Eos: 5 %
Hematocrit: 35.5 % (ref 34.0–46.6)
Hemoglobin: 11.2 g/dL (ref 11.1–15.9)
Immature Grans (Abs): 0 10*3/uL (ref 0.0–0.1)
Immature Granulocytes: 0 %
Lymphocytes Absolute: 1.4 10*3/uL (ref 0.7–3.1)
Lymphs: 21 %
MCH: 27.5 pg (ref 26.6–33.0)
MCHC: 31.5 g/dL (ref 31.5–35.7)
MCV: 87 fL (ref 79–97)
Monocytes Absolute: 0.6 10*3/uL (ref 0.1–0.9)
Monocytes: 9 %
Neutrophils Absolute: 4.5 10*3/uL (ref 1.4–7.0)
Neutrophils: 64 %
Platelets: 313 10*3/uL (ref 150–450)
RBC: 4.07 x10E6/uL (ref 3.77–5.28)
RDW: 16.3 % — ABNORMAL HIGH (ref 11.7–15.4)
WBC: 7 10*3/uL (ref 3.4–10.8)

## 2019-02-15 LAB — IRON AND TIBC
Iron Saturation: 13 % — ABNORMAL LOW (ref 15–55)
Iron: 49 ug/dL (ref 27–139)
Total Iron Binding Capacity: 377 ug/dL (ref 250–450)
UIBC: 328 ug/dL (ref 118–369)

## 2019-02-15 LAB — FERRITIN: Ferritin: 89 ng/mL (ref 15–150)

## 2019-03-08 ENCOUNTER — Telehealth: Payer: Self-pay | Admitting: Family Medicine

## 2019-03-08 NOTE — Telephone Encounter (Signed)
She has been losing them. I will only give her 30 days at a time.

## 2019-03-08 NOTE — Telephone Encounter (Addendum)
Pt states she always gets a 90 day Rx for   Eszopiclone 3 MG TABS But the last Rx was for only 30. Pt would like this changed, as her Rx is due next week 03/15/19  [id number] [place], [place] - [address] AT [facility] [address] [facility] Phone:  [phone]  Fax:  [phone]

## 2019-03-08 NOTE — Telephone Encounter (Signed)
Noted. I discussed this with her and her husband at her last visit. I will continue to give her 30 days

## 2019-03-08 NOTE — Telephone Encounter (Signed)
Called and spoke to patient. She states that this is not right, she needs 90 days, and that she needs her medication. I apologized and let the patient know that we were not stopping her medication or anything, just only giving her 30 days at a time versus 90. Patient was very agitated and wants her RX changed to 90 days. I explained that Dr. Laural Benes was only willing to give her 30 at a time. Patient states she would like this sent to Dr. Laural Benes anyway.

## 2019-03-08 NOTE — Telephone Encounter (Signed)
Routing to provider to advise.  

## 2019-03-25 ENCOUNTER — Ambulatory Visit: Payer: Managed Care, Other (non HMO) | Attending: Internal Medicine

## 2019-03-25 DIAGNOSIS — Z23 Encounter for immunization: Secondary | ICD-10-CM | POA: Insufficient documentation

## 2019-03-25 NOTE — Progress Notes (Signed)
   Covid-19 Vaccination Clinic  Name:  Jasmine Camacho    MRN: [id number] DOB: [birth year]  03/25/2019  [name] was observed post Covid-19 immunization for 15 minutes without incidence. She was provided with Vaccine Information Sheet and instruction to access the V-Safe system.   [name] was instructed to call 911 with any severe reactions post vaccine: Marland Kitchen Difficulty breathing  . Swelling of your face and throat  . A fast heartbeat  . A bad rash all over your body  . Dizziness and weakness    Immunizations Administered    Name Date Dose VIS Date Route   Pfizer COVID-19 Vaccine 03/25/2019 12:19 PM 0.3 mL 01/20/2019 Intramuscular   Manufacturer: [employer], [employer]   Lot: [id number]   NDC: [address]-9

## 2019-04-11 ENCOUNTER — Telehealth: Payer: Self-pay | Admitting: Family Medicine

## 2019-04-11 NOTE — Telephone Encounter (Signed)
Prior Authorization initiated via CoverMyMeds for Eszopiclone 3MG  tablets Key: 

## 2019-04-11 NOTE — Telephone Encounter (Signed)
Pt's insurance changed to ambetter and since it changed her eszopiclone 3 mg now have to be pior authorized, (was told by the pharmacy).

## 2019-04-14 ENCOUNTER — Telehealth: Payer: Self-pay | Admitting: Family Medicine

## 2019-04-14 MED ORDER — ESZOPICLONE 3 MG PO TABS: 3.0000 mg | ORAL_TABLET | Freq: Every day | ORAL | 0 refills | Status: DC

## 2019-04-14 NOTE — Telephone Encounter (Signed)
Patient is willing to pay cash, please print hard script, will go to [facility].

## 2019-04-14 NOTE — Telephone Encounter (Signed)
Routing to PCP

## 2019-04-14 NOTE — Telephone Encounter (Signed)
Pt states insurance company denied Pts Eszopiclone due to not having a clinical reason for her taking this medication.

## 2019-04-14 NOTE — Telephone Encounter (Signed)
I will send in Sonata for her to try- can we please confirm when she last filled her eszopiclone and cancel any other Rxs she has on file for it?  Please let husband know what's going on.

## 2019-04-14 NOTE — Telephone Encounter (Signed)
This is not the case. They denied the medication because she must first have try and fail zaleplon [Sonata]. I included patient has been taking medication since 2016.  Routing to provider to advise.

## 2019-04-15 ENCOUNTER — Ambulatory Visit: Payer: Self-pay | Attending: Internal Medicine

## 2019-04-15 DIAGNOSIS — Z23 Encounter for immunization: Secondary | ICD-10-CM | POA: Insufficient documentation

## 2019-04-15 NOTE — Progress Notes (Signed)
   Covid-19 Vaccination Clinic  Name:  Jasmine Camacho    MRN: [id number] DOB: [birth year]  04/15/2019  Jasmine Camacho was observed post Covid-19 immunization for 15 minutes without incident. She was provided with Vaccine Information Sheet and instruction to access the V-Safe system.   Jasmine Camacho was instructed to call 911 with any severe reactions post vaccine: Marland Kitchen Difficulty breathing  . Swelling of face and throat  . A fast heartbeat  . A bad rash all over body  . Dizziness and weakness   Immunizations Administered    Name Date Dose VIS Date Route   Pfizer COVID-19 Vaccine 04/15/2019  9:25 AM 0.3 mL 01/20/2019 Intramuscular   Manufacturer: [employer], [employer]   Lot: [id number]   NDC: [address]-0

## 2019-05-16 ENCOUNTER — Other Ambulatory Visit: Payer: Self-pay

## 2019-05-16 ENCOUNTER — Ambulatory Visit (INDEPENDENT_AMBULATORY_CARE_PROVIDER_SITE_OTHER): Payer: PRIVATE HEALTH INSURANCE | Admitting: Family Medicine

## 2019-05-16 ENCOUNTER — Encounter: Payer: Self-pay | Admitting: Family Medicine

## 2019-05-16 VITALS — BP 142/98 | HR 99 | Temp 97.8°F | Ht 63.39 in | Wt 139.4 lb

## 2019-05-16 DIAGNOSIS — D509 Iron deficiency anemia, unspecified: Secondary | ICD-10-CM

## 2019-05-16 DIAGNOSIS — R634 Abnormal weight loss: Secondary | ICD-10-CM

## 2019-05-16 DIAGNOSIS — J449 Chronic obstructive pulmonary disease, unspecified: Secondary | ICD-10-CM | POA: Diagnosis not present

## 2019-05-16 DIAGNOSIS — R195 Other fecal abnormalities: Secondary | ICD-10-CM

## 2019-05-16 DIAGNOSIS — F5101 Primary insomnia: Secondary | ICD-10-CM

## 2019-05-16 DIAGNOSIS — J301 Allergic rhinitis due to pollen: Secondary | ICD-10-CM

## 2019-05-16 DIAGNOSIS — R0602 Shortness of breath: Secondary | ICD-10-CM

## 2019-05-16 LAB — BAYER DCA HB A1C WAIVED: HB A1C (BAYER DCA - WAIVED): 5.6 % (ref <7.0)

## 2019-05-16 MED ORDER — PREDNISONE 50 MG PO TABS: 50.0000 mg | ORAL_TABLET | Freq: Every day | ORAL | 0 refills | Status: DC

## 2019-05-16 MED ORDER — CETIRIZINE HCL 10 MG PO TABS: 10.0000 mg | ORAL_TABLET | Freq: Every day | ORAL | 11 refills | Status: DC

## 2019-05-16 MED ORDER — ESZOPICLONE 3 MG PO TABS: 3.0000 mg | ORAL_TABLET | Freq: Every day | ORAL | 2 refills | Status: DC

## 2019-05-16 MED ORDER — SPIRIVA HANDIHALER 18 MCG IN CAPS: 18.0000 ug | ORAL_CAPSULE | Freq: Every day | RESPIRATORY_TRACT | 12 refills | Status: DC

## 2019-05-16 MED ORDER — FLUTICASONE-SALMETEROL 100-50 MCG/DOSE IN AEPB: 1.0000 | INHALATION_SPRAY | Freq: Two times a day (BID) | RESPIRATORY_TRACT | 3 refills | Status: DC

## 2019-05-16 NOTE — Progress Notes (Signed)
BP (!) 142/98 (BP Location: Left Arm, Patient Position: Sitting, Cuff Size: Small)   Pulse 99   Temp 97.8 F (36.6 C) (Oral)   Ht 5' 3.39" (1.61 m)   Wt 139 lb 6.4 oz (63.2 kg)   LMP  (LMP Unknown)   SpO2 96%   BMI 24.39 kg/m    Subjective:    Patient ID: Jasmine Camacho, female    DOB: 1955-10-15, 64 y.o.   MRN: [id number]  HPI: Jasmine Camacho is a 64 y.o. female  Chief Complaint  Patient presents with  . Insomnia  . Medication Management    pt asking about advair to replace anoro.   Marland Kitchen Shortness of Breath    pt states she believes she has pneumonia again.    Has been losing weight- 30lbs in the last year, has had some issues with her teeth which has made it harder to eat, but has increased her boost. She notes that she has not been trying to lose weight. She also notes that she has had some very dark stools and significant increase in SOB. She cannot walk from her house to the car without significant SOB and needing to rest. She has been having a lot of wheezing and SOB. She had pneumonia x2 over the summer diagnosed at outside urgent cares. Imaging not available. Follow up CXR in June was normal.   UPPER RESPIRATORY TRACT INFECTION Duration: 3 weeks.  Worst symptom: congestions Fever: no Cough: yes Shortness of breath: yes Wheezing: yes Chest pain: no Chest tightness: no Chest congestion: yes Nasal congestion: yes Runny nose: yes Post nasal drip: yes Sneezing: yes Sore throat: no Swollen glands: no Sinus pressure: yes Headache: yes Face pain: no Toothache: no Ear pain: no  Ear pressure: no  Eyes red/itching:no Eye drainage/crusting: no  Vomiting: yes Rash: no Fatigue: yes Sick contacts: no Strep contacts: no  Context: stable Recurrent sinusitis: no Relief with OTC cold/cough medications: no  Treatments attempted: none   COPD- for about 10 months  COPD status: exacerbated Satisfied with current treatment?: no Oxygen use: no Dyspnea frequency: all the  time Cough frequency: all the time Rescue inhaler frequency:  Every 6 hours Limitation of activity: yes Productive cough: yes Pneumovax: Up to Date Influenza: Up to Date  INSOMNIA Duration: chronic Satisfied with sleep quality: yes Difficulty falling asleep: no Difficulty staying asleep: no Waking a few hours after sleep onset: no Early morning awakenings: no Daytime hypersomnolence: no Wakes feeling refreshed: yes Good sleep hygiene: yes Apnea: no Snoring: no Depressed/anxious mood: no Recent stress: no Restless legs/nocturnal leg cramps: no Chronic pain/arthritis: no History of sleep study: no Treatments attempted: melatonin, uinsom, benadryl and ambien    Relevant past medical, surgical, family and social history reviewed and updated as indicated. Interim medical history since our last visit reviewed. Allergies and medications reviewed and updated.  Review of Systems  Constitutional: Positive for activity change, fatigue and unexpected weight change. Negative for appetite change, chills, diaphoresis and fever.  HENT: Positive for congestion, dental problem, nosebleeds, postnasal drip, rhinorrhea, sinus pressure and sneezing. Negative for drooling, ear discharge, ear pain, facial swelling, hearing loss, mouth sores, sinus pain, sore throat, tinnitus, trouble swallowing and voice change.   Eyes: Negative.   Respiratory: Positive for cough, chest tightness, shortness of breath and wheezing. Negative for apnea, choking and stridor.   Cardiovascular: Negative.   Gastrointestinal: Positive for abdominal pain, nausea and vomiting. Negative for abdominal distention, anal bleeding, blood in stool, constipation,  diarrhea and rectal pain.  Musculoskeletal: Negative.   Neurological: Negative.   Psychiatric/Behavioral: Negative.     Per HPI unless specifically indicated above     Objective:    BP (!) 142/98 (BP Location: Left Arm, Patient Position: Sitting, Cuff Size: Small)    Pulse 99   Temp 97.8 F (36.6 C) (Oral)   Ht 5' 3.39" (1.61 m)   Wt 139 lb 6.4 oz (63.2 kg)   LMP  (LMP Unknown)   SpO2 96%   BMI 24.39 kg/m   Wt Readings from Last 3 Encounters:  05/16/19 139 lb 6.4 oz (63.2 kg)  02/14/19 147 lb (66.7 kg)  01/24/19 146 lb 8 oz (66.5 kg)    Physical Exam Vitals and nursing note reviewed.  Constitutional:      General: She is not in acute distress.    Appearance: Normal appearance. She is well-developed. She is not ill-appearing, toxic-appearing or diaphoretic.  HENT:     Head: Normocephalic and atraumatic.     Right Ear: External ear normal.     Left Ear: External ear normal.     Nose: Nose normal.     Mouth/Throat:     Mouth: Mucous membranes are moist.     Pharynx: Oropharynx is clear.  Eyes:     General: No scleral icterus.       Right eye: No discharge.        Left eye: No discharge.     Extraocular Movements: Extraocular movements intact.     Conjunctiva/sclera: Conjunctivae normal.     Pupils: Pupils are equal, round, and reactive to light.  Cardiovascular:     Rate and Rhythm: Normal rate and regular rhythm.     Pulses: Normal pulses.     Heart sounds: Normal heart sounds. No murmur. No friction rub. No gallop.   Pulmonary:     Effort: Pulmonary effort is normal. No respiratory distress.     Breath sounds: Normal breath sounds. No stridor. No wheezing, rhonchi or rales.  Chest:     Chest wall: No tenderness.  Abdominal:     General: Bowel sounds are normal.     Palpations: Abdomen is soft. There is no hepatomegaly, splenomegaly or mass.     Tenderness: There is no abdominal tenderness. There is no guarding or rebound.  Musculoskeletal:        General: Normal range of motion.     Cervical back: Normal range of motion and neck supple.  Skin:    General: Skin is warm and dry.     Capillary Refill: Capillary refill takes less than 2 seconds.     Coloration: Skin is pale. Skin is not jaundiced.     Findings: No bruising,  erythema, lesion or rash.  Neurological:     General: No focal deficit present.     Mental Status: She is alert and oriented to person, place, and time. Mental status is at baseline.  Psychiatric:        Mood and Affect: Mood normal. Mood is not anxious.        Behavior: Behavior normal. Behavior is not agitated.        Thought Content: Thought content normal.        Judgment: Judgment normal.     Results for orders placed or performed in visit on 02/14/19  Basic Metabolic Panel (BMET)  Result Value Ref Range   Glucose 87 65 - 99 mg/dL   BUN 13 8 - 27 mg/dL  Creatinine, Ser 0.82 0.57 - 1.00 mg/dL   GFR calc non Af Amer 77 >59 mL/min/1.73   GFR calc Af Amer 89 >59 mL/min/1.73   BUN/Creatinine Ratio 16 12 - 28   Sodium 140 134 - 144 mmol/L   Potassium 3.6 3.5 - 5.2 mmol/L   Chloride 100 96 - 106 mmol/L   CO2 24 20 - 29 mmol/L   Calcium 10.0 8.7 - 10.3 mg/dL  CBC with Differential/Platelet  Result Value Ref Range   WBC 7.0 3.4 - 10.8 x10E3/uL   RBC 4.07 3.77 - 5.28 x10E6/uL   Hemoglobin 11.2 11.1 - 15.9 g/dL   Hematocrit 51.8 84.1 - 46.6 %   MCV 87 79 - 97 fL   MCH 27.5 26.6 - 33.0 pg   MCHC 31.5 31.5 - 35.7 g/dL   RDW 66.0 (H) 63.0 - 16.0 %   Platelets 313 150 - 450 x10E3/uL   Neutrophils 64 Not Estab. %   Lymphs 21 Not Estab. %   Monocytes 9 Not Estab. %   Eos 5 Not Estab. %   Basos 1 Not Estab. %   Neutrophils Absolute 4.5 1.4 - 7.0 x10E3/uL   Lymphocytes Absolute 1.4 0.7 - 3.1 x10E3/uL   Monocytes Absolute 0.6 0.1 - 0.9 x10E3/uL   EOS (ABSOLUTE) 0.4 0.0 - 0.4 x10E3/uL   Basophils Absolute 0.1 0.0 - 0.2 x10E3/uL   Immature Granulocytes 0 Not Estab. %   Immature Grans (Abs) 0.0 0.0 - 0.1 x10E3/uL  Iron and TIBC  Result Value Ref Range   Total Iron Binding Capacity 377 250 - 450 ug/dL   UIBC 109 323 - 557 ug/dL   Iron 49 27 - 322 ug/dL   Iron Saturation 13 (L) 15 - 55 %  Ferritin  Result Value Ref Range   Ferritin 89 15 - 150 ng/mL      Assessment & Plan:    Problem List Items Addressed This Visit      Respiratory   Seasonal allergic rhinitis    Will treat with zyrtec and prednisone burst. If not getting better or getting worse, let us know.       Chronic obstructive pulmonary disease ([facility])    Worsened. Concern for lung mass given history of pneumonia and weight loss. Will obtain CT of chest. Await results. Anoro is very expensive for her. Will change to advair and add spiriva. Recheck 1 month.       Relevant Medications   Fluticasone-Salmeterol (ADVAIR) 100-50 MCG/DOSE AEPB   tiotropium (SPIRIVA HANDIHALER) 18 MCG inhalation capsule   predniSONE (DELTASONE) 50 MG tablet   cetirizine (ZYRTEC) 10 MG tablet     Other   Insomnia    Under good control on current regimen. Continue current regimen. Continue to monitor. Call with any concerns. Refills given for 3 months. Follow up in 3 months.        Iron deficiency anemia    Refuses colonoscopy. Refused to do cologuard after it was ordered in December. Continues with change in stool, ?melena, significant weight loss. Will check CT abdomen and pelvis with contrast to r/o mass. Await results. Rechecking labs today.       Other Visit Diagnoses    Weight loss    -  Primary   30lbs in the last year. Changes in stool, melena and significant SOB. Will obtain CT chest/abdomen/pelvis to r/o mass. Await results.    Relevant Orders   CT Chest Wo Contrast   CBC with Differential/Platelet   Comprehensive metabolic  panel   Bayer DCA Hb A1c Waived   TSH   CT Abdomen Pelvis W Contrast   Shortness of breath       Relevant Orders   CT Chest Wo Contrast   Change in stool       Relevant Orders   CT Abdomen Pelvis W Contrast       Follow up plan: Return in about 4 weeks (around 06/13/2019).   >45 minutes spent in counseling and coordination of care with patient today

## 2019-05-16 NOTE — Assessment & Plan Note (Signed)
Will treat with zyrtec and prednisone burst. If not getting better or getting worse, let us know.

## 2019-05-16 NOTE — Assessment & Plan Note (Signed)
Refuses colonoscopy. Refused to do cologuard after it was ordered in December. Continues with change in stool, ?melena, significant weight loss. Will check CT abdomen and pelvis with contrast to r/o mass. Await results. Rechecking labs today.

## 2019-05-16 NOTE — Assessment & Plan Note (Signed)
Under good control on current regimen. Continue current regimen. Continue to monitor. Call with any concerns. Refills given for 3 months. Follow up in 3 months.   

## 2019-05-16 NOTE — Assessment & Plan Note (Signed)
Worsened. Concern for lung mass given history of pneumonia and weight loss. Will obtain CT of chest. Await results. Anoro is very expensive for her. Will change to advair and add spiriva. Recheck 1 month.

## 2019-05-17 ENCOUNTER — Encounter: Payer: Self-pay | Admitting: Family Medicine

## 2019-05-17 LAB — CBC WITH DIFFERENTIAL/PLATELET
Basophils Absolute: 0.1 10*3/uL (ref 0.0–0.2)
Basos: 1 %
EOS (ABSOLUTE): 0.3 10*3/uL (ref 0.0–0.4)
Eos: 5 %
Hematocrit: 35.3 % (ref 34.0–46.6)
Hemoglobin: 11.4 g/dL (ref 11.1–15.9)
Immature Grans (Abs): 0 10*3/uL (ref 0.0–0.1)
Immature Granulocytes: 0 %
Lymphocytes Absolute: 1.2 10*3/uL (ref 0.7–3.1)
Lymphs: 17 %
MCH: 29.6 pg (ref 26.6–33.0)
MCHC: 32.3 g/dL (ref 31.5–35.7)
MCV: 92 fL (ref 79–97)
Monocytes Absolute: 0.5 10*3/uL (ref 0.1–0.9)
Monocytes: 8 %
Neutrophils Absolute: 4.7 10*3/uL (ref 1.4–7.0)
Neutrophils: 69 %
Platelets: 292 10*3/uL (ref 150–450)
RBC: 3.85 x10E6/uL (ref 3.77–5.28)
RDW: 15.8 % — ABNORMAL HIGH (ref 11.7–15.4)
WBC: 6.8 10*3/uL (ref 3.4–10.8)

## 2019-05-17 LAB — COMPREHENSIVE METABOLIC PANEL
ALT: 31 IU/L (ref 0–32)
AST: 22 IU/L (ref 0–40)
Albumin/Globulin Ratio: 1.6 (ref 1.2–2.2)
Albumin: 4.1 g/dL (ref 3.8–4.8)
Alkaline Phosphatase: 138 IU/L — ABNORMAL HIGH (ref 39–117)
BUN/Creatinine Ratio: 16 (ref 12–28)
BUN: 14 mg/dL (ref 8–27)
Bilirubin Total: 0.7 mg/dL (ref 0.0–1.2)
CO2: 21 mmol/L (ref 20–29)
Calcium: 9.5 mg/dL (ref 8.7–10.3)
Chloride: 102 mmol/L (ref 96–106)
Creatinine, Ser: 0.87 mg/dL (ref 0.57–1.00)
GFR calc Af Amer: 82 mL/min/{1.73_m2} (ref >59)
GFR calc non Af Amer: 71 mL/min/{1.73_m2} (ref >59)
Globulin, Total: 2.6 g/dL (ref 1.5–4.5)
Glucose: 111 mg/dL — ABNORMAL HIGH (ref 65–99)
Potassium: 3.6 mmol/L (ref 3.5–5.2)
Sodium: 139 mmol/L (ref 134–144)
Total Protein: 6.7 g/dL (ref 6.0–8.5)

## 2019-05-17 LAB — TSH: TSH: 1.09 u[IU]/mL (ref 0.450–4.500)

## 2019-05-29 ENCOUNTER — Ambulatory Visit: Payer: Self-pay | Admitting: Family Medicine

## 2019-05-29 ENCOUNTER — Telehealth: Payer: Self-pay | Admitting: Family Medicine

## 2019-05-29 DIAGNOSIS — R195 Other fecal abnormalities: Secondary | ICD-10-CM

## 2019-05-29 DIAGNOSIS — D509 Iron deficiency anemia, unspecified: Secondary | ICD-10-CM

## 2019-05-29 DIAGNOSIS — R0602 Shortness of breath: Secondary | ICD-10-CM

## 2019-05-29 DIAGNOSIS — R634 Abnormal weight loss: Secondary | ICD-10-CM

## 2019-05-29 NOTE — Telephone Encounter (Signed)
Referral Coordinator said that you can just call. Document the number which they give you (like an authorization number) so I can tell referrals know and they can schedule for the patient.

## 2019-05-29 NOTE — Telephone Encounter (Signed)
Copied from CRM [id number]. Topic: General - Other >> May 29, 2019  8:58 AM Wyonia Hough E wrote: Reason for CRM:  Ct scan was denied by insurance again and Pt needs to know what she needs to do next/please advise

## 2019-05-29 NOTE — Telephone Encounter (Signed)
See other telephone encounter. No paperwork sent to office. Needs peer to peer. Attempting to get that scheduled.

## 2019-05-29 NOTE — Telephone Encounter (Signed)
Needs peer to peer.   Please see if this can be scheduled, or if I just have to call. [phone]

## 2019-05-30 ENCOUNTER — Telehealth: Payer: Self-pay

## 2019-05-30 ENCOUNTER — Ambulatory Visit: Payer: PRIVATE HEALTH INSURANCE | Admitting: Family Medicine

## 2019-05-30 MED ORDER — MONTELUKAST SODIUM 10 MG PO TABS: 10.0000 mg | ORAL_TABLET | Freq: Every day | ORAL | 3 refills | Status: DC

## 2019-05-30 NOTE — Telephone Encounter (Signed)
Please let patient know that her insurance declined her CT scans. I've tried to get them to reconsider and they will not. They gave me the option of having her do a barium enema or doing a CXR.   I am still very concerned. I would say if she starts feeling worse again- I would advise her to go to the ER, and they'd be able to scan her there. Otherwise I've put in an order for a CXR and a referral to GI. We'll see what we can do.

## 2019-05-30 NOTE — Telephone Encounter (Signed)
The zyrtec is a 24 hour med. It will help during the day. I've sent through singulair to try to help too. It is also a 24 hour med although both of them are taken at bed time.

## 2019-05-30 NOTE — Telephone Encounter (Signed)
Called patient and left detailed message (DPR reviewed). Asked patient to return call if have any questions about this message.

## 2019-05-30 NOTE — Telephone Encounter (Signed)
Pt.notified

## 2019-05-30 NOTE — Telephone Encounter (Signed)
Pt requesting something for allergies for day time. She stated if there is something you recommend OTC she will do that as well.

## 2019-05-30 NOTE — Addendum Note (Signed)
Addended by: Dorcas Carrow on: 05/30/2019 03:12 PM   Modules accepted: Orders

## 2019-05-30 NOTE — Telephone Encounter (Signed)
Called for peer-to- peer at 11:34AM. On hold until 11:40AM.   Number incorrect. Transferred to different department. Picked up at 11:46AM.  Again transferred to the wrong department. Put on hold again and transferred at 11:48AM. Given Ref #:[id number]. Again the wrong department. Phone number for [facility] received. At 11:53AM.   Tracking number [id number]  Reason for denial- doctors note. Needs CXR and abdominal US  Ref #: [id number]  Informed that her case has been denied so that this would be for informational purposes only.   Called and spoke to physician. He recommends a barium enema and a CXR.

## 2019-06-02 ENCOUNTER — Telehealth: Payer: Self-pay | Admitting: Family Medicine

## 2019-06-02 MED ORDER — AZITHROMYCIN 250 MG PO TABS: ORAL_TABLET | ORAL | 0 refills | Status: DC

## 2019-06-02 NOTE — Telephone Encounter (Signed)
Patient notified

## 2019-06-02 NOTE — Addendum Note (Signed)
Addended by: Dorcas Carrow on: 06/02/2019 04:35 PM   Modules accepted: Orders

## 2019-06-02 NOTE — Telephone Encounter (Signed)
Copied from CRM [id number]. Topic: Quick Communication - Rx Refill/Question >> Jun 02, 2019  1:52 PM Dalphine Handing A wrote: Medication: Antibiotics  Has the patient contacted their pharmacy? Yes (Agent: If no, request that the patient contact the pharmacy for the refill.) (Agent: If yes, when and what did the pharmacy advise?)Contact PCP  Preferred Pharmacy (with phone number or street name): [id number] [place], [place] - [address] AT [facility] OF [address] [facility]  Phone:  [phone] Fax:  [phone]     Agent: Please be advised that RX refills may take up to 3 business days. We ask that you follow-up with your pharmacy.

## 2019-06-02 NOTE — Telephone Encounter (Signed)
Pt's husband [name],called regarding request for antibiotics. Pt taking a nap at the time of call. Pt's husband calling to request an antibiotic for a sinus infection. Pt's husband reports that the patient is experiencing congestion and had drainage that is dark cream/green in color.Patien't husband also coughing with trying to expel phlegm. Pt's husband states that the current symptoms are different from the regular congestion that the patient experiences.Denies fever at this time. Patient was treated for similar symptoms on 05/16/19. Patient's husband reports that since that visit the patient has been "on and off" with symptoms improving. Patient has f/u visit scheduled for 06/13/19. Patient's husband can be contacted at [phone].

## 2019-06-02 NOTE — Telephone Encounter (Signed)
Azithromycin sent to her pharmacy. If not better early next week, needs appointment.

## 2019-06-13 ENCOUNTER — Ambulatory Visit (INDEPENDENT_AMBULATORY_CARE_PROVIDER_SITE_OTHER): Payer: PRIVATE HEALTH INSURANCE | Admitting: Family Medicine

## 2019-06-13 ENCOUNTER — Encounter: Payer: Self-pay | Admitting: Family Medicine

## 2019-06-13 ENCOUNTER — Other Ambulatory Visit: Payer: Self-pay

## 2019-06-13 VITALS — BP 122/74 | HR 86 | Temp 98.5°F | Wt 143.2 lb

## 2019-06-13 DIAGNOSIS — R0602 Shortness of breath: Secondary | ICD-10-CM

## 2019-06-13 DIAGNOSIS — I5022 Chronic systolic (congestive) heart failure: Secondary | ICD-10-CM

## 2019-06-13 DIAGNOSIS — R634 Abnormal weight loss: Secondary | ICD-10-CM | POA: Diagnosis not present

## 2019-06-13 NOTE — Assessment & Plan Note (Signed)
To have a cardiac cath with cardiology. Lasix seems to be helping. Lungs clear today. Continue to monitor. Continue to follow with cardiology. Call with any concerns.

## 2019-06-13 NOTE — Progress Notes (Signed)
BP 122/74 (BP Location: Left Arm, Patient Position: Sitting, Cuff Size: Normal)   Pulse 86   Temp 98.5 F (36.9 C) (Oral)   Wt 143 lb 3.2 oz (65 kg)   LMP  (LMP Unknown)   SpO2 99%   BMI 25.06 kg/m    Subjective:    Patient ID: Jasmine Camacho, female    DOB: 1955-09-22, 64 y.o.   MRN: [id number]  HPI: Jasmine Camacho is a 64 y.o. female  Chief Complaint  Patient presents with  . COPD  . Weight Loss   Saw cardiology last week. Started on losartan, carvedilol and lasix for CHF. To continue her spironolactone. She is to have a heart cath which has not been scheduled yet. He referred her to pulmonology- she is to see them in 2 days. She notes that her breathing has been doing better. She continues to have "spells" in which she has chest pains, SOB, her heart will race and she will feel very anxious. This happens about 1x a week. It comes on at random. She notes that she thinks that she can control it, but then states that it scares her a lot and she cannot control it at all.   She has put on 4lbs since last visit. Has not been eating much. She has been having swelling since she saw Dr. Juliann Pares. Was started on lasix. Ankles are better, but continues with some swelling.   Her sinuses are better. Tolerated her azithromycin well. No other concerns or complaints at this time.   Relevant past medical, surgical, family and social history reviewed and updated as indicated. Interim medical history since our last visit reviewed. Allergies and medications reviewed and updated.  Review of Systems  Constitutional: Negative.   HENT: Negative.   Respiratory: Positive for shortness of breath. Negative for apnea, cough, choking, chest tightness, wheezing and stridor.   Cardiovascular: Positive for palpitations and leg swelling. Negative for chest pain.  Gastrointestinal: Negative.   Musculoskeletal: Negative.   Psychiatric/Behavioral: Negative.     Per HPI unless specifically indicated above    Objective:    BP 122/74 (BP Location: Left Arm, Patient Position: Sitting, Cuff Size: Normal)   Pulse 86   Temp 98.5 F (36.9 C) (Oral)   Wt 143 lb 3.2 oz (65 kg)   LMP  (LMP Unknown)   SpO2 99%   BMI 25.06 kg/m   Wt Readings from Last 3 Encounters:  06/13/19 143 lb 3.2 oz (65 kg)  05/16/19 139 lb 6.4 oz (63.2 kg)  02/14/19 147 lb (66.7 kg)    Physical Exam Vitals and nursing note reviewed.  Constitutional:      General: She is not in acute distress.    Appearance: Normal appearance. She is not ill-appearing, toxic-appearing or diaphoretic.  HENT:     Head: Normocephalic and atraumatic.     Right Ear: External ear normal.     Left Ear: External ear normal.     Nose: Nose normal.     Mouth/Throat:     Mouth: Mucous membranes are moist.     Pharynx: Oropharynx is clear.  Eyes:     General: No scleral icterus.       Right eye: No discharge.        Left eye: No discharge.     Extraocular Movements: Extraocular movements intact.     Conjunctiva/sclera: Conjunctivae normal.     Pupils: Pupils are equal, round, and reactive to light.  Cardiovascular:  Rate and Rhythm: Normal rate and regular rhythm.     Pulses: Normal pulses.     Heart sounds: Normal heart sounds. No murmur. No friction rub. No gallop.   Pulmonary:     Effort: Pulmonary effort is normal. No respiratory distress.     Breath sounds: Normal breath sounds. No stridor. No wheezing, rhonchi or rales.  Chest:     Chest wall: No tenderness.  Musculoskeletal:        General: Normal range of motion.     Cervical back: Normal range of motion and neck supple.     Right lower leg: Edema (1+) present.     Left lower leg: Edema (trace) present.  Skin:    General: Skin is warm and dry.     Capillary Refill: Capillary refill takes less than 2 seconds.     Coloration: Skin is not jaundiced or pale.     Findings: No bruising, erythema, lesion or rash.  Neurological:     General: No focal deficit present.      Mental Status: She is alert and oriented to person, place, and time. Mental status is at baseline.  Psychiatric:        Mood and Affect: Mood normal.        Behavior: Behavior normal.        Thought Content: Thought content normal.        Judgment: Judgment normal.     Results for orders placed or performed in visit on 05/16/19  CBC with Differential/Platelet  Result Value Ref Range   WBC 6.8 3.4 - 10.8 x10E3/uL   RBC 3.85 3.77 - 5.28 x10E6/uL   Hemoglobin 11.4 11.1 - 15.9 g/dL   Hematocrit 35.3 34.0 - 46.6 %   MCV 92 79 - 97 fL   MCH 29.6 26.6 - 33.0 pg   MCHC 32.3 31.5 - 35.7 g/dL   RDW 15.8 (H) 11.7 - 15.4 %   Platelets 292 150 - 450 x10E3/uL   Neutrophils 69 Not Estab. %   Lymphs 17 Not Estab. %   Monocytes 8 Not Estab. %   Eos 5 Not Estab. %   Basos 1 Not Estab. %   Neutrophils Absolute 4.7 1.4 - 7.0 x10E3/uL   Lymphocytes Absolute 1.2 0.7 - 3.1 x10E3/uL   Monocytes Absolute 0.5 0.1 - 0.9 x10E3/uL   EOS (ABSOLUTE) 0.3 0.0 - 0.4 x10E3/uL   Basophils Absolute 0.1 0.0 - 0.2 x10E3/uL   Immature Granulocytes 0 Not Estab. %   Immature Grans (Abs) 0.0 0.0 - 0.1 x10E3/uL  Comprehensive metabolic panel  Result Value Ref Range   Glucose 111 (H) 65 - 99 mg/dL   BUN 14 8 - 27 mg/dL   Creatinine, Ser 0.87 0.57 - 1.00 mg/dL   GFR calc non Af Amer 71 >59 mL/min/1.73   GFR calc Af Amer 82 >59 mL/min/1.73   BUN/Creatinine Ratio 16 12 - 28   Sodium 139 134 - 144 mmol/L   Potassium 3.6 3.5 - 5.2 mmol/L   Chloride 102 96 - 106 mmol/L   CO2 21 20 - 29 mmol/L   Calcium 9.5 8.7 - 10.3 mg/dL   Total Protein 6.7 6.0 - 8.5 g/dL   Albumin 4.1 3.8 - 4.8 g/dL   Globulin, Total 2.6 1.5 - 4.5 g/dL   Albumin/Globulin Ratio 1.6 1.2 - 2.2   Bilirubin Total 0.7 0.0 - 1.2 mg/dL   Alkaline Phosphatase 138 (H) 39 - 117 IU/L   AST 22 0 - 40  IU/L   ALT 31 0 - 32 IU/L  Bayer DCA Hb A1c Waived  Result Value Ref Range   HB A1C (BAYER DCA - WAIVED) 5.6 <7.0 %  TSH  Result Value Ref Range   TSH  1.090 0.450 - 4.500 uIU/mL      Assessment & Plan:   Problem List Items Addressed This Visit      Cardiovascular and Mediastinum   Chronic systolic congestive heart failure (HCC) - Primary    To have a cardiac cath with cardiology. Lasix seems to be helping. Lungs clear today. Continue to monitor. Continue to follow with cardiology. Call with any concerns.       Relevant Medications   carvedilol (COREG) 3.125 MG tablet   losartan (COZAAR) 50 MG tablet    Other Visit Diagnoses    Weight loss       Weight stable. Possibly due to her swelling. Continue ensure. Continue to monitor. Call with any concerns.    Shortness of breath       To see pulmonology in a couple of days. Continue to monitor. Await their input.        Follow up plan: Return in about 2 months (around 08/13/2019).

## 2019-08-11 ENCOUNTER — Other Ambulatory Visit: Payer: Self-pay | Admitting: Family Medicine

## 2019-08-11 NOTE — Telephone Encounter (Signed)
Routing to provider  

## 2019-08-11 NOTE — Telephone Encounter (Signed)
Requested medication (s) are due for refill today: no  Requested medication (s) are on the active medication list: yes  Last refill:  07/16/2019  Future visit scheduled: yes  Notes to clinic:  this refill cannot be delegated    Requested Prescriptions  Pending Prescriptions Disp Refills   Eszopiclone 3 MG TABS [Pharmacy Med Name: ESZOPICLONE 3MG  TABLETS] 30 tablet     Sig: TAKE 1 TABLET BY MOUTH IMMEDIATELY BEFORE BEDTIME; MUST LAST 30 DAYS      Not Delegated - Psychiatry:  Anxiolytics/Hypnotics Failed - 08/11/2019 10:01 AM      Failed - This refill cannot be delegated      Failed - Urine Drug Screen completed in last 360 days.      Passed - Valid encounter within last 6 months    Recent Outpatient Visits           1 month ago Chronic systolic congestive heart failure (HCC)   [facility] [place], Megan P, DO   2 months ago Weight loss   [facility] [place], DO   5 months ago Hypertension, unspecified type   [facility], Megan P, DO   6 months ago Routine general medical examination at a health care facility   [facility], DO   8 months ago Atypical pneumonia   [facility], DO       Future Appointments             In 1 week Dorcas Carrow, Laural Benes, DO [facility], [facility]

## 2019-08-17 ENCOUNTER — Ambulatory Visit: Payer: PRIVATE HEALTH INSURANCE | Admitting: Family Medicine

## 2019-08-21 ENCOUNTER — Other Ambulatory Visit: Payer: Self-pay

## 2019-08-21 ENCOUNTER — Encounter: Payer: Self-pay | Admitting: Family Medicine

## 2019-08-21 ENCOUNTER — Ambulatory Visit (INDEPENDENT_AMBULATORY_CARE_PROVIDER_SITE_OTHER): Payer: PRIVATE HEALTH INSURANCE | Admitting: Family Medicine

## 2019-08-21 VITALS — BP 158/110 | HR 102 | Temp 97.8°F | Wt 139.8 lb

## 2019-08-21 DIAGNOSIS — F5101 Primary insomnia: Secondary | ICD-10-CM

## 2019-08-21 DIAGNOSIS — I700 Atherosclerosis of aorta: Secondary | ICD-10-CM | POA: Diagnosis not present

## 2019-08-21 DIAGNOSIS — Z23 Encounter for immunization: Secondary | ICD-10-CM | POA: Diagnosis not present

## 2019-08-21 DIAGNOSIS — R634 Abnormal weight loss: Secondary | ICD-10-CM

## 2019-08-21 DIAGNOSIS — I429 Cardiomyopathy, unspecified: Secondary | ICD-10-CM | POA: Diagnosis not present

## 2019-08-21 DIAGNOSIS — I10 Essential (primary) hypertension: Secondary | ICD-10-CM

## 2019-08-21 DIAGNOSIS — D509 Iron deficiency anemia, unspecified: Secondary | ICD-10-CM | POA: Diagnosis not present

## 2019-08-21 MED ORDER — ESZOPICLONE 3 MG PO TABS: ORAL_TABLET | ORAL | 2 refills | Status: DC

## 2019-08-21 MED ORDER — LOSARTAN POTASSIUM 50 MG PO TABS: 50.0000 mg | ORAL_TABLET | Freq: Every day | ORAL | 1 refills | Status: DC

## 2019-08-21 MED ORDER — MONTELUKAST SODIUM 10 MG PO TABS: 10.0000 mg | ORAL_TABLET | Freq: Every day | ORAL | 3 refills | Status: DC

## 2019-08-21 MED ORDER — FERROUS SULFATE 325 (65 FE) MG PO TBEC: 325.0000 mg | DELAYED_RELEASE_TABLET | Freq: Two times a day (BID) | ORAL | 3 refills | Status: DC

## 2019-08-21 MED ORDER — FLUTICASONE-SALMETEROL 100-50 MCG/DOSE IN AEPB: 1.0000 | INHALATION_SPRAY | Freq: Two times a day (BID) | RESPIRATORY_TRACT | 6 refills | Status: DC

## 2019-08-21 NOTE — Assessment & Plan Note (Signed)
She stopped her spironalactone due to abdominal pain about a month ago. Remain off spironalactone, but will increase her lostartan to 50mg  and recheck 3 months. Call with any concerns. Checking BMP today. Call with any concerns.

## 2019-08-21 NOTE — Assessment & Plan Note (Signed)
Under good control on current regimen. Continue current regimen. Continue to monitor. Call with any concerns. Refills given for 3 months. Follow up in 3 months.   

## 2019-08-21 NOTE — Assessment & Plan Note (Signed)
Rechecking labs today. Await results. Call with any concerns.  

## 2019-08-21 NOTE — Assessment & Plan Note (Signed)
Following with cardiology. Call with any concerns. Cannot take her spironolactone. Follow up with cardiology as able. Continue lasix.

## 2019-08-21 NOTE — Assessment & Plan Note (Signed)
Will keep BP and cholesterol under good control. Cannot take ASA due to allergy. Call with any concerns.

## 2019-08-21 NOTE — Progress Notes (Signed)
BP (!) 158/110 (BP Location: Left Arm, Cuff Size: Normal)   Pulse (!) 102   Temp 97.8 F (36.6 C) (Oral)   Wt 139 lb 12.8 oz (63.4 kg)   LMP  (LMP Unknown)   SpO2 100%   BMI 24.46 kg/m    Subjective:    Patient ID: Jasmine Camacho, female    DOB: 14-Dec-1955, 64 y.o.   MRN: [id number]  HPI: Jasmine Camacho is a 64 y.o. female  Chief Complaint  Patient presents with  . Insomnia    Eszopiclone 3 MG TABS refill  . Weight Loss   Saw pulmonology since last visit- thought to be SOB due to low EF (25-30%), mild pulmonary edema and COPD. They wanted to start her on oxygen due to sats dropping with activity, but she wanted to talk to her cardiologist first. When she saw her cardiologist, she was bradycardic in the 40s. They set her up for a holter to evaluate for permanent pacemaker, but they planned to continue to treat medically. At her follow up with pulmonology, she had refused oxygen or spiriva, but her SOB was better. She has not had her cath or her PFTs. They do not appear to be scheduled.  INSOMNIA Duration: chronic Satisfied with sleep quality: yes Difficulty falling asleep: no Difficulty staying asleep: no Waking a few hours after sleep onset: no Early morning awakenings: no Daytime hypersomnolence: no Wakes feeling refreshed: yes Good sleep hygiene: no Apnea: no Snoring: no Depressed/anxious mood: no Recent stress: no Restless legs/nocturnal leg cramps: no Chronic pain/arthritis: no History of sleep study: no Treatments attempted: melatonin, uinsom, benadryl and ambien   HYPERTENSION- cardiology reduced her losartan to 25mg  and her spironalactone to 12.5mg  daily and continued her on her 20mg  lasix. She did not take her medicine this AM, but usually takes them at lunch, and did take it at lunch yesterday. She stopped taking her spironalactone due to abdominal pain about a month ago.  Hypertension status: uncontrolled  Satisfied with current treatment? no Duration of  hypertension: chronic BP monitoring frequency:  not checking BP medication side effects:  yes Medication compliance: fair compliance Aspirin: no Recurrent headaches: no Visual changes: no Palpitations: no Dyspnea: no Chest pain: no Lower extremity edema: no Dizzy/lightheaded: no  ANEMIA Anemia status: stable Etiology of anemia: Duration of anemia treatment:  Compliance with treatment: fair compliance Iron supplementation side effects: no Severity of anemia: mild Fatigue: yes Decreased exercise tolerance: yes  Dyspnea on exertion: yes Palpitations: yes Bleeding: no Pica: no   Relevant past medical, surgical, family and social history reviewed and updated as indicated. Interim medical history since our last visit reviewed. Allergies and medications reviewed and updated.  Review of Systems  Constitutional: Positive for fatigue. Negative for activity change, appetite change, chills, diaphoresis, fever and unexpected weight change.  Respiratory: Negative.   Cardiovascular: Negative.   Gastrointestinal: Negative.   Musculoskeletal: Negative.   Neurological: Negative.   Psychiatric/Behavioral: Negative.     Per HPI unless specifically indicated above     Objective:    BP (!) 158/110 (BP Location: Left Arm, Cuff Size: Normal)   Pulse (!) 102   Temp 97.8 F (36.6 C) (Oral)   Wt 139 lb 12.8 oz (63.4 kg)   LMP  (LMP Unknown)   SpO2 100%   BMI 24.46 kg/m   Wt Readings from Last 3 Encounters:  08/21/19 139 lb 12.8 oz (63.4 kg)  06/13/19 143 lb 3.2 oz (65 kg)  05/16/19 139 lb  6.4 oz (63.2 kg)    Physical Exam Vitals and nursing note reviewed.  Constitutional:      General: She is not in acute distress.    Appearance: Normal appearance. She is not ill-appearing, toxic-appearing or diaphoretic.  HENT:     Head: Normocephalic and atraumatic.     Right Ear: External ear normal.     Left Ear: External ear normal.     Nose: Nose normal.     Mouth/Throat:     Mouth:  Mucous membranes are moist.     Pharynx: Oropharynx is clear.  Eyes:     General: No scleral icterus.       Right eye: No discharge.        Left eye: No discharge.     Extraocular Movements: Extraocular movements intact.     Conjunctiva/sclera: Conjunctivae normal.     Pupils: Pupils are equal, round, and reactive to light.  Cardiovascular:     Rate and Rhythm: Normal rate and regular rhythm.     Pulses: Normal pulses.     Heart sounds: Normal heart sounds. No murmur heard.  No friction rub. No gallop.   Pulmonary:     Effort: Pulmonary effort is normal. No respiratory distress.     Breath sounds: Normal breath sounds. No stridor. No wheezing, rhonchi or rales.  Chest:     Chest wall: No tenderness.  Musculoskeletal:        General: Normal range of motion.     Cervical back: Normal range of motion and neck supple.  Skin:    General: Skin is warm and dry.     Capillary Refill: Capillary refill takes less than 2 seconds.     Coloration: Skin is not jaundiced or pale.     Findings: No bruising, erythema, lesion or rash.  Neurological:     General: No focal deficit present.     Mental Status: She is alert and oriented to person, place, and time. Mental status is at baseline.  Psychiatric:        Mood and Affect: Mood normal.        Behavior: Behavior normal.        Thought Content: Thought content normal.        Judgment: Judgment normal.     Results for orders placed or performed in visit on 05/16/19  CBC with Differential/Platelet  Result Value Ref Range   WBC 6.8 3.4 - 10.8 x10E3/uL   RBC 3.85 3.77 - 5.28 x10E6/uL   Hemoglobin 11.4 11.1 - 15.9 g/dL   Hematocrit 96.7 89.3 - 46.6 %   MCV 92 79 - 97 fL   MCH 29.6 26.6 - 33.0 pg   MCHC 32.3 31 - 35 g/dL   RDW 81.0 (H) 17.5 - 10.2 %   Platelets 292 150 - 450 x10E3/uL   Neutrophils 69 Not Estab. %   Lymphs 17 Not Estab. %   Monocytes 8 Not Estab. %   Eos 5 Not Estab. %   Basos 1 Not Estab. %   Neutrophils Absolute 4.7  1 - 7 x10E3/uL   Lymphocytes Absolute 1.2 0 - 3 x10E3/uL   Monocytes Absolute 0.5 0 - 0 x10E3/uL   EOS (ABSOLUTE) 0.3 0.0 - 0.4 x10E3/uL   Basophils Absolute 0.1 0 - 0 x10E3/uL   Immature Granulocytes 0 Not Estab. %   Immature Grans (Abs) 0.0 0.0 - 0.1 x10E3/uL  Comprehensive metabolic panel  Result Value Ref Range   Glucose 111 (H) 65 -  99 mg/dL   BUN 14 8 - 27 mg/dL   Creatinine, Ser 1.61 0.57 - 1.00 mg/dL   GFR calc non Af Amer 71 >59 mL/min/1.73   GFR calc Af Amer 82 >59 mL/min/1.73   BUN/Creatinine Ratio 16 12 - 28   Sodium 139 134 - 144 mmol/L   Potassium 3.6 3.5 - 5.2 mmol/L   Chloride 102 96 - 106 mmol/L   CO2 21 20 - 29 mmol/L   Calcium 9.5 8.7 - 10.3 mg/dL   Total Protein 6.7 6.0 - 8.5 g/dL   Albumin 4.1 3.8 - 4.8 g/dL   Globulin, Total 2.6 1.5 - 4.5 g/dL   Albumin/Globulin Ratio 1.6 1.2 - 2.2   Bilirubin Total 0.7 0.0 - 1.2 mg/dL   Alkaline Phosphatase 138 (H) 39 - 117 IU/L   AST 22 0 - 40 IU/L   ALT 31 0 - 32 IU/L  Bayer DCA Hb A1c Waived  Result Value Ref Range   HB A1C (BAYER DCA - WAIVED) 5.6 <7.0 %  TSH  Result Value Ref Range   TSH 1.090 0.450 - 4.500 uIU/mL      Assessment & Plan:   Problem List Items Addressed This Visit      Cardiovascular and Mediastinum   Hypertension    She stopped her spironalactone due to abdominal pain about a month ago. Remain off spironalactone, but will increase her lostartan to 50mg  and recheck 3 months. Call with any concerns. Checking BMP today. Call with any concerns.       Relevant Medications   losartan (COZAAR) 50 MG tablet   Other Relevant Orders   Basic metabolic panel   Cardiomyopathy (HCC) - Primary    Following with cardiology. Call with any concerns. Cannot take her spironolactone. Follow up with cardiology as able. Continue lasix.        Relevant Medications   losartan (COZAAR) 50 MG tablet   Aortic atherosclerosis (HCC)    Will keep BP and cholesterol under good control. Cannot take ASA due to  allergy. Call with any concerns.       Relevant Medications   losartan (COZAAR) 50 MG tablet     Other   Insomnia    Under good control on current regimen. Continue current regimen. Continue to monitor. Call with any concerns. Refills given for 3 months. Follow up in 3 months.        Iron deficiency anemia    Rechecking labs today. Await results. Call with any concerns.       Relevant Medications   ferrous sulfate 325 (65 FE) MG EC tablet   Other Relevant Orders   CBC with Differential/Platelet   Iron and TIBC   Ferritin    Other Visit Diagnoses    Weight loss       Weight stable. Continue to monitor. Continue ensure.       Follow up plan: Return in about 3 months (around 11/19/2019) for BEFORE 10/10.

## 2019-08-22 LAB — CBC WITH DIFFERENTIAL/PLATELET
Basophils Absolute: 0.1 10*3/uL (ref 0.0–0.2)
Basos: 2 %
EOS (ABSOLUTE): 0.4 10*3/uL (ref 0.0–0.4)
Eos: 6 %
Hematocrit: 34.8 % (ref 34.0–46.6)
Hemoglobin: 11.5 g/dL (ref 11.1–15.9)
Immature Grans (Abs): 0 10*3/uL (ref 0.0–0.1)
Immature Granulocytes: 0 %
Lymphocytes Absolute: 2 10*3/uL (ref 0.7–3.1)
Lymphs: 29 %
MCH: 29 pg (ref 26.6–33.0)
MCHC: 33 g/dL (ref 31.5–35.7)
MCV: 88 fL (ref 79–97)
Monocytes Absolute: 0.7 10*3/uL (ref 0.1–0.9)
Monocytes: 10 %
Neutrophils Absolute: 3.6 10*3/uL (ref 1.4–7.0)
Neutrophils: 53 %
Platelets: 263 10*3/uL (ref 150–450)
RBC: 3.97 x10E6/uL (ref 3.77–5.28)
RDW: 17 % — ABNORMAL HIGH (ref 11.7–15.4)
WBC: 6.9 10*3/uL (ref 3.4–10.8)

## 2019-08-22 LAB — BASIC METABOLIC PANEL
BUN/Creatinine Ratio: 19 (ref 12–28)
BUN: 17 mg/dL (ref 8–27)
CO2: 22 mmol/L (ref 20–29)
Calcium: 9.8 mg/dL (ref 8.7–10.3)
Chloride: 104 mmol/L (ref 96–106)
Creatinine, Ser: 0.88 mg/dL (ref 0.57–1.00)
GFR calc Af Amer: 81 mL/min/{1.73_m2} (ref >59)
GFR calc non Af Amer: 70 mL/min/{1.73_m2} (ref >59)
Glucose: 109 mg/dL — ABNORMAL HIGH (ref 65–99)
Potassium: 4.4 mmol/L (ref 3.5–5.2)
Sodium: 140 mmol/L (ref 134–144)

## 2019-08-22 LAB — IRON AND TIBC
Iron Saturation: 24 % (ref 15–55)
Iron: 82 ug/dL (ref 27–139)
Total Iron Binding Capacity: 348 ug/dL (ref 250–450)
UIBC: 266 ug/dL (ref 118–369)

## 2019-08-22 LAB — FERRITIN: Ferritin: 164 ng/mL — ABNORMAL HIGH (ref 15–150)

## 2019-09-08 ENCOUNTER — Other Ambulatory Visit: Payer: Self-pay

## 2019-09-08 ENCOUNTER — Encounter: Payer: Self-pay | Admitting: Nurse Practitioner

## 2019-09-08 ENCOUNTER — Ambulatory Visit (INDEPENDENT_AMBULATORY_CARE_PROVIDER_SITE_OTHER): Payer: PRIVATE HEALTH INSURANCE | Admitting: Nurse Practitioner

## 2019-09-08 ENCOUNTER — Telehealth: Payer: Self-pay | Admitting: Family Medicine

## 2019-09-08 VITALS — BP 164/98 | HR 47 | Temp 98.1°F | Wt 135.0 lb

## 2019-09-08 DIAGNOSIS — R634 Abnormal weight loss: Secondary | ICD-10-CM

## 2019-09-08 DIAGNOSIS — I10 Essential (primary) hypertension: Secondary | ICD-10-CM

## 2019-09-08 DIAGNOSIS — R1012 Left upper quadrant pain: Secondary | ICD-10-CM

## 2019-09-08 DIAGNOSIS — R6881 Early satiety: Secondary | ICD-10-CM

## 2019-09-08 DIAGNOSIS — K921 Melena: Secondary | ICD-10-CM | POA: Diagnosis not present

## 2019-09-08 DIAGNOSIS — R0602 Shortness of breath: Secondary | ICD-10-CM | POA: Diagnosis not present

## 2019-09-08 DIAGNOSIS — R062 Wheezing: Secondary | ICD-10-CM | POA: Diagnosis not present

## 2019-09-08 MED ORDER — ALBUTEROL SULFATE (2.5 MG/3ML) 0.083% IN NEBU
2.5000 mg | INHALATION_SOLUTION | Freq: Once | RESPIRATORY_TRACT | Status: AC
  Administered 2019-09-08: 2.5 mg via RESPIRATORY_TRACT

## 2019-09-08 MED ORDER — PREDNISONE 20 MG PO TABS
40.0000 mg | ORAL_TABLET | Freq: Every day | ORAL | 0 refills | Status: DC
Start: 2019-09-08 — End: 2019-11-09

## 2019-09-08 MED ORDER — TIZANIDINE HCL 4 MG PO TABS
4.0000 mg | ORAL_TABLET | Freq: Four times a day (QID) | ORAL | 1 refills | Status: DC | PRN
Start: 2019-09-08 — End: 2019-11-09

## 2019-09-08 NOTE — Assessment & Plan Note (Signed)
Chronic, elevated in office today.  Strongly encouraged patient to resume losartan for blood pressure and monitor blood pressure at home.  Unlikely that elevated blood pressure is associated with LUQ pain.

## 2019-09-08 NOTE — Assessment & Plan Note (Signed)
Acute, ongoing.  Much improved with albuterol nebulizer.  Strongly encouraged patient to resume home inhaler for COPD as uncontrolled COPD may contribute to susceptibility to lung infections. CXR to be obtained today.

## 2019-09-08 NOTE — Assessment & Plan Note (Signed)
Acute x 3 weeks, ongoing.  With concerns findings of weight loss, history of melena, and early satiety, worried about possible mass causing pain - CT imaging of abdomen/pelvis needed to rule out internal cause of symptoms.  Cannot also rule out pulmonary cause such as pleuritis, will obtain chest x-ray today.  Will try burst of steroids and muscle relaxant in meantime.  Follow up in pain not improving early next week.

## 2019-09-08 NOTE — Progress Notes (Signed)
BP (!) 164/98   Pulse 47   Temp 98.1 F (36.7 C) (Oral)   Wt 135 lb (61.2 kg)   LMP  (LMP Unknown)   SpO2 98%   BMI 23.62 kg/m    Subjective:    Patient ID: Jasmine Camacho, female    DOB: Apr 22, 1955, 64 y.o.   MRN: [id number]  HPI: Jasmine Camacho is a 63 y.o. female presenting for abdominal pain.  Chief Complaint  Patient presents with  . Abdominal Pain    upper left sided x about 3 weeks   HYPERTENSION Patient reports stopping her losartan because she did not feel like she needed it anymore.  She does not like taking medication. Hypertension status: uncontrolled  Satisfied with current treatment? no Duration of hypertension: chronic BP monitoring frequency:  not checking BP medication side effects:  no  Medication compliance: poor compliance Previous BP meds: losartan Aspirin: no Recurrent headaches: no Visual changes: no Palpitations: no Dyspnea: no Chest pain: no Lower extremity edema: no Dizzy/lightheaded: no  ABDOMINAL PAIN  Patient reports ongoing abdominal pain for 3 weeks or more;   does not hurt first thing in the morning but around 10 am, it starts to hurt.  Seems to get worse with increased activity throughout the day. Duration:months Onset: gradual Severity: 10/10 Quality: sharp Location:  LUQ/ rib cage  Episode duration: hours Radiation: no Frequency: intermittent, with activity Alleviating factors: Tylenol; pressure Aggravating factors: activity,  Status: worse Treatments attempted: Tylenol Fever: none known Nausea: no Vomiting: no Weight loss: yes - 8 lbs in past 3 months Decreased appetite: yes Diarrhea: no Constipation: no Blood in stool: no Heartburn: no Jaundice: no Rash: no Dysuria/urinary frequency: no Hematuria: no History of sexually transmitted disease: no Recurrent NSAID use: does not use  Allergies  Allergen Reactions  . Ambien [Zolpidem]   . Asa [Aspirin]   . Benazepril Hcl   . Codeine Sulfate Nausea Only  .  Metoprolol Tartrate   . Other Other (See Comments)    Valtura  . Aldactone [Spironolactone] Other (See Comments)    Abdominal pain   . Hctz [Hydrochlorothiazide] Rash   Outpatient Encounter Medications as of 09/08/2019  Medication Sig  . acetaminophen (TYLENOL 8 HOUR ARTHRITIS PAIN) 650 MG CR tablet Take 650 mg by mouth every 8 (eight) hours as needed (pain or fever).   Marland Kitchen albuterol (VENTOLIN HFA) 108 (90 Base) MCG/ACT inhaler INL 2 PFS PO Q 4 TO 6 H PRN  . cetirizine (ZYRTEC) 10 MG tablet Take 1 tablet (10 mg total) by mouth at bedtime. For allergies  . cholecalciferol (VITAMIN D3) 25 MCG (1000 UNIT) tablet Take 1,000 Units by mouth daily.  . Eszopiclone 3 MG TABS TAKE 1 TABLET BY MOUTH IMMEDIATELY BEFORE BEDTIME- RXs MUST LAST 30 DAYS  . ferrous sulfate 325 (65 FE) MG EC tablet Take 1 tablet (325 mg total) by mouth 2 (two) times daily.  . Fluticasone-Salmeterol (ADVAIR) 100-50 MCG/DOSE AEPB Inhale 1 puff into the lungs 2 (two) times daily.  . furosemide (LASIX) 20 MG tablet Take 2 tablets (40 mg total) by mouth daily.  . methimazole (TAPAZOLE) 5 MG tablet Take 5 mg by mouth daily.   . montelukast (SINGULAIR) 10 MG tablet Take 1 tablet (10 mg total) by mouth at bedtime. PRN ALLERGIES  . losartan (COZAAR) 50 MG tablet Take 1 tablet (50 mg total) by mouth daily. (Patient not taking: Reported on 09/08/2019)  . nicotine (NICODERM CQ) 21 mg/24hr patch Place 1 patch (21  mg total) onto the skin daily. (Patient not taking: Reported on 11/29/2018)  . predniSONE (DELTASONE) 20 MG tablet Take 2 tablets (40 mg total) by mouth daily with breakfast.  . tiZANidine (ZANAFLEX) 4 MG tablet Take 1 tablet (4 mg total) by mouth every 6 (six) hours as needed for muscle spasms.  . [EXPIRED] albuterol (PROVENTIL) (2.5 MG/3ML) 0.083% nebulizer solution 2.5 mg    No facility-administered encounter medications on file as of 09/08/2019.   Patient Active Problem List   Diagnosis Date Noted  . Left upper quadrant pain  09/08/2019  . Wheezing 09/08/2019  . Chronic systolic congestive heart failure (HCC) 06/13/2019  . Chronic obstructive pulmonary disease (HCC) 02/14/2019  . Iron deficiency anemia 02/14/2019  . Brain aneurysm 12/03/2018  . Aortic atherosclerosis (HCC) 12/03/2018  . Cardiomyopathy (HCC) 11/30/2018  . Graves' disease 11/30/2018  . H/O spont intraventricular hemorrhage due to cerebral aneurysm 04/28/2016  . Aphasia 04/11/2015  . Insomnia 04/11/2015  . Seasonal allergic rhinitis 04/11/2015  . Ventricular premature contractions 04/11/2015  . Hypertension 04/11/2015  . Hyperthyroidism 05/02/2014  . Tobacco dependence 05/02/2014   Past Medical History:  Diagnosis Date  . Acute respiratory failure (HCC) 11/17/2018  . Aphasia   . Hypertension   . Insomnia   . Menopausal symptom   . Menorrhagia   . Pneumonia   . Seasonal allergies   . Tachycardia   . Thyroid disease   . Ventricular premature contractions    Relevant past medical, surgical, family and social history reviewed and updated as indicated. Interim medical history since our last visit reviewed.  Review of Systems  Constitutional: Positive for appetite change and unexpected weight change (weight loss). Negative for activity change, diaphoresis, fatigue and fever.  Eyes: Negative.  Negative for visual disturbance.  Respiratory: Negative.  Negative for cough, chest tightness, shortness of breath and wheezing.   Cardiovascular: Negative.  Negative for chest pain, palpitations and leg swelling.  Gastrointestinal: Positive for abdominal pain. Negative for blood in stool, diarrhea, nausea and vomiting.  Genitourinary: Negative.  Negative for dysuria and frequency.  Skin: Negative.  Negative for color change and pallor.  Neurological: Negative.  Negative for dizziness, light-headedness and headaches.  Psychiatric/Behavioral: Negative.  Negative for behavioral problems, confusion, decreased concentration and sleep disturbance. The  patient is not nervous/anxious.     Per HPI unless specifically indicated above     Objective:    BP (!) 164/98   Pulse 47   Temp 98.1 F (36.7 C) (Oral)   Wt 135 lb (61.2 kg)   LMP  (LMP Unknown)   SpO2 98%   BMI 23.62 kg/m   Wt Readings from Last 3 Encounters:  09/08/19 135 lb (61.2 kg)  08/21/19 139 lb 12.8 oz (63.4 kg)  06/13/19 143 lb 3.2 oz (65 kg)    Physical Exam Vitals and nursing note reviewed.  Constitutional:      General: She is not in acute distress.    Appearance: She is well-developed.  Eyes:     General: No scleral icterus.    Extraocular Movements: Extraocular movements intact.  Cardiovascular:     Rate and Rhythm: Normal rate and regular rhythm.     Heart sounds: Normal heart sounds. No murmur heard.   Pulmonary:     Effort: Pulmonary effort is normal. No respiratory distress.     Breath sounds: No stridor. Wheezing present. No rhonchi.  Abdominal:     General: There is distension. There is no abdominal bruit.  Tenderness: There is abdominal tenderness in the left upper quadrant. There is no right CVA tenderness or left CVA tenderness.     Hernia: No hernia is present.  Skin:    General: Skin is warm and dry.     Coloration: Skin is not jaundiced or pale.  Neurological:     General: No focal deficit present.     Mental Status: She is alert and oriented to person, place, and time.     Motor: No weakness.     Gait: Gait normal.  Psychiatric:        Mood and Affect: Mood normal.        Behavior: Behavior normal.        Thought Content: Thought content normal.        Judgment: Judgment normal.    Results for orders placed or performed in visit on 08/21/19  CBC with Differential/Platelet  Result Value Ref Range   WBC 6.9 3.4 - 10.8 x10E3/uL   RBC 3.97 3.77 - 5.28 x10E6/uL   Hemoglobin 11.5 11.1 - 15.9 g/dL   Hematocrit 16.1 09.6 - 46.6 %   MCV 88 79 - 97 fL   MCH 29.0 26.6 - 33.0 pg   MCHC 33.0 31 - 35 g/dL   RDW 04.5 (H) 40.9 - 81.1  %   Platelets 263 150 - 450 x10E3/uL   Neutrophils 53 Not Estab. %   Lymphs 29 Not Estab. %   Monocytes 10 Not Estab. %   Eos 6 Not Estab. %   Basos 2 Not Estab. %   Neutrophils Absolute 3.6 1 - 7 x10E3/uL   Lymphocytes Absolute 2.0 0 - 3 x10E3/uL   Monocytes Absolute 0.7 0 - 0 x10E3/uL   EOS (ABSOLUTE) 0.4 0.0 - 0.4 x10E3/uL   Basophils Absolute 0.1 0 - 0 x10E3/uL   Immature Granulocytes 0 Not Estab. %   Immature Grans (Abs) 0.0 0.0 - 0.1 x10E3/uL  Iron and TIBC  Result Value Ref Range   Total Iron Binding Capacity 348 250 - 450 ug/dL   UIBC 914 782 - 956 ug/dL   Iron 82 27 - 213 ug/dL   Iron Saturation 24 15 - 55 %  Ferritin  Result Value Ref Range   Ferritin 164 (H) 15.0 - 150.0 ng/mL  Basic metabolic panel  Result Value Ref Range   Glucose 109 (H) 65 - 99 mg/dL   BUN 17 8 - 27 mg/dL   Creatinine, Ser 0.86 0.57 - 1.00 mg/dL   GFR calc non Af Amer 70 >59 mL/min/1.73   GFR calc Af Amer 81 >59 mL/min/1.73   BUN/Creatinine Ratio 19 12 - 28   Sodium 140 134 - 144 mmol/L   Potassium 4.4 3.5 - 5.2 mmol/L   Chloride 104 96 - 106 mmol/L   CO2 22 20 - 29 mmol/L   Calcium 9.8 8.7 - 10.3 mg/dL      Assessment & Plan:   Problem List Items Addressed This Visit      Cardiovascular and Mediastinum   Hypertension    Chronic, elevated in office today.  Strongly encouraged patient to resume losartan for blood pressure and monitor blood pressure at home.  Unlikely that elevated blood pressure is associated with LUQ pain.        Other   Left upper quadrant pain - Primary    Acute x 3 weeks, ongoing.  With concerns findings of weight loss, history of melena, and early satiety, worried about possible mass causing pain -  CT imaging of abdomen/pelvis needed to rule out internal cause of symptoms.  Cannot also rule out pulmonary cause such as pleuritis, will obtain chest x-ray today.  Will try burst of steroids and muscle relaxant in meantime.  Follow up in pain not improving early next  week.      Relevant Orders   CT Abdomen Pelvis W Contrast   Wheezing    Acute, ongoing.  Much improved with albuterol nebulizer.  Strongly encouraged patient to resume home inhaler for COPD as uncontrolled COPD may contribute to susceptibility to lung infections. CXR to be obtained today.      Relevant Orders   DG Chest 2 View    Other Visit Diagnoses    Weight loss       Relevant Orders   CT Abdomen Pelvis W Contrast   Melena       Relevant Orders   CT Abdomen Pelvis W Contrast   Early satiety       Relevant Orders   CT Abdomen Pelvis W Contrast       Follow up plan: Return in about 4 weeks (around 10/06/2019) for abdominal pain follow up.   45+ minutes spent with patient today.

## 2019-09-08 NOTE — Telephone Encounter (Signed)
Not our pt

## 2019-09-08 NOTE — Patient Instructions (Signed)
CT Scan  A CT scan is a kind of X-ray. A CT scan makes pictures of the inside of your body. In this procedure, the pictures will be taken in a large machine that has an opening (CT scanner). What happens before the procedure? Staying hydrated Follow instructions from your doctor about hydration, which may include:  Up to 2 hours before the procedure - you may continue to drink clear liquids. These include water, clear fruit juice, black coffee, and plain tea. Eating and drinking restrictions Follow instructions from your doctor about eating and drinking, which may include:  24 hours before the procedure - stop drinking caffeinated drinks. These include energy drinks, tea, soda, coffee, and hot chocolate.  8 hours before the procedure - stop eating heavy meals or foods. These include meat, fried foods, or fatty foods.  6 hours before the procedure - stop eating light meals or foods. These include toast or cereal.  6 hours before the procedure - stop drinking milk or drinks that have milk in them.  2 hours before the procedure - stop drinking clear liquids. General instructions  Take off any jewelry.  Ask your doctor about changing or stopping your normal medicines. This is important if you take diabetes medicines or blood thinners. What happens during the procedure?  You will lie on a table with your arms above your head.  An IV tube may be put into one of your veins.  Dye may be put into the IV tube. You may feel warm or have a metal taste in your mouth.  The table you will be lying on will move into the CT scanner.  You will be able to see, hear, and talk to the person who is running the machine while you are in it. Follow that person's directions.  The machine will move around you to take pictures. Do not move.  When the machine is done taking pictures, it will be turned off.  The table will be moved out of the machine.  Your IV tube will be taken out. The procedure may  vary among doctors and hospitals. What happens after the procedure?  It is up to you to get your results. Ask when your results will be ready. Summary  A CT scan is a kind of X-ray.  A CT scan makes pictures of the inside of your body.  Follow instructions from your doctor about eating and drinking before the procedure.  You will be able to see, hear, and talk to the person who is running the machine while you are in it. Follow that person's directions. This information is not intended to replace advice given to you by your health care provider. Make sure you discuss any questions you have with your health care provider. Document Revised: 02/13/2016 Document Reviewed: 02/13/2016 Elsevier Patient Education  2020 [employer].

## 2019-09-08 NOTE — Telephone Encounter (Signed)
Patient husband is calling to ask should the patient still take carvedilol (COREG) 3.125 MG tablet [[id number]] DISCONTINUED- Patient is still taking this medication. Please advise  CB- [phone]

## 2019-09-15 ENCOUNTER — Other Ambulatory Visit: Payer: Self-pay | Admitting: Family Medicine

## 2019-09-20 ENCOUNTER — Ambulatory Visit: Admission: RE | Admit: 2019-09-20 | Payer: PRIVATE HEALTH INSURANCE | Source: Ambulatory Visit

## 2019-10-13 ENCOUNTER — Other Ambulatory Visit: Payer: Self-pay | Admitting: Family Medicine

## 2019-11-09 ENCOUNTER — Other Ambulatory Visit: Payer: Self-pay

## 2019-11-09 ENCOUNTER — Ambulatory Visit (INDEPENDENT_AMBULATORY_CARE_PROVIDER_SITE_OTHER): Payer: PRIVATE HEALTH INSURANCE | Admitting: Family Medicine

## 2019-11-09 ENCOUNTER — Encounter: Payer: Self-pay | Admitting: Family Medicine

## 2019-11-09 VITALS — BP 146/90 | HR 101 | Temp 98.2°F | Wt 141.0 lb

## 2019-11-09 DIAGNOSIS — F5101 Primary insomnia: Secondary | ICD-10-CM | POA: Diagnosis not present

## 2019-11-09 DIAGNOSIS — R062 Wheezing: Secondary | ICD-10-CM | POA: Diagnosis not present

## 2019-11-09 DIAGNOSIS — Z23 Encounter for immunization: Secondary | ICD-10-CM | POA: Diagnosis not present

## 2019-11-09 DIAGNOSIS — I10 Essential (primary) hypertension: Secondary | ICD-10-CM | POA: Diagnosis not present

## 2019-11-09 MED ORDER — ESZOPICLONE 3 MG PO TABS: ORAL_TABLET | ORAL | 2 refills | Status: DC

## 2019-11-09 MED ORDER — ALBUTEROL SULFATE HFA 108 (90 BASE) MCG/ACT IN AERS: INHALATION_SPRAY | RESPIRATORY_TRACT | 6 refills | Status: DC

## 2019-11-09 NOTE — Assessment & Plan Note (Signed)
Under good control on current regimen. Continue current regimen. Continue to monitor. Call with any concerns. Refills given for 3 months. Follow up 3 months.    

## 2019-11-09 NOTE — Assessment & Plan Note (Signed)
Will continue inhalers and call if not getting better or getting worse. Continue to monitor.

## 2019-11-09 NOTE — Progress Notes (Signed)
BP (!) 146/90 (BP Location: Left Arm, Cuff Size: Normal)    Pulse (!) 101    Temp 98.2 F (36.8 C) (Oral)    Wt 141 lb (64 kg)    LMP  (LMP Unknown)    SpO2 96%    BMI 24.67 kg/m    Subjective:    Patient ID: Jasmine Camacho, female    DOB: 21-Feb-1955, 64 y.o.   MRN: [id number]  HPI: Jasmine Camacho is a 64 y.o. female  Chief Complaint  Patient presents with   Hypertension   Insomnia   HYPERTENSION Hypertension status: stable  Satisfied with current treatment? yes Duration of hypertension: chronic BP monitoring frequency:  not checking BP medication side effects:  no Medication compliance: good compliance Aspirin: no Recurrent headaches: no Visual changes: no Palpitations: no Dyspnea: no Chest pain: no Lower extremity edema: no Dizzy/lightheaded: no   INSOMNIA Duration: chronic Satisfied with sleep quality: yes Difficulty falling asleep: no Difficulty staying asleep: no Waking a few hours after sleep onset: no Early morning awakenings: no Daytime hypersomnolence: no Wakes feeling refreshed: yes Good sleep hygiene: yes Apnea: no Snoring: no Depressed/anxious mood: no Recent stress: no Restless legs/nocturnal leg cramps: no Chronic pain/arthritis: no History of sleep study: yes Treatments attempted: melatonin, uinsom, benadryl and ambien   Relevant past medical, surgical, family and social history reviewed and updated as indicated. Interim medical history since our last visit reviewed. Allergies and medications reviewed and updated.  Review of Systems  Constitutional: Negative.   Respiratory: Negative.   Cardiovascular: Negative.   Gastrointestinal: Negative.   Musculoskeletal: Negative.   Skin: Negative.   Psychiatric/Behavioral: Positive for sleep disturbance. Negative for agitation, behavioral problems, confusion, decreased concentration, dysphoric mood, hallucinations, self-injury and suicidal ideas. The patient is not nervous/anxious and is not  hyperactive.     Per HPI unless specifically indicated above     Objective:    BP (!) 146/90 (BP Location: Left Arm, Cuff Size: Normal)    Pulse (!) 101    Temp 98.2 F (36.8 C) (Oral)    Wt 141 lb (64 kg)    LMP  (LMP Unknown)    SpO2 96%    BMI 24.67 kg/m   Wt Readings from Last 3 Encounters:  11/09/19 141 lb (64 kg)  09/08/19 135 lb (61.2 kg)  08/21/19 139 lb 12.8 oz (63.4 kg)    Physical Exam Vitals and nursing note reviewed.  Constitutional:      General: She is not in acute distress.    Appearance: Normal appearance. She is not ill-appearing, toxic-appearing or diaphoretic.  HENT:     Head: Normocephalic and atraumatic.     Right Ear: External ear normal.     Left Ear: External ear normal.     Nose: Nose normal.     Mouth/Throat:     Mouth: Mucous membranes are moist.     Pharynx: Oropharynx is clear.  Eyes:     General: No scleral icterus.       Right eye: No discharge.        Left eye: No discharge.     Extraocular Movements: Extraocular movements intact.     Conjunctiva/sclera: Conjunctivae normal.     Pupils: Pupils are equal, round, and reactive to light.  Cardiovascular:     Rate and Rhythm: Normal rate and regular rhythm.     Pulses: Normal pulses.     Heart sounds: Normal heart sounds. No murmur heard.  No friction rub. No  gallop.   Pulmonary:     Effort: Pulmonary effort is normal. No respiratory distress.     Breath sounds: No stridor. Wheezing (scattered on forced expiration) present. No rhonchi or rales.  Chest:     Chest wall: No tenderness.  Musculoskeletal:        General: Normal range of motion.     Cervical back: Normal range of motion and neck supple.  Skin:    General: Skin is warm and dry.     Capillary Refill: Capillary refill takes less than 2 seconds.     Coloration: Skin is not jaundiced or pale.     Findings: No bruising, erythema, lesion or rash.  Neurological:     General: No focal deficit present.     Mental Status: She is  alert and oriented to person, place, and time. Mental status is at baseline.  Psychiatric:        Mood and Affect: Mood normal.        Behavior: Behavior normal.        Thought Content: Thought content normal.        Judgment: Judgment normal.     Results for orders placed or performed in visit on 08/21/19  CBC with Differential/Platelet  Result Value Ref Range   WBC 6.9 3.4 - 10.8 x10E3/uL   RBC 3.97 3.77 - 5.28 x10E6/uL   Hemoglobin 11.5 11.1 - 15.9 g/dL   Hematocrit 80.1 65.5 - 46.6 %   MCV 88 79 - 97 fL   MCH 29.0 26.6 - 33.0 pg   MCHC 33.0 31 - 35 g/dL   RDW 37.4 (H) 82.7 - 07.8 %   Platelets 263 150 - 450 x10E3/uL   Neutrophils 53 Not Estab. %   Lymphs 29 Not Estab. %   Monocytes 10 Not Estab. %   Eos 6 Not Estab. %   Basos 2 Not Estab. %   Neutrophils Absolute 3.6 1 - 7 x10E3/uL   Lymphocytes Absolute 2.0 0 - 3 x10E3/uL   Monocytes Absolute 0.7 0 - 0 x10E3/uL   EOS (ABSOLUTE) 0.4 0.0 - 0.4 x10E3/uL   Basophils Absolute 0.1 0 - 0 x10E3/uL   Immature Granulocytes 0 Not Estab. %   Immature Grans (Abs) 0.0 0.0 - 0.1 x10E3/uL  Iron and TIBC  Result Value Ref Range   Total Iron Binding Capacity 348 250 - 450 ug/dL   UIBC 675 449 - 201 ug/dL   Iron 82 27 - 007 ug/dL   Iron Saturation 24 15 - 55 %  Ferritin  Result Value Ref Range   Ferritin 164 (H) 15.0 - 150.0 ng/mL  Basic metabolic panel  Result Value Ref Range   Glucose 109 (H) 65 - 99 mg/dL   BUN 17 8 - 27 mg/dL   Creatinine, Ser 1.21 0.57 - 1.00 mg/dL   GFR calc non Af Amer 70 >59 mL/min/1.73   GFR calc Af Amer 81 >59 mL/min/1.73   BUN/Creatinine Ratio 19 12 - 28   Sodium 140 134 - 144 mmol/L   Potassium 4.4 3.5 - 5.2 mmol/L   Chloride 104 96 - 106 mmol/L   CO2 22 20 - 29 mmol/L   Calcium 9.8 8.7 - 10.3 mg/dL      Assessment & Plan:   Problem List Items Addressed This Visit      Cardiovascular and Mediastinum   Hypertension - Primary    Still running a bit high, patient refuses to increase medicine  right now. Monitor at  home. DASH diet. Recheck 3 months.         Other   Insomnia    Under good control on current regimen. Continue current regimen. Continue to monitor. Call with any concerns. Refills given for 3 months. Follow up 3 months.         Wheezing    Will continue inhalers and call if not getting better or getting worse. Continue to monitor.       Other Visit Diagnoses    Flu vaccine need       Flu shot given today.   Relevant Orders   Flu Vaccine QUAD 36+ mos IM (Completed)       Follow up plan: Return in about 3 months (around 02/08/2020).

## 2019-11-09 NOTE — Assessment & Plan Note (Signed)
Still running a bit high, patient refuses to increase medicine right now. Monitor at home. DASH diet. Recheck 3 months.

## 2019-11-14 ENCOUNTER — Other Ambulatory Visit: Payer: Self-pay | Admitting: Family Medicine

## 2019-11-16 ENCOUNTER — Ambulatory Visit: Payer: PRIVATE HEALTH INSURANCE | Admitting: Family Medicine

## 2020-02-08 ENCOUNTER — Ambulatory Visit (INDEPENDENT_AMBULATORY_CARE_PROVIDER_SITE_OTHER): Payer: PRIVATE HEALTH INSURANCE | Admitting: Family Medicine

## 2020-02-08 ENCOUNTER — Encounter: Payer: Self-pay | Admitting: Family Medicine

## 2020-02-08 ENCOUNTER — Other Ambulatory Visit: Payer: Self-pay

## 2020-02-08 VITALS — BP 142/98 | HR 91 | Temp 98.4°F | Wt 138.8 lb

## 2020-02-08 DIAGNOSIS — Z20822 Contact with and (suspected) exposure to covid-19: Secondary | ICD-10-CM

## 2020-02-08 DIAGNOSIS — J0100 Acute maxillary sinusitis, unspecified: Secondary | ICD-10-CM | POA: Diagnosis not present

## 2020-02-08 DIAGNOSIS — F5101 Primary insomnia: Secondary | ICD-10-CM | POA: Diagnosis not present

## 2020-02-08 MED ORDER — PREDNISONE 50 MG PO TABS: 50.0000 mg | ORAL_TABLET | Freq: Every day | ORAL | 0 refills | Status: DC

## 2020-02-08 MED ORDER — AZITHROMYCIN 250 MG PO TABS: ORAL_TABLET | ORAL | 0 refills | Status: DC

## 2020-02-08 MED ORDER — ESZOPICLONE 3 MG PO TABS: ORAL_TABLET | ORAL | 2 refills | Status: DC

## 2020-02-08 NOTE — Assessment & Plan Note (Signed)
Under good control on current regimen. Continue current regimen. Continue to monitor. Call with any concerns. Refills given for 3 months. Follow up 3 months.    

## 2020-02-08 NOTE — Progress Notes (Signed)
BP (!) 142/98   Pulse 91   Temp 98.4 F (36.9 C)   Wt 138 lb 12.8 oz (63 kg)   LMP  (LMP Unknown)   SpO2 99%   BMI 24.29 kg/m    Subjective:    Patient ID: Jasmine Camacho, female    DOB: January 23, 1956, 64 y.o.   MRN: [id number]  HPI: Jasmine Camacho is a 64 y.o. female  Chief Complaint  Patient presents with  . Insomnia  . Sinus Problem    Pt has cough, runny nose, sore throat, body aches    INSOMNIA Duration: chronic Satisfied with sleep quality: yes Difficulty falling asleep: no Difficulty staying asleep: no Waking a few hours after sleep onset: no Early morning awakenings: no Daytime hypersomnolence: no Wakes feeling refreshed: yes Good sleep hygiene: no Apnea: no Snoring: no Depressed/anxious mood: no Recent stress: no Restless legs/nocturnal leg cramps: no Chronic pain/arthritis: no History of sleep study: yes Treatments attempted: lunesta, melatonin, uinsom, benadryl and ambien   UPPER RESPIRATORY TRACT INFECTION Duration: about 2 weeks Worst symptom: Congestion Fever: no Cough: yes Shortness of breath: yes Wheezing: yes Chest pain: no Chest tightness: no Chest congestion: yes Nasal congestion: yes Runny nose: yes Post nasal drip: yes Sneezing: no Sore throat: yes Swollen glands: no Sinus pressure: yes Headache: no Face pain: no Toothache: no Ear pain: no  Ear pressure: no  Eyes red/itching:no Eye drainage/crusting: yes  Vomiting: no Rash: no Fatigue: yes Sick contacts: no Strep contacts: no  Context: worse Recurrent sinusitis: no Relief with OTC cold/cough medications: no  Treatments attempted: nothing   Relevant past medical, surgical, family and social history reviewed and updated as indicated. Interim medical history since our last visit reviewed. Allergies and medications reviewed and updated.  Review of Systems  Constitutional: Negative.   HENT: Positive for congestion, postnasal drip, rhinorrhea, sinus pressure, sinus pain,  sneezing and sore throat. Negative for dental problem, drooling, ear discharge, ear pain, facial swelling, hearing loss, mouth sores, nosebleeds, tinnitus, trouble swallowing and voice change.   Respiratory: Positive for cough and shortness of breath. Negative for apnea, choking, chest tightness, wheezing and stridor.   Cardiovascular: Negative.   Gastrointestinal: Negative.   Musculoskeletal: Negative.   Neurological: Negative.   Psychiatric/Behavioral: Negative.     Per HPI unless specifically indicated above     Objective:    BP (!) 142/98   Pulse 91   Temp 98.4 F (36.9 C)   Wt 138 lb 12.8 oz (63 kg)   LMP  (LMP Unknown)   SpO2 99%   BMI 24.29 kg/m   Wt Readings from Last 3 Encounters:  02/08/20 138 lb 12.8 oz (63 kg)  11/09/19 141 lb (64 kg)  09/08/19 135 lb (61.2 kg)    Physical Exam Vitals and nursing note reviewed.  Constitutional:      General: She is not in acute distress.    Appearance: Normal appearance. She is not ill-appearing, toxic-appearing or diaphoretic.  HENT:     Head: Normocephalic and atraumatic.     Right Ear: Tympanic membrane, ear canal and external ear normal.     Left Ear: Tympanic membrane, ear canal and external ear normal.     Nose: Nose normal. No congestion or rhinorrhea.     Mouth/Throat:     Mouth: Mucous membranes are moist.     Pharynx: Oropharynx is clear. No oropharyngeal exudate or posterior oropharyngeal erythema.  Eyes:     General: No scleral icterus.  Right eye: No discharge.        Left eye: No discharge.     Extraocular Movements: Extraocular movements intact.     Conjunctiva/sclera: Conjunctivae normal.     Pupils: Pupils are equal, round, and reactive to light.  Cardiovascular:     Rate and Rhythm: Normal rate and regular rhythm.     Pulses: Normal pulses.     Heart sounds: Normal heart sounds. No murmur heard. No friction rub. No gallop.   Pulmonary:     Effort: Pulmonary effort is normal. No respiratory  distress.     Breath sounds: Normal breath sounds. No stridor. No wheezing, rhonchi or rales.  Chest:     Chest wall: No tenderness.  Musculoskeletal:        General: Normal range of motion.     Cervical back: Normal range of motion and neck supple.  Skin:    General: Skin is warm and dry.     Capillary Refill: Capillary refill takes less than 2 seconds.     Coloration: Skin is not jaundiced or pale.     Findings: No bruising, erythema, lesion or rash.  Neurological:     General: No focal deficit present.     Mental Status: She is alert and oriented to person, place, and time. Mental status is at baseline.  Psychiatric:        Mood and Affect: Mood normal.        Behavior: Behavior normal.        Thought Content: Thought content normal.        Judgment: Judgment normal.     Results for orders placed or performed in visit on 08/21/19  CBC with Differential/Platelet  Result Value Ref Range   WBC 6.9 3.4 - 10.8 x10E3/uL   RBC 3.97 3.77 - 5.28 x10E6/uL   Hemoglobin 11.5 11.1 - 15.9 g/dL   Hematocrit 70.6 23.7 - 46.6 %   MCV 88 79 - 97 fL   MCH 29.0 26.6 - 33.0 pg   MCHC 33.0 31.5 - 35.7 g/dL   RDW 62.8 (H) 31.5 - 17.6 %   Platelets 263 150 - 450 x10E3/uL   Neutrophils 53 Not Estab. %   Lymphs 29 Not Estab. %   Monocytes 10 Not Estab. %   Eos 6 Not Estab. %   Basos 2 Not Estab. %   Neutrophils Absolute 3.6 1.4 - 7.0 x10E3/uL   Lymphocytes Absolute 2.0 0.7 - 3.1 x10E3/uL   Monocytes Absolute 0.7 0.1 - 0.9 x10E3/uL   EOS (ABSOLUTE) 0.4 0.0 - 0.4 x10E3/uL   Basophils Absolute 0.1 0.0 - 0.2 x10E3/uL   Immature Granulocytes 0 Not Estab. %   Immature Grans (Abs) 0.0 0.0 - 0.1 x10E3/uL  Iron and TIBC  Result Value Ref Range   Total Iron Binding Capacity 348 250 - 450 ug/dL   UIBC 160 737 - 106 ug/dL   Iron 82 27 - 269 ug/dL   Iron Saturation 24 15 - 55 %  Ferritin  Result Value Ref Range   Ferritin 164 (H) 15 - 150 ng/mL  Basic metabolic panel  Result Value Ref Range    Glucose 109 (H) 65 - 99 mg/dL   BUN 17 8 - 27 mg/dL   Creatinine, Ser 4.85 0.57 - 1.00 mg/dL   GFR calc non Af Amer 70 >59 mL/min/1.73   GFR calc Af Amer 81 >59 mL/min/1.73   BUN/Creatinine Ratio 19 12 - 28   Sodium 140 134 - 144  mmol/L   Potassium 4.4 3.5 - 5.2 mmol/L   Chloride 104 96 - 106 mmol/L   CO2 22 20 - 29 mmol/L   Calcium 9.8 8.7 - 10.3 mg/dL      Assessment & Plan:   Problem List Items Addressed This Visit      Other   Insomnia    Under good control on current regimen. Continue current regimen. Continue to monitor. Call with any concerns. Refills given for 3 months. Follow up 3 months.         Other Visit Diagnoses    Suspected COVID-19 virus infection    -  Primary   Will swab today. Self-quarantine until results are back. Continue to monitor. Await results.    Relevant Orders   Novel Coronavirus, NAA ([facility])   Acute non-recurrent maxillary sinusitis       Will treat with prednisone and azithromycin. Call if not getting better or getting worse. Continue to monitor.    Relevant Medications   azithromycin (ZITHROMAX) 250 MG tablet   predniSONE (DELTASONE) 50 MG tablet       Follow up plan: Return in about 3 months (around 05/08/2020).

## 2020-02-10 LAB — SARS-COV-2, NAA 2 DAY TAT

## 2020-02-10 LAB — NOVEL CORONAVIRUS, NAA: SARS-CoV-2, NAA: NOT DETECTED

## 2020-02-12 ENCOUNTER — Other Ambulatory Visit: Payer: Self-pay | Admitting: Family Medicine

## 2020-02-12 NOTE — Telephone Encounter (Signed)
Approved per protocol.  Requested Prescriptions  Pending Prescriptions Disp Refills  . losartan (COZAAR) 50 MG tablet [Pharmacy Med Name: LOSARTAN 50MG  TABLETS] 90 tablet 0    Sig: TAKE 1 TABLET(50 MG) BY MOUTH DAILY     Cardiovascular:  Angiotensin Receptor Blockers Failed - 02/12/2020 10:45 AM      Failed - Last BP in normal range    BP Readings from Last 1 Encounters:  02/08/20 (!) 142/98         Passed - Cr in normal range and within 180 days    Creatinine, Ser  Date Value Ref Range Status  08/21/2019 0.88 0.57 - 1.00 mg/dL Final         Passed - K in normal range and within 180 days    Potassium  Date Value Ref Range Status  08/21/2019 4.4 3.5 - 5.2 mmol/L Final         Passed - Patient is not pregnant      Passed - Valid encounter within last 6 months    Recent Outpatient Visits          4 days ago Suspected COVID-19 virus infection   [facility] [place], [place], DO   3 months ago Hypertension, unspecified type   [facility] [place], Megan P, DO   5 months ago Left upper quadrant pain   [facility] [place], NP   5 months ago Cardiomyopathy, unspecified type [facility])   [facility] [place], Megan P, DO   8 months ago Chronic systolic congestive heart failure [facility])   [facility] [place], [place], DO      Future Appointments            In 2 months Johnson, Oralia Rud, DO [facility], [facility]

## 2020-03-19 IMAGING — DX DG CHEST 1V PORT
1 series · 1 of 1 positions shown · non-contrast
Comparison: 07/12/2018

CLINICAL DATA: Shortness of breath

EXAM:
PORTABLE CHEST 1 VIEW

[chest ap]
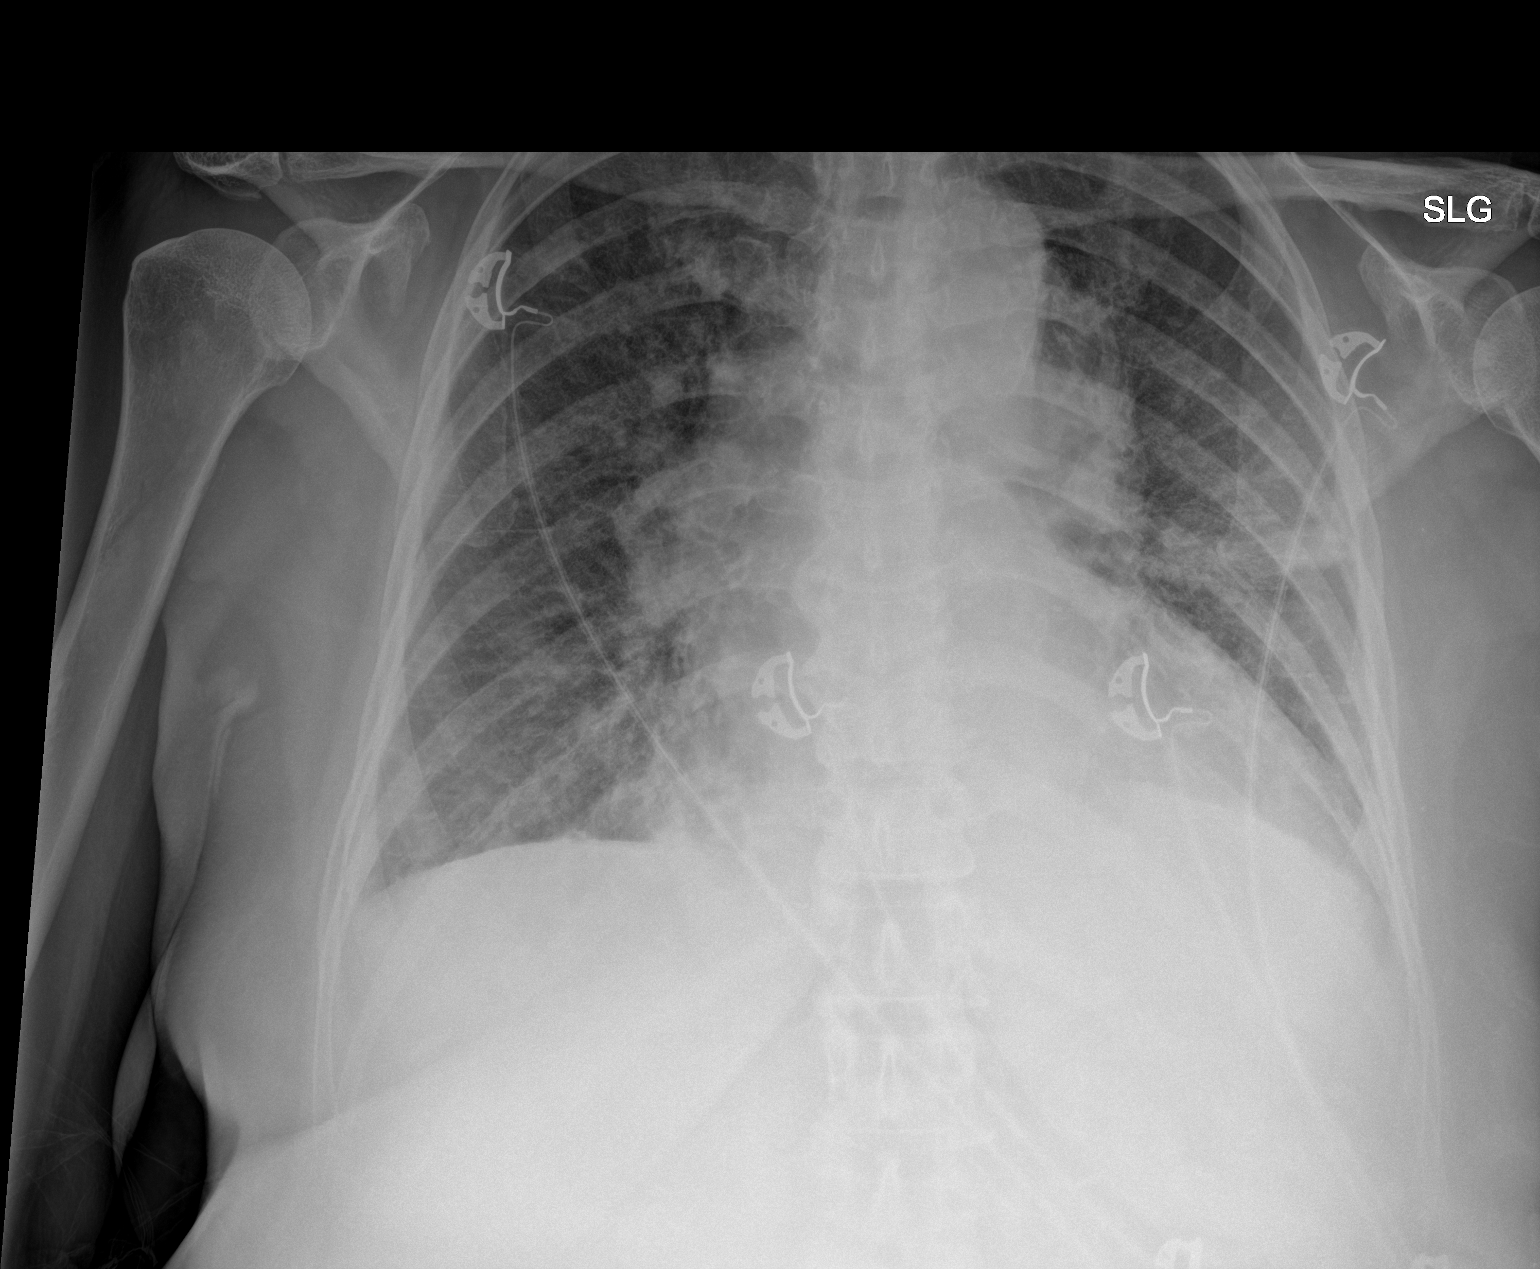

[1 of 1 positions shown; findings below may reference images not displayed]

FINDINGS: Cardiomegaly. There is subtle bilateral interstitial and
heterogeneous airspace opacity, most conspicuous in the left
midlung. There are probable small bilateral pleural effusions. The
visualized skeletal structures are unremarkable.
IMPRESSION: Cardiomegaly. There is subtle bilateral interstitial and
heterogeneous airspace opacity, most conspicuous in the left
midlung. There are probable small bilateral pleural effusions.
Findings are most consistent with edema although infection is a
differential consideration.

## 2020-03-20 ENCOUNTER — Other Ambulatory Visit: Payer: Self-pay | Admitting: Family Medicine

## 2020-05-07 ENCOUNTER — Telehealth: Payer: Self-pay | Admitting: Family Medicine

## 2020-05-08 NOTE — Telephone Encounter (Signed)
Pts husband calling in stating that the pt is not feeling well and cannot make appt tomorrow. Pt rescheduled appt for 05/17/20. Please advise.

## 2020-05-08 NOTE — Telephone Encounter (Signed)
Patient has an appointment tomorrow 

## 2020-05-09 ENCOUNTER — Ambulatory Visit: Payer: PRIVATE HEALTH INSURANCE | Admitting: Family Medicine

## 2020-05-10 MED ORDER — ESZOPICLONE 3 MG PO TABS: ORAL_TABLET | ORAL | 0 refills | Status: DC

## 2020-05-10 MED ORDER — LOSARTAN POTASSIUM 50 MG PO TABS: ORAL_TABLET | ORAL | 0 refills | Status: DC

## 2020-05-10 NOTE — Addendum Note (Signed)
Addended by: Dorcas Carrow on: 05/10/2020 10:53 AM   Modules accepted: Orders

## 2020-05-10 NOTE — Telephone Encounter (Signed)
Pts husband calling again regarding these medications. He states that the pt is completely out of both and is needing to have some pills until her upcoming appt on 05/17/20. He states that she was sick and was not able to come to her earlier appt and had to reschedule. Please advise.

## 2020-05-17 ENCOUNTER — Encounter: Payer: Self-pay | Admitting: Family Medicine

## 2020-05-17 ENCOUNTER — Other Ambulatory Visit: Payer: Self-pay

## 2020-05-17 ENCOUNTER — Ambulatory Visit (INDEPENDENT_AMBULATORY_CARE_PROVIDER_SITE_OTHER): Payer: 59 | Admitting: Family Medicine

## 2020-05-17 VITALS — BP 124/84 | HR 101 | Temp 98.2°F | Wt 136.8 lb

## 2020-05-17 DIAGNOSIS — F5101 Primary insomnia: Secondary | ICD-10-CM | POA: Diagnosis not present

## 2020-05-17 DIAGNOSIS — J449 Chronic obstructive pulmonary disease, unspecified: Secondary | ICD-10-CM

## 2020-05-17 DIAGNOSIS — I10 Essential (primary) hypertension: Secondary | ICD-10-CM | POA: Diagnosis not present

## 2020-05-17 DIAGNOSIS — I700 Atherosclerosis of aorta: Secondary | ICD-10-CM

## 2020-05-17 MED ORDER — ESZOPICLONE 3 MG PO TABS: ORAL_TABLET | ORAL | 2 refills | Status: DC

## 2020-05-17 MED ORDER — FLUTICASONE-SALMETEROL 100-50 MCG/DOSE IN AEPB: 1.0000 | INHALATION_SPRAY | Freq: Two times a day (BID) | RESPIRATORY_TRACT | 6 refills | Status: DC

## 2020-05-17 MED ORDER — ALBUTEROL SULFATE HFA 108 (90 BASE) MCG/ACT IN AERS: INHALATION_SPRAY | RESPIRATORY_TRACT | 6 refills | Status: DC

## 2020-05-17 MED ORDER — LOSARTAN POTASSIUM 50 MG PO TABS: ORAL_TABLET | ORAL | 1 refills | Status: DC

## 2020-05-17 MED ORDER — FUROSEMIDE 20 MG PO TABS: ORAL_TABLET | ORAL | 1 refills | Status: DC

## 2020-05-17 NOTE — Assessment & Plan Note (Signed)
Under good control on current regimen. Continue current regimen. Continue to monitor. Call with any concerns. Refills given.   

## 2020-05-17 NOTE — Progress Notes (Signed)
BP 124/84   Pulse (!) 101   Temp 98.2 F (36.8 C)   Wt 136 lb 12.8 oz (62.1 kg)   LMP  (LMP Unknown)   SpO2 98%   BMI 23.94 kg/m    Subjective:    Patient ID: Jasmine Camacho, female    DOB: 1955-03-26, 65 y.o.   MRN: [id number]  HPI: Jasmine Camacho is a 65 y.o. female  Chief Complaint  Patient presents with  . Hypertension  . Insomnia    Needs medication refill    COPD COPD status: stable Satisfied with current treatment?: yes Oxygen use: no Dyspnea frequency: rarely Cough frequency: rarely Rescue inhaler frequency: rarely   Limitation of activity: no Pneumovax: Up to Date Influenza: Up to Date  INSOMNIA Duration: chronic Satisfied with sleep quality: yes Difficulty falling asleep: no Difficulty staying asleep: no Waking a few hours after sleep onset: no Early morning awakenings: no Daytime hypersomnolence: no Wakes feeling refreshed: yes Good sleep hygiene: yes Apnea: no Snoring: no Depressed/anxious mood: no Recent stress: no Restless legs/nocturnal leg cramps: no Chronic pain/arthritis: no Treatments attempted: melatonin, uinsom, benadryl and ambien   HYPERTENSION Hypertension status: controlled  Satisfied with current treatment? yes Duration of hypertension: chronic BP monitoring frequency:  not checking BP medication side effects:  no Medication compliance: excellent compliance Aspirin: no Recurrent headaches: no Visual changes: no Palpitations: no Dyspnea: no Chest pain: no Lower extremity edema: no Dizzy/lightheaded: no  Relevant past medical, surgical, family and social history reviewed and updated as indicated. Interim medical history since our last visit reviewed. Allergies and medications reviewed and updated.  Review of Systems  Constitutional: Negative.   Respiratory: Negative.   Cardiovascular: Negative.   Gastrointestinal: Negative.   Musculoskeletal: Negative.   Neurological: Negative.   Psychiatric/Behavioral: Negative.      Per HPI unless specifically indicated above     Objective:    BP 124/84   Pulse (!) 101   Temp 98.2 F (36.8 C)   Wt 136 lb 12.8 oz (62.1 kg)   LMP  (LMP Unknown)   SpO2 98%   BMI 23.94 kg/m   Wt Readings from Last 3 Encounters:  05/17/20 136 lb 12.8 oz (62.1 kg)  02/08/20 138 lb 12.8 oz (63 kg)  11/09/19 141 lb (64 kg)    Physical Exam Vitals and nursing note reviewed.  Constitutional:      General: She is not in acute distress.    Appearance: Normal appearance. She is not ill-appearing, toxic-appearing or diaphoretic.  HENT:     Head: Normocephalic and atraumatic.     Right Ear: External ear normal.     Left Ear: External ear normal.     Nose: Nose normal.     Mouth/Throat:     Mouth: Mucous membranes are moist.     Pharynx: Oropharynx is clear.  Eyes:     General: No scleral icterus.       Right eye: No discharge.        Left eye: No discharge.     Extraocular Movements: Extraocular movements intact.     Conjunctiva/sclera: Conjunctivae normal.     Pupils: Pupils are equal, round, and reactive to light.  Cardiovascular:     Rate and Rhythm: Normal rate and regular rhythm.     Pulses: Normal pulses.     Heart sounds: Normal heart sounds. No murmur heard. No friction rub. No gallop.   Pulmonary:     Effort: Pulmonary effort is normal. No  respiratory distress.     Breath sounds: Normal breath sounds. No stridor. No wheezing, rhonchi or rales.  Chest:     Chest wall: No tenderness.  Musculoskeletal:        General: Normal range of motion.     Cervical back: Normal range of motion and neck supple.  Skin:    General: Skin is warm and dry.     Capillary Refill: Capillary refill takes less than 2 seconds.     Coloration: Skin is not jaundiced or pale.     Findings: No bruising, erythema, lesion or rash.  Neurological:     General: No focal deficit present.     Mental Status: She is alert and oriented to person, place, and time. Mental status is at baseline.   Psychiatric:        Mood and Affect: Mood normal.        Behavior: Behavior normal.        Thought Content: Thought content normal.        Judgment: Judgment normal.     Results for orders placed or performed in visit on 02/08/20  Novel Coronavirus, NAA ([facility])   Specimen: Nasopharyngeal(NP) swabs in vial transport medium  Result Value Ref Range   SARS-CoV-2, NAA Not Detected Not Detected  SARS-COV-2, NAA 2 DAY TAT  Result Value Ref Range   SARS-CoV-2, NAA 2 DAY TAT Performed       Assessment & Plan:   Problem List Items Addressed This Visit      Cardiovascular and Mediastinum   Hypertension - Primary    Under good control on current regimen. Continue current regimen. Continue to monitor. Call with any concerns. Refills given. Labs drawn today.       Relevant Medications   furosemide (LASIX) 20 MG tablet   losartan (COZAAR) 50 MG tablet   Other Relevant Orders   Basic metabolic panel   Microalbumin, Urine Waived   Basic metabolic panel   Aortic atherosclerosis (HCC)    Will keep BP and cholesterol under good control. Continue to monitor. Call with any concerns.       Relevant Medications   furosemide (LASIX) 20 MG tablet   losartan (COZAAR) 50 MG tablet     Respiratory   Chronic obstructive pulmonary disease (HCC)    Under good control on current regimen. Continue current regimen. Continue to monitor. Call with any concerns. Refills given.         Relevant Medications   Fluticasone-Salmeterol (ADVAIR) 100-50 MCG/DOSE AEPB   albuterol (VENTOLIN HFA) 108 (90 Base) MCG/ACT inhaler     Other   Insomnia    Under good control on current regimen. Continue current regimen. Continue to monitor. Call with any concerns. Refills given for 3 months. Follow up 3 months.            Follow up plan: Return Before 08/16/20, for physical.

## 2020-05-17 NOTE — Assessment & Plan Note (Signed)
Will keep BP and cholesterol under good control. Continue to monitor. Call with any concerns.  

## 2020-05-17 NOTE — Assessment & Plan Note (Signed)
Under good control on current regimen. Continue current regimen. Continue to monitor. Call with any concerns. Refills given. Labs drawn today.   

## 2020-05-17 NOTE — Assessment & Plan Note (Signed)
Under good control on current regimen. Continue current regimen. Continue to monitor. Call with any concerns. Refills given for 3 months. Follow up 3 months.    

## 2020-05-18 LAB — BASIC METABOLIC PANEL
BUN/Creatinine Ratio: 19 (ref 12–28)
BUN: 15 mg/dL (ref 8–27)
CO2: 20 mmol/L (ref 20–29)
Calcium: 9.7 mg/dL (ref 8.7–10.3)
Chloride: 101 mmol/L (ref 96–106)
Creatinine, Ser: 0.81 mg/dL (ref 0.57–1.00)
Glucose: 87 mg/dL (ref 65–99)
Potassium: 3.8 mmol/L (ref 3.5–5.2)
Sodium: 140 mmol/L (ref 134–144)
eGFR: 81 mL/min/{1.73_m2} (ref >59)

## 2020-05-20 ENCOUNTER — Telehealth: Payer: Self-pay

## 2020-05-20 NOTE — Telephone Encounter (Signed)
Pt husband (DPR)  stated pharmacy stated the way the medication Eszopiclone Eszopiclone 3 MG TABS was written was incorrect that the office has to call to let them know to refill medication Pt completley out of the mediation that was given to last until apt which  was on 05/17/2020.Pt husband request a call to pharmacy to see what is going on as pt is out of mediation.

## 2020-05-20 NOTE — Telephone Encounter (Signed)
Called and spoke with patient's husband, informed him that a 10 day supply was given on 05/11/20 so she is not due until tomorrow.

## 2020-05-21 NOTE — Telephone Encounter (Signed)
Husband called back and stated the the medication situation was handled

## 2020-05-21 NOTE — Telephone Encounter (Signed)
Pt is out of sleep medication and the insurance will not pay for medication due to  The way rx was written 10 pills must last 30 days. Pt husband would like someone to call her [employer] to explain the issue

## 2020-06-27 ENCOUNTER — Other Ambulatory Visit: Payer: Self-pay

## 2020-06-27 DIAGNOSIS — Z1231 Encounter for screening mammogram for malignant neoplasm of breast: Secondary | ICD-10-CM

## 2020-07-08 ENCOUNTER — Telehealth: Payer: Self-pay | Admitting: Family Medicine

## 2020-07-09 NOTE — Telephone Encounter (Signed)
90 pills with refill called in April- should not be due

## 2020-07-09 NOTE — Telephone Encounter (Signed)
Patients husband called in to see about getting wife medication refilled, stated pharmacy says it has not been approved by PCP, and to contact the office. Pt states wife just has a couple left.

## 2020-07-10 NOTE — Telephone Encounter (Signed)
Patient's husband notified  

## 2020-08-07 ENCOUNTER — Encounter: Payer: Self-pay | Admitting: Family Medicine

## 2020-08-07 ENCOUNTER — Ambulatory Visit (INDEPENDENT_AMBULATORY_CARE_PROVIDER_SITE_OTHER): Payer: 59 | Admitting: Family Medicine

## 2020-08-07 ENCOUNTER — Other Ambulatory Visit: Payer: Self-pay

## 2020-08-07 VITALS — BP 128/84 | HR 84 | Temp 98.1°F | Wt 137.0 lb

## 2020-08-07 DIAGNOSIS — F5101 Primary insomnia: Secondary | ICD-10-CM

## 2020-08-07 DIAGNOSIS — I10 Essential (primary) hypertension: Secondary | ICD-10-CM

## 2020-08-07 NOTE — Progress Notes (Signed)
BP 128/84   Pulse 84   Temp 98.1 F (36.7 C)   Wt 137 lb (62.1 kg)   LMP  (LMP Unknown)   SpO2 97%   BMI 23.97 kg/m    Subjective:    Patient ID: Jasmine Camacho, female    DOB: 03-21-1955, 65 y.o.   MRN: [id number]  HPI: PIERINA Camacho is a 65 y.o. female  Chief Complaint  Patient presents with   Hypertension   Insomnia   HYPERTENSION Hypertension status: controlled  Satisfied with current treatment? yes Duration of hypertension: chronic BP monitoring frequency:  not checking BP medication side effects:  no Medication compliance: excellent compliance Previous BP meds: losartan Aspirin: no Recurrent headaches: no Visual changes: no Palpitations: no Dyspnea: no Chest pain: no Lower extremity edema: no Dizzy/lightheaded: no  INSOMNIA Duration: chronic Satisfied with sleep quality: yes Difficulty falling asleep: no Difficulty staying asleep: no Waking a few hours after sleep onset: no Early morning awakenings: no Daytime hypersomnolence: no Wakes feeling refreshed: no Good sleep hygiene: no Apnea: no Snoring: no Depressed/anxious mood: no Recent stress: no Restless legs/nocturnal leg cramps: no Chronic pain/arthritis: no History of sleep study: no Treatments attempted: melatonin, uinsom, benadryl, and ambien   Relevant past medical, surgical, family and social history reviewed and updated as indicated. Interim medical history since our last visit reviewed. Allergies and medications reviewed and updated.  Review of Systems  Constitutional: Negative.   Respiratory: Negative.    Cardiovascular: Negative.   Gastrointestinal: Negative.   Musculoskeletal: Negative.   Neurological: Negative.   Psychiatric/Behavioral: Negative.     Per HPI unless specifically indicated above     Objective:    BP 128/84   Pulse 84   Temp 98.1 F (36.7 C)   Wt 137 lb (62.1 kg)   LMP  (LMP Unknown)   SpO2 97%   BMI 23.97 kg/m   Wt Readings from Last 3 Encounters:   08/07/20 137 lb (62.1 kg)  05/17/20 136 lb 12.8 oz (62.1 kg)  02/08/20 138 lb 12.8 oz (63 kg)    Physical Exam Vitals and nursing note reviewed.  Constitutional:      General: She is not in acute distress.    Appearance: Normal appearance. She is not ill-appearing, toxic-appearing or diaphoretic.  HENT:     Head: Normocephalic and atraumatic.     Right Ear: External ear normal.     Left Ear: External ear normal.     Nose: Nose normal.     Mouth/Throat:     Mouth: Mucous membranes are moist.     Pharynx: Oropharynx is clear.  Eyes:     General: No scleral icterus.       Right eye: No discharge.        Left eye: No discharge.     Extraocular Movements: Extraocular movements intact.     Conjunctiva/sclera: Conjunctivae normal.     Pupils: Pupils are equal, round, and reactive to light.  Cardiovascular:     Rate and Rhythm: Normal rate and regular rhythm.     Pulses: Normal pulses.     Heart sounds: Normal heart sounds. No murmur heard.   No friction rub. No gallop.  Pulmonary:     Effort: Pulmonary effort is normal. No respiratory distress.     Breath sounds: Normal breath sounds. No stridor. No wheezing, rhonchi or rales.  Chest:     Chest wall: No tenderness.  Musculoskeletal:        General: Normal  range of motion.     Cervical back: Normal range of motion and neck supple.  Skin:    General: Skin is warm and dry.     Capillary Refill: Capillary refill takes less than 2 seconds.     Coloration: Skin is not jaundiced or pale.     Findings: No bruising, erythema, lesion or rash.  Neurological:     General: No focal deficit present.     Mental Status: She is alert and oriented to person, place, and time. Mental status is at baseline.  Psychiatric:        Mood and Affect: Mood normal.        Behavior: Behavior normal.        Thought Content: Thought content normal.        Judgment: Judgment normal.    Results for orders placed or performed in visit on 65/79/03  Basic  metabolic panel  Result Value Ref Range   Glucose 87 65 - 99 mg/dL   BUN 15 8 - 27 mg/dL   Creatinine, Ser 0.81 0.57 - 1.00 mg/dL   eGFR 81 >59 mL/min/1.73   BUN/Creatinine Ratio 19 12 - 28   Sodium 140 134 - 144 mmol/L   Potassium 3.8 3.5 - 5.2 mmol/L   Chloride 101 96 - 106 mmol/L   CO2 20 20 - 29 mmol/L   Calcium 9.7 8.7 - 10.3 mg/dL      Assessment & Plan:   Problem List Items Addressed This Visit       Cardiovascular and Mediastinum   Hypertension    Under good control on current regimen. Continue current regimen. Continue to monitor. Call with any concerns. Refills up to date.           Other   Insomnia - Primary    Under good control on current regimen. Continue current regimen. Continue to monitor. Call with any concerns. Refills given for 3 months. Follow up 3 months           Follow up plan: Return in about 3 months (around 11/07/2020) for physical.

## 2020-08-08 ENCOUNTER — Encounter: Payer: Self-pay | Admitting: Family Medicine

## 2020-08-08 MED ORDER — MONTELUKAST SODIUM 10 MG PO TABS: ORAL_TABLET | ORAL | 3 refills | Status: DC

## 2020-08-08 MED ORDER — CETIRIZINE HCL 10 MG PO TABS: ORAL_TABLET | ORAL | 3 refills | Status: DC

## 2020-08-08 MED ORDER — ESZOPICLONE 3 MG PO TABS: ORAL_TABLET | ORAL | 2 refills | Status: DC

## 2020-08-08 NOTE — Assessment & Plan Note (Signed)
Under good control on current regimen. Continue current regimen. Continue to monitor. Call with any concerns. Refills up to date.   

## 2020-08-08 NOTE — Assessment & Plan Note (Signed)
Under good control on current regimen. Continue current regimen. Continue to monitor. Call with any concerns. Refills given for 3 months. Follow up 3 months.    

## 2020-09-08 ENCOUNTER — Other Ambulatory Visit: Payer: Self-pay | Admitting: Family Medicine

## 2020-09-08 NOTE — Telephone Encounter (Signed)
last RF 08/08/20 #90 3 RF

## 2020-10-13 ENCOUNTER — Other Ambulatory Visit: Payer: Self-pay | Admitting: Family Medicine

## 2020-10-13 NOTE — Telephone Encounter (Signed)
last RF 08/08/20 #90 3 RF 

## 2020-10-23 ENCOUNTER — Other Ambulatory Visit: Payer: Self-pay

## 2020-11-07 ENCOUNTER — Encounter: Payer: Self-pay | Admitting: Family Medicine

## 2020-11-07 ENCOUNTER — Other Ambulatory Visit: Payer: Self-pay

## 2020-11-07 ENCOUNTER — Ambulatory Visit (INDEPENDENT_AMBULATORY_CARE_PROVIDER_SITE_OTHER): Payer: 59 | Admitting: Family Medicine

## 2020-11-07 VITALS — BP 132/72 | HR 101 | Ht 63.0 in | Wt 138.0 lb

## 2020-11-07 DIAGNOSIS — Z23 Encounter for immunization: Secondary | ICD-10-CM

## 2020-11-07 DIAGNOSIS — I10 Essential (primary) hypertension: Secondary | ICD-10-CM | POA: Diagnosis not present

## 2020-11-07 DIAGNOSIS — I429 Cardiomyopathy, unspecified: Secondary | ICD-10-CM

## 2020-11-07 DIAGNOSIS — F5101 Primary insomnia: Secondary | ICD-10-CM | POA: Diagnosis not present

## 2020-11-07 MED ORDER — MONTELUKAST SODIUM 10 MG PO TABS: ORAL_TABLET | ORAL | 3 refills | Status: DC

## 2020-11-07 MED ORDER — LOSARTAN POTASSIUM 50 MG PO TABS: ORAL_TABLET | ORAL | 1 refills | Status: DC

## 2020-11-07 MED ORDER — FLUTICASONE-SALMETEROL 100-50 MCG/ACT IN AEPB: 1.0000 | INHALATION_SPRAY | Freq: Two times a day (BID) | RESPIRATORY_TRACT | 3 refills | Status: DC

## 2020-11-07 MED ORDER — ESZOPICLONE 3 MG PO TABS: ORAL_TABLET | ORAL | 2 refills | Status: DC

## 2020-11-07 MED ORDER — FUROSEMIDE 20 MG PO TABS: ORAL_TABLET | ORAL | 1 refills | Status: DC

## 2020-11-07 NOTE — Assessment & Plan Note (Signed)
Under good control on current regimen. Continue current regimen. Continue to monitor. Call with any concerns. Refills given.   

## 2020-11-07 NOTE — Assessment & Plan Note (Signed)
Under good control on current regimen. Continue current regimen. Continue to monitor. Call with any concerns. Refills given for 3 months. Follow up 3 months.    

## 2020-11-07 NOTE — Assessment & Plan Note (Signed)
Stable. Follow with cardiology. Call with any concerns.

## 2020-11-07 NOTE — Telephone Encounter (Signed)
Copied from CRM [id number]. Topic: General - Other >> Nov 07, 2020  9:57 AM Aretta Nip wrote: Reason for CRM: Pt just in the office and scripts written incorrectly. Pt has called back after viewing after summary visit from this am visit. Pt has always gotten scripts from [id number] - [place], [place] - [address] AT [facility] OF [address] [facility] [address] [place] [address] Phone: [phone] Fax: [phone] Pt states that there was problems on computer when doing scripts. All scripts for today were done for [facility]) [facility] - [place], [place] - [address] [place] [address] Phone: [phone] Fax: [phone] Pt states that the meds for today need to be corrected and sent to [facility]. I have not removed as thought it would be easier to find out how error was made. Sent all scripts to [facility] always, and pt also ask to correct any other scripts that were affected by error. Pt was very nice but does want the pharmacies removed and the scripts resent.   Routing to provider. Can all scripts be sent to the local [facility] please?

## 2020-11-07 NOTE — Progress Notes (Signed)
BP 132/72   Pulse (!) 101   Ht 5' 3"  (1.6 m)   Wt 138 lb (62.6 kg)   LMP  (LMP Unknown)   BMI 24.45 kg/m    Subjective:    Patient ID: Jasmine Camacho, female    DOB: 13-Nov-1955, 65 y.o.   MRN: [id number]  HPI: Jasmine Camacho is a 65 y.o. female  Chief Complaint  Patient presents with   Hypertension   Insomnia   HYPERTENSION Hypertension status: better  Satisfied with current treatment? yes Duration of hypertension: chronic BP monitoring frequency:  rarely BP medication side effects:  no Medication compliance: excellent compliance Previous BP meds: losartan, lasix Aspirin: no Recurrent headaches: no Visual changes: no Palpitations: no Dyspnea: no Chest pain: no Lower extremity edema: no Dizzy/lightheaded: no  INSOMNIA Duration: chronic Satisfied with sleep quality: yes Difficulty falling asleep: no Difficulty staying asleep: no Waking a few hours after sleep onset: no Early morning awakenings: no Daytime hypersomnolence: no Wakes feeling refreshed: no Good sleep hygiene: no Apnea: no Snoring: no Depressed/anxious mood: no Recent stress: no Restless legs/nocturnal leg cramps: no Chronic pain/arthritis: no History of sleep study: yes Treatments attempted: melatonin, uinsom, benadryl, and ambien    Relevant past medical, surgical, family and social history reviewed and updated as indicated. Interim medical history since our last visit reviewed. Allergies and medications reviewed and updated.  Review of Systems  Constitutional: Negative.   Respiratory: Negative.    Cardiovascular: Negative.   Gastrointestinal: Negative.   Musculoskeletal: Negative.   Neurological: Negative.   Psychiatric/Behavioral: Negative.     Per HPI unless specifically indicated above     Objective:    BP 132/72   Pulse (!) 101   Ht 5' 3"  (1.6 m)   Wt 138 lb (62.6 kg)   LMP  (LMP Unknown)   BMI 24.45 kg/m   Wt Readings from Last 3 Encounters:  11/07/20 138 lb (62.6 kg)   08/07/20 137 lb (62.1 kg)  05/17/20 136 lb 12.8 oz (62.1 kg)    Physical Exam Vitals and nursing note reviewed.  Constitutional:      General: She is not in acute distress.    Appearance: Normal appearance. She is not ill-appearing, toxic-appearing or diaphoretic.  HENT:     Head: Normocephalic and atraumatic.     Right Ear: External ear normal.     Left Ear: External ear normal.     Nose: Nose normal.     Mouth/Throat:     Mouth: Mucous membranes are moist.     Pharynx: Oropharynx is clear.  Eyes:     General: No scleral icterus.       Right eye: No discharge.        Left eye: No discharge.     Extraocular Movements: Extraocular movements intact.     Conjunctiva/sclera: Conjunctivae normal.     Pupils: Pupils are equal, round, and reactive to light.  Cardiovascular:     Rate and Rhythm: Normal rate and regular rhythm.     Pulses: Normal pulses.     Heart sounds: Normal heart sounds. No murmur heard.   No friction rub. No gallop.  Pulmonary:     Effort: Pulmonary effort is normal. No respiratory distress.     Breath sounds: Normal breath sounds. No stridor. No wheezing, rhonchi or rales.  Chest:     Chest wall: No tenderness.  Musculoskeletal:        General: Normal range of motion.  Cervical back: Normal range of motion and neck supple.  Skin:    General: Skin is warm and dry.     Capillary Refill: Capillary refill takes less than 2 seconds.     Coloration: Skin is not jaundiced or pale.     Findings: No bruising, erythema, lesion or rash.  Neurological:     General: No focal deficit present.     Mental Status: She is alert and oriented to person, place, and time. Mental status is at baseline.  Psychiatric:        Mood and Affect: Mood normal.        Behavior: Behavior normal.        Thought Content: Thought content normal.        Judgment: Judgment normal.    Results for orders placed or performed in visit on 59/53/96  Basic metabolic panel  Result Value  Ref Range   Glucose 87 65 - 99 mg/dL   BUN 15 8 - 27 mg/dL   Creatinine, Ser 0.81 0.57 - 1.00 mg/dL   eGFR 81 >59 mL/min/1.73   BUN/Creatinine Ratio 19 12 - 28   Sodium 140 134 - 144 mmol/L   Potassium 3.8 3.5 - 5.2 mmol/L   Chloride 101 96 - 106 mmol/L   CO2 20 20 - 29 mmol/L   Calcium 9.7 8.7 - 10.3 mg/dL      Assessment & Plan:   Problem List Items Addressed This Visit       Cardiovascular and Mediastinum   Hypertension    Under good control on current regimen. Continue current regimen. Continue to monitor. Call with any concerns. Refills given.        Relevant Medications   losartan (COZAAR) 50 MG tablet   furosemide (LASIX) 20 MG tablet   Cardiomyopathy (HCC)    Stable. Follow with cardiology. Call with any concerns.       Relevant Medications   losartan (COZAAR) 50 MG tablet   furosemide (LASIX) 20 MG tablet     Other   Insomnia    Under good control on current regimen. Continue current regimen. Continue to monitor. Call with any concerns. Refills given for 3 months. Follow up 3 months.        Other Visit Diagnoses     Needs flu shot    -  Primary   Relevant Orders   Flu Vaccine QUAD High Dose(Fluad) (Completed)        Follow up plan: Return in about 3 months (around 02/06/2021).

## 2020-11-11 ENCOUNTER — Other Ambulatory Visit: Payer: Self-pay | Admitting: Family Medicine

## 2020-11-11 NOTE — Telephone Encounter (Signed)
Rx was sent over 9/29. Please confirm they have the Rx and if they do get it ready- if not let me know and I can send it again or you can call it in per Rx in chart.

## 2020-11-11 NOTE — Telephone Encounter (Signed)
Requested medication (s) are due for refill today:no  Requested medication (s) are on the active medication list:yes  Last refill: 11/07/20  #30  2 refills  Future visit scheduled yes 03/11/21  Notes to clinic: Not delegated, can not refuse.  Requested Prescriptions  Pending Prescriptions Disp Refills   Eszopiclone 3 MG TABS [Pharmacy Med Name: ESZOPICLONE 3MG  TABLETS] 30 tablet     Sig: TAKE 1 TABLET BY MOUTH IMMEDIATELY BEFORE BEDTIME. MUST LAST 30 DAYS     Not Delegated - Psychiatry:  Anxiolytics/Hypnotics Failed - 11/11/2020  7:52 AM      Failed - This refill cannot be delegated      Failed - Urine Drug Screen completed in last 360 days      Passed - Valid encounter within last 6 months    Recent Outpatient Visits           4 days ago Needs flu shot   [facility] [place], [place], DO   3 months ago Primary insomnia   [facility] [place], [place], DO   5 months ago Hypertension, unspecified type   [facility] Megan P, DO   9 months ago Suspected COVID-19 virus infection   [facility], [place], DO   1 year ago Hypertension, unspecified type   [facility] [place], [place], DO       Future Appointments             In 4 months Johnson, [place] yan, DO Oralia Rud, [facility]

## 2021-01-30 ENCOUNTER — Other Ambulatory Visit: Payer: Self-pay | Admitting: Family Medicine

## 2021-01-30 NOTE — Telephone Encounter (Signed)
Lmom asking pt to call back asap to schedule an appt

## 2021-01-30 NOTE — Telephone Encounter (Signed)
Refill not delegated. °

## 2021-01-30 NOTE — Telephone Encounter (Signed)
I can double book her as a virtual this PM if they can

## 2021-01-30 NOTE — Telephone Encounter (Signed)
Was supposed to be seen at end of Decemeber- appears to have appointment at end of January. Unclear why. I can do a virtual for her, but she needs an appointment for her medicine

## 2021-01-31 ENCOUNTER — Telehealth: Payer: Self-pay | Admitting: Family Medicine

## 2021-01-31 MED ORDER — ESZOPICLONE 3 MG PO TABS: ORAL_TABLET | ORAL | 0 refills | Status: DC

## 2021-01-31 NOTE — Telephone Encounter (Signed)
Medication sent to [place] due to power being out at [employer].

## 2021-01-31 NOTE — Telephone Encounter (Signed)
Medication was prescribed this morning by Dr. Laural Benes but the transmission to the pharmacy failed. Tried calling the pharmacy, no answer. Can we resend electronically please?

## 2021-01-31 NOTE — Telephone Encounter (Signed)
Pt is scheduled for 12/28

## 2021-01-31 NOTE — Telephone Encounter (Signed)
Copied from CRM [id number]. Topic: General - Other >> Jan 31, 2021  8:42 AM Gaetana Michaelis A wrote: Reason for CRM: The patient has had their med follow up scheduled for 02/05/21 at 10:40 AM  The patient's husband would like to know what will be done about the patient's prescription for Eszopiclone 3 MG TABS [[id number]]   The patient's husband would like to pick up the medication today before the holidays  Please contact the patient's husband further

## 2021-02-05 ENCOUNTER — Encounter: Payer: Self-pay | Admitting: Family Medicine

## 2021-02-05 ENCOUNTER — Telehealth (INDEPENDENT_AMBULATORY_CARE_PROVIDER_SITE_OTHER): Payer: 59 | Admitting: Family Medicine

## 2021-02-05 VITALS — BP 138/80

## 2021-02-05 DIAGNOSIS — F5101 Primary insomnia: Secondary | ICD-10-CM | POA: Diagnosis not present

## 2021-02-05 MED ORDER — ESZOPICLONE 3 MG PO TABS
ORAL_TABLET | ORAL | 2 refills | Status: DC
Start: 2021-02-05 — End: 2021-05-09

## 2021-02-05 NOTE — Progress Notes (Signed)
BP 138/80    LMP  (LMP Unknown)    Subjective:    Patient ID: Jasmine Camacho, female    DOB: 25-Jun-1955, 65 y.o.   MRN: [id number]  HPI: Jasmine Camacho is a 65 y.o. female  Chief Complaint  Patient presents with   Insomnia   INSOMNIA Duration: chronic Satisfied with sleep quality: yes Difficulty falling asleep: no Difficulty staying asleep: no Waking a few hours after sleep onset: no Early morning awakenings: no Daytime hypersomnolence: no Wakes feeling refreshed: yes Good sleep hygiene: yes Apnea: no Snoring: no Depressed/anxious mood: yes Recent stress: no Restless legs/nocturnal leg cramps: no Chronic pain/arthritis: no History of sleep study: yes Treatments attempted: melatonin, uinsom, benadryl, and ambien   Relevant past medical, surgical, family and social history reviewed and updated as indicated. Interim medical history since our last visit reviewed. Allergies and medications reviewed and updated.  Review of Systems  Constitutional: Negative.   Respiratory: Negative.    Cardiovascular: Negative.   Gastrointestinal: Negative.   Musculoskeletal: Negative.   Psychiatric/Behavioral: Negative.     Per HPI unless specifically indicated above     Objective:    BP 138/80    LMP  (LMP Unknown)   Wt Readings from Last 3 Encounters:  11/07/20 138 lb (62.6 kg)  08/07/20 137 lb (62.1 kg)  05/17/20 136 lb 12.8 oz (62.1 kg)    Physical Exam Vitals and nursing note reviewed.  Constitutional:      General: She is not in acute distress.    Appearance: Normal appearance. She is not ill-appearing, toxic-appearing or diaphoretic.  HENT:     Head: Normocephalic and atraumatic.     Right Ear: External ear normal.     Left Ear: External ear normal.     Nose: Nose normal.     Mouth/Throat:     Mouth: Mucous membranes are moist.     Pharynx: Oropharynx is clear.  Eyes:     General: No scleral icterus.       Right eye: No discharge.        Left eye: No discharge.      Extraocular Movements: Extraocular movements intact.     Conjunctiva/sclera: Conjunctivae normal.     Pupils: Pupils are equal, round, and reactive to light.  Cardiovascular:     Rate and Rhythm: Normal rate and regular rhythm.     Pulses: Normal pulses.     Heart sounds: Normal heart sounds. No murmur heard.   No friction rub. No gallop.  Pulmonary:     Effort: Pulmonary effort is normal. No respiratory distress.     Breath sounds: Normal breath sounds. No stridor. No wheezing, rhonchi or rales.  Chest:     Chest wall: No tenderness.  Musculoskeletal:        General: Normal range of motion.     Cervical back: Normal range of motion and neck supple.  Skin:    General: Skin is warm and dry.     Capillary Refill: Capillary refill takes less than 2 seconds.     Coloration: Skin is not jaundiced or pale.     Findings: No bruising, erythema, lesion or rash.  Neurological:     General: No focal deficit present.     Mental Status: She is alert and oriented to person, place, and time. Mental status is at baseline.  Psychiatric:        Mood and Affect: Mood normal.        Behavior: Behavior  normal.        Thought Content: Thought content normal.        Judgment: Judgment normal.    Results for orders placed or performed in visit on 89/16/94  Basic metabolic panel  Result Value Ref Range   Glucose 87 65 - 99 mg/dL   BUN 15 8 - 27 mg/dL   Creatinine, Ser 0.81 0.57 - 1.00 mg/dL   eGFR 81 >59 mL/min/1.73   BUN/Creatinine Ratio 19 12 - 28   Sodium 140 134 - 144 mmol/L   Potassium 3.8 3.5 - 5.2 mmol/L   Chloride 101 96 - 106 mmol/L   CO2 20 20 - 29 mmol/L   Calcium 9.7 8.7 - 10.3 mg/dL      Assessment & Plan:   Problem List Items Addressed This Visit       Other   Insomnia - Primary    Under good control on current regimen. Continue current regimen. Continue to monitor. Call with any concerns. Refills given for 3 months. Follow up 3 months.         Follow up  plan: Return 3 months. OK to cancel appt in january.   This visit was completed via telephone due to the restrictions of the COVID-19 pandemic. All issues as above were discussed and addressed but no physical exam was performed. If it was felt that the patient should be evaluated in the office, they were directed there. The patient verbally consented to this visit. Patient was unable to complete an audio/visual visit due to Lack of equipment. Due to the catastrophic nature of the COVID-19 pandemic, this visit was done through audio contact only. Location of the patient: home Location of the provider: home Those involved with this call:  Provider: Park Liter, DO CMA: Louanna Raw, CMA Front Desk/Registration: [employer]  Time spent on call:  15 minutes on the phone discussing health concerns. 23 minutes total spent in review of patient's record and preparation of their chart.

## 2021-02-05 NOTE — Assessment & Plan Note (Signed)
Under good control on current regimen. Continue current regimen. Continue to monitor. Call with any concerns. Refills given for 3 months. Follow up 3 months.    

## 2021-03-11 ENCOUNTER — Ambulatory Visit: Payer: 59 | Admitting: Family Medicine

## 2021-05-08 ENCOUNTER — Other Ambulatory Visit: Payer: Self-pay | Admitting: Family Medicine

## 2021-05-09 ENCOUNTER — Ambulatory Visit (INDEPENDENT_AMBULATORY_CARE_PROVIDER_SITE_OTHER): Payer: PPO | Admitting: Family Medicine

## 2021-05-09 ENCOUNTER — Encounter: Payer: Self-pay | Admitting: Family Medicine

## 2021-05-09 VITALS — BP 138/88 | HR 94 | Temp 98.3°F | Wt 126.2 lb

## 2021-05-09 DIAGNOSIS — D509 Iron deficiency anemia, unspecified: Secondary | ICD-10-CM

## 2021-05-09 DIAGNOSIS — I10 Essential (primary) hypertension: Secondary | ICD-10-CM

## 2021-05-09 DIAGNOSIS — F5101 Primary insomnia: Secondary | ICD-10-CM | POA: Diagnosis not present

## 2021-05-09 DIAGNOSIS — I5022 Chronic systolic (congestive) heart failure: Secondary | ICD-10-CM

## 2021-05-09 DIAGNOSIS — I700 Atherosclerosis of aorta: Secondary | ICD-10-CM | POA: Diagnosis not present

## 2021-05-09 DIAGNOSIS — I429 Cardiomyopathy, unspecified: Secondary | ICD-10-CM | POA: Diagnosis not present

## 2021-05-09 DIAGNOSIS — E0590 Thyrotoxicosis, unspecified without thyrotoxic crisis or storm: Secondary | ICD-10-CM

## 2021-05-09 DIAGNOSIS — J301 Allergic rhinitis due to pollen: Secondary | ICD-10-CM | POA: Diagnosis not present

## 2021-05-09 DIAGNOSIS — J449 Chronic obstructive pulmonary disease, unspecified: Secondary | ICD-10-CM | POA: Diagnosis not present

## 2021-05-09 MED ORDER — ALBUTEROL SULFATE HFA 108 (90 BASE) MCG/ACT IN AERS
INHALATION_SPRAY | RESPIRATORY_TRACT | 6 refills | Status: DC
Start: 2021-05-09 — End: 2022-04-24

## 2021-05-09 MED ORDER — ESZOPICLONE 3 MG PO TABS
ORAL_TABLET | ORAL | 2 refills | Status: DC
Start: 2021-05-09 — End: 2021-08-04

## 2021-05-09 MED ORDER — TRIAMCINOLONE ACETONIDE 40 MG/ML IJ SUSP
40.0000 mg | Freq: Once | INTRAMUSCULAR | Status: AC
  Administered 2021-05-09: 40 mg via INTRAMUSCULAR

## 2021-05-09 MED ORDER — OMEPRAZOLE 20 MG PO CPDR: 20.0000 mg | DELAYED_RELEASE_CAPSULE | Freq: Every day | ORAL | 3 refills | Status: DC

## 2021-05-09 MED ORDER — PREDNISONE 50 MG PO TABS: 50.0000 mg | ORAL_TABLET | Freq: Every day | ORAL | 0 refills | Status: DC

## 2021-05-09 MED ORDER — MONTELUKAST SODIUM 10 MG PO TABS
ORAL_TABLET | ORAL | 3 refills | Status: DC
Start: 2021-05-09 — End: 2022-04-24

## 2021-05-09 MED ORDER — FUROSEMIDE 20 MG PO TABS: ORAL_TABLET | ORAL | 1 refills | Status: DC

## 2021-05-09 MED ORDER — LOSARTAN POTASSIUM 50 MG PO TABS: ORAL_TABLET | ORAL | 1 refills | Status: DC

## 2021-05-09 NOTE — Assessment & Plan Note (Signed)
In exacerbation. Continue benadryl, xyzal and singulair. Will treat with prednisone. Call with any concerns.  ?

## 2021-05-09 NOTE — Assessment & Plan Note (Signed)
Better on recheck. Continue current regimen. Continue to monitor. Call with any concerns. Continue to monitor.  

## 2021-05-09 NOTE — Assessment & Plan Note (Signed)
Euvolemic. Continue to monitor. Call with any concerns. Refills given.  ?

## 2021-05-09 NOTE — Progress Notes (Signed)
? ?BP 138/88   Pulse 94   Temp 98.3 ?F (36.8 ?C)   Wt 126 lb 3.2 oz (57.2 kg)   LMP  (LMP Unknown)   SpO2 98%   BMI 22.36 kg/m?   ? ?Subjective:  ? ? Patient ID: Jasmine Camacho, female    DOB: 17-Sep-1955, 66 y.o.   MRN: [id number] ? ?HPI: ?[name] is a 66 y.o. female ? ?Chief Complaint  ?Patient presents with  ? Insomnia  ? Cough  ?  Patient states she started coughing, clear runny nose, coughing up mucus for about 3 weeks   ? Gastroesophageal Reflux  ?  Patient states she takes omeprazole otc and would like an rx for it   ? ?INSOMNIA ?Duration: chronic ?Satisfied with sleep quality: yes ?Difficulty falling asleep: no ?Difficulty staying asleep: no ?Waking a few hours after sleep onset: no ?Early morning awakenings: no ?Daytime hypersomnolence: no ?Wakes feeling refreshed: yes ?Good sleep hygiene: yes ?Apnea: no ?Snoring: no ?Depressed/anxious mood: no ?Recent stress: no ?Restless legs/nocturnal leg cramps: no ?Chronic pain/arthritis: no ?History of sleep study: yes ?Treatments attempted:  lunesta, melatonin, uinsom, benadryl, and ambien  ? ?HYPERTENSION ?Hypertension status: stable  ?Satisfied with current treatment? yes ?Duration of hypertension: chronic ?BP monitoring frequency:  not checking ?BP medication side effects:  no ?Medication compliance: excellent compliance ?Previous BP meds:losartan, lasix ?Aspirin: no ?Recurrent headaches: no ?Visual changes: no ?Palpitations: no ?Dyspnea: no ?Chest pain: no ?Lower extremity edema: no ?Dizzy/lightheaded: no ? ?UPPER RESPIRATORY TRACT INFECTION ?Duration: 3 weeks ?Worst symptom: cough ?Fever: chills and sweats ?Cough: yes ?Shortness of breath: yes ?Wheezing: yes ?Chest pain: no ?Chest tightness: no ?Chest congestion: no ?Nasal congestion: yes ?Runny nose: yes ?Post nasal drip: yes ?Sneezing: yes ?Sore throat: no ?Swollen glands: no ?Sinus pressure: no ?Headache: no ?Face pain: no ?Toothache: no ?Ear pain: no  ?Ear pressure: no  ?Eyes red/itching:no ?Eye  drainage/crusting: no  ?Vomiting: no ?Rash: no ?Fatigue: yes ?Sick contacts: no ?Strep contacts: no  ?Context: stable ?Recurrent sinusitis: no ?Relief with OTC cold/cough medications: no  ?Treatments attempted: anti-histamine  ? ? ?Relevant past medical, surgical, family and social history reviewed and updated as indicated. Interim medical history since our last visit reviewed. ?Allergies and medications reviewed and updated. ? ?Review of Systems  ?Constitutional:  Positive for fatigue and unexpected weight change. Negative for activity change, appetite change, chills, diaphoresis and fever.  ?HENT: Negative.    ?Respiratory:  Positive for cough, shortness of breath and wheezing. Negative for apnea, choking, chest tightness and stridor.   ?Cardiovascular: Negative.   ?Gastrointestinal: Negative.   ?Musculoskeletal: Negative.   ?Neurological: Negative.   ?Psychiatric/Behavioral:  Positive for sleep disturbance. Negative for agitation, behavioral problems, confusion, decreased concentration, dysphoric mood, hallucinations, self-injury and suicidal ideas. The patient is not nervous/anxious and is not hyperactive.   ? ?Per HPI unless specifically indicated above ? ?   ?Objective:  ?  ?BP 138/88   Pulse 94   Temp 98.3 ?F (36.8 ?C)   Wt 126 lb 3.2 oz (57.2 kg)   LMP  (LMP Unknown)   SpO2 98%   BMI 22.36 kg/m?   ?Wt Readings from Last 3 Encounters:  ?05/09/21 126 lb 3.2 oz (57.2 kg)  ?11/07/20 138 lb (62.6 kg)  ?08/07/20 137 lb (62.1 kg)  ?  ?Physical Exam ?Vitals and nursing note reviewed.  ?Constitutional:   ?   General: She is not in acute distress. ?   Appearance: Normal appearance. She is normal weight.  She is not ill-appearing, toxic-appearing or diaphoretic.  ?HENT:  ?   Head: Normocephalic and atraumatic.  ?   Right Ear: External ear normal. There is impacted cerumen.  ?   Left Ear: External ear normal. There is impacted cerumen.  ?   Nose: Nose normal.  ?   Mouth/Throat:  ?   Mouth: Mucous membranes are  moist.  ?   Pharynx: Oropharynx is clear. No oropharyngeal exudate or posterior oropharyngeal erythema.  ?Eyes:  ?   General: No scleral icterus.    ?   Right eye: No discharge.     ?   Left eye: No discharge.  ?   Extraocular Movements: Extraocular movements intact.  ?   Conjunctiva/sclera: Conjunctivae normal.  ?   Pupils: Pupils are equal, round, and reactive to light.  ?Cardiovascular:  ?   Rate and Rhythm: Normal rate and regular rhythm.  ?   Pulses: Normal pulses.  ?   Heart sounds: Normal heart sounds. No murmur heard. ?  No friction rub. No gallop.  ?Pulmonary:  ?   Effort: Pulmonary effort is normal. No respiratory distress.  ?   Breath sounds: Normal breath sounds. No stridor. No wheezing, rhonchi or rales.  ?Chest:  ?   Chest wall: No tenderness.  ?Musculoskeletal:     ?   General: Normal range of motion.  ?   Cervical back: Normal range of motion and neck supple.  ?Skin: ?   General: Skin is warm and dry.  ?   Capillary Refill: Capillary refill takes less than 2 seconds.  ?   Coloration: Skin is not jaundiced or pale.  ?   Findings: No bruising, erythema, lesion or rash.  ?Neurological:  ?   General: No focal deficit present.  ?   Mental Status: She is alert and oriented to person, place, and time. Mental status is at baseline.  ?Psychiatric:     ?   Mood and Affect: Mood normal.     ?   Behavior: Behavior normal.     ?   Thought Content: Thought content normal.     ?   Judgment: Judgment normal.  ? ? ?Results for orders placed or performed in visit on 05/17/20  ?Basic metabolic panel  ?Result Value Ref Range  ? Glucose 87 65 - 99 mg/dL  ? BUN 15 8 - 27 mg/dL  ? Creatinine, Ser 0.81 0.57 - 1.00 mg/dL  ? eGFR 81 >59 mL/min/1.73  ? BUN/Creatinine Ratio 19 12 - 28  ? Sodium 140 134 - 144 mmol/L  ? Potassium 3.8 3.5 - 5.2 mmol/L  ? Chloride 101 96 - 106 mmol/L  ? CO2 20 20 - 29 mmol/L  ? Calcium 9.7 8.7 - 10.3 mg/dL  ? ?   ?Assessment & Plan:  ? ?Problem List Items Addressed This Visit   ? ?  ?  Cardiovascular and Mediastinum  ? Hypertension - Primary  ?  Better on recheck. Continue current regimen. Continue to monitor. Call with any concerns. Continue to monitor.  ?  ?  ? Relevant Medications  ? losartan (COZAAR) 50 MG tablet  ? furosemide (LASIX) 20 MG tablet  ? Other Relevant Orders  ? Comprehensive metabolic panel  ? Urinalysis, Routine w reflex microscopic  ? Microalbumin, Urine Waived  ? CBC with Differential/Platelet  ? Cardiomyopathy ([place])  ?  Stable. Continue to follow with cardiology. Call with any concerns. Continue to monitor.  ?  ?  ? Relevant Medications  ?  losartan (COZAAR) 50 MG tablet  ? furosemide (LASIX) 20 MG tablet  ? Aortic atherosclerosis ([place])  ?  Will keep BP and cholesterol under good control. Continue to monitor. Call with any concerns.  ?  ?  ? Relevant Medications  ? losartan (COZAAR) 50 MG tablet  ? furosemide (LASIX) 20 MG tablet  ? Other Relevant Orders  ? Comprehensive metabolic panel  ? Lipid Panel w/o Chol/HDL Ratio  ? CBC with Differential/Platelet  ? Chronic systolic congestive heart failure ([place])  ?  Euvolemic. Continue to monitor. Call with any concerns. Refills given.  ?  ?  ? Relevant Medications  ? losartan (COZAAR) 50 MG tablet  ? furosemide (LASIX) 20 MG tablet  ? Other Relevant Orders  ? Comprehensive metabolic panel  ? CBC with Differential/Platelet  ?  ? Respiratory  ? Seasonal allergic rhinitis  ?  In exacerbation. Continue benadryl, xyzal and singulair. Will treat with prednisone. Call with any concerns.  ?  ?  ? Relevant Medications  ? triamcinolone acetonide (KENALOG-40) injection 40 mg  ? Chronic obstructive pulmonary disease (HCC)  ?  Under good control on current regimen. Continue current regimen. Continue to monitor. Call with any concerns. Refills given. Labs drawn today. ? ?  ?  ? Relevant Medications  ? albuterol (VENTOLIN HFA) 108 (90 Base) MCG/ACT inhaler  ? montelukast (SINGULAIR) 10 MG tablet  ? predniSONE (DELTASONE) 50 MG tablet  ?  triamcinolone acetonide (KENALOG-40) injection 40 mg  ? Other Relevant Orders  ? Comprehensive metabolic panel  ? CBC with Differential/Platelet  ?  ? Endocrine  ? Hyperthyroidism  ?  Continue to follow with endocrine. Rechecking labs today. Aw

## 2021-05-09 NOTE — Assessment & Plan Note (Signed)
Stable. Continue to follow with cardiology. Call with any concerns. Continue to monitor.  

## 2021-05-09 NOTE — Assessment & Plan Note (Signed)
Will keep BP and cholesterol under good control. Continue to monitor. Call with any concerns.  

## 2021-05-09 NOTE — Assessment & Plan Note (Signed)
Under good control on current regimen. Continue current regimen. Continue to monitor. Call with any concerns. Refills given for 3 months. Follow up 3 months.    

## 2021-05-09 NOTE — Assessment & Plan Note (Signed)
Under good control on current regimen. Continue current regimen. Continue to monitor. Call with any concerns. Refills given. Labs drawn today.   

## 2021-05-09 NOTE — Assessment & Plan Note (Signed)
Continue to follow with endocrine. Rechecking labs today. Await results. Treat as needed.  

## 2021-05-09 NOTE — Telephone Encounter (Signed)
Requested medication (s) are due for refill today no ? ?Requested medication (s) are on the active medication list -yes ? ?Future visit scheduled -yes ? ?Last refill: -today ? ?Notes to clinic: Request RF: Duplicate request, non delegated Rx- filled today at visit ? ?Requested Prescriptions  ?Pending Prescriptions Disp Refills  ? Eszopiclone 3 MG TABS [Pharmacy Med Name: ESZOPICLONE 3MG  TABLETS] 30 tablet   ?  Sig: TAKE 1 TABLET BY MOUTH IMMEDIATELY BEFORE BEDTIME. MUST LAST 30 DAYS  ?  ? Not Delegated - Psychiatry:  Anxiolytics/Hypnotics Failed - 05/08/2021  7:33 PM  ?  ?  Failed - This refill cannot be delegated  ?  ?  Failed - Urine Drug Screen completed in last 360 days  ?  ?  Passed - Valid encounter within last 6 months  ?  Recent Outpatient Visits   ? ?      ? Today Hypertension, unspecified type  ? [facility] [place], Megan P, DO  ? 3 months ago Primary insomnia  ? [facility] [place], Megan P, DO  ? 6 months ago Needs flu shot  ? [facility] [place], Megan P, DO  ? 9 months ago Primary insomnia  ? [facility] [place], Megan P, DO  ? 11 months ago Hypertension, unspecified type  ? [facility] [place], [place] P, DO  ? ?  ?  ?Future Appointments   ? ?        ? In 3 months [place], Laural Benes, DO [facility], [facility]  ? ?  ? ?  ?  ?  ? ? ? ?Requested Prescriptions  ?Pending Prescriptions Disp Refills  ? Eszopiclone 3 MG TABS [Pharmacy Med Name: ESZOPICLONE 3MG  TABLETS] 30 tablet   ?  Sig: TAKE 1 TABLET BY MOUTH IMMEDIATELY BEFORE BEDTIME. MUST LAST 30 DAYS  ?  ? Not Delegated - Psychiatry:  Anxiolytics/Hypnotics Failed - 05/08/2021  7:33 PM  ?  ?  Failed - This refill cannot be delegated  ?  ?  Failed - Urine Drug Screen completed in last 360 days  ?  ?  Passed - Valid encounter within last 6 months  ?  Recent Outpatient Visits   ? ?      ? Today Hypertension, unspecified type  ? [facility] [place], Megan P, DO  ? 3 months ago  Primary insomnia  ? [facility] [place], Megan P, DO  ? 6 months ago Needs flu shot  ? [facility] [place], Megan P, DO  ? 9 months ago Primary insomnia  ? [facility] [place], Megan P, DO  ? 11 months ago Hypertension, unspecified type  ? [facility] [place], [facility] P, DO  ? ?  ?  ?Future Appointments   ? ?        ? In 3 months [place], [place], DO [facility], [facility]  ? ?  ? ?  ?  ?  ? ? ? ?

## 2021-05-09 NOTE — Assessment & Plan Note (Signed)
Rechecking labs today. Await results. Treat as needed.  °

## 2021-05-10 LAB — CBC WITH DIFFERENTIAL/PLATELET
Basophils Absolute: 0.1 10*3/uL (ref 0.0–0.2)
Basos: 1 %
EOS (ABSOLUTE): 0.4 10*3/uL (ref 0.0–0.4)
Eos: 6 %
Hematocrit: 38.4 % (ref 34.0–46.6)
Hemoglobin: 12.6 g/dL (ref 11.1–15.9)
Immature Grans (Abs): 0 10*3/uL (ref 0.0–0.1)
Immature Granulocytes: 0 %
Lymphocytes Absolute: 1.2 10*3/uL (ref 0.7–3.1)
Lymphs: 19 %
MCH: 29.2 pg (ref 26.6–33.0)
MCHC: 32.8 g/dL (ref 31.5–35.7)
MCV: 89 fL (ref 79–97)
Monocytes Absolute: 0.7 10*3/uL (ref 0.1–0.9)
Monocytes: 10 %
Neutrophils Absolute: 4.2 10*3/uL (ref 1.4–7.0)
Neutrophils: 64 %
Platelets: 278 10*3/uL (ref 150–450)
RBC: 4.31 x10E6/uL (ref 3.77–5.28)
RDW: 15.3 % (ref 11.7–15.4)
WBC: 6.5 10*3/uL (ref 3.4–10.8)

## 2021-05-10 LAB — LIPID PANEL W/O CHOL/HDL RATIO
Cholesterol, Total: 159 mg/dL (ref 100–199)
HDL: 43 mg/dL (ref >39)
LDL Chol Calc (NIH): 101 mg/dL — ABNORMAL HIGH (ref 0–99)
Triglycerides: 78 mg/dL (ref 0–149)
VLDL Cholesterol Cal: 15 mg/dL (ref 5–40)

## 2021-05-10 LAB — COMPREHENSIVE METABOLIC PANEL
ALT: 18 IU/L (ref 0–32)
AST: 16 IU/L (ref 0–40)
Albumin/Globulin Ratio: 1.6 (ref 1.2–2.2)
Albumin: 4.2 g/dL (ref 3.8–4.8)
Alkaline Phosphatase: 130 IU/L — ABNORMAL HIGH (ref 44–121)
BUN/Creatinine Ratio: 17 (ref 12–28)
BUN: 13 mg/dL (ref 8–27)
Bilirubin Total: 0.4 mg/dL (ref 0.0–1.2)
CO2: 22 mmol/L (ref 20–29)
Calcium: 9.2 mg/dL (ref 8.7–10.3)
Chloride: 99 mmol/L (ref 96–106)
Creatinine, Ser: 0.78 mg/dL (ref 0.57–1.00)
Globulin, Total: 2.7 g/dL (ref 1.5–4.5)
Glucose: 77 mg/dL (ref 70–99)
Potassium: 3.5 mmol/L (ref 3.5–5.2)
Sodium: 138 mmol/L (ref 134–144)
Total Protein: 6.9 g/dL (ref 6.0–8.5)
eGFR: 84 mL/min/{1.73_m2} (ref >59)

## 2021-05-10 LAB — FERRITIN: Ferritin: 156 ng/mL — ABNORMAL HIGH (ref 15–150)

## 2021-05-10 LAB — IRON AND TIBC
Iron Saturation: 15 % (ref 15–55)
Iron: 50 ug/dL (ref 27–139)
Total Iron Binding Capacity: 343 ug/dL (ref 250–450)
UIBC: 293 ug/dL (ref 118–369)

## 2021-05-10 LAB — TSH: TSH: 1.86 u[IU]/mL (ref 0.450–4.500)

## 2021-05-27 DIAGNOSIS — E0500 Thyrotoxicosis with diffuse goiter without thyrotoxic crisis or storm: Secondary | ICD-10-CM | POA: Diagnosis not present

## 2021-06-04 DIAGNOSIS — Z1231 Encounter for screening mammogram for malignant neoplasm of breast: Secondary | ICD-10-CM | POA: Diagnosis not present

## 2021-06-04 DIAGNOSIS — E0500 Thyrotoxicosis with diffuse goiter without thyrotoxic crisis or storm: Secondary | ICD-10-CM | POA: Diagnosis not present

## 2021-06-04 DIAGNOSIS — I10 Essential (primary) hypertension: Secondary | ICD-10-CM | POA: Diagnosis not present

## 2021-06-04 DIAGNOSIS — Z122 Encounter for screening for malignant neoplasm of respiratory organs: Secondary | ICD-10-CM | POA: Diagnosis not present

## 2021-06-04 DIAGNOSIS — F17200 Nicotine dependence, unspecified, uncomplicated: Secondary | ICD-10-CM | POA: Diagnosis not present

## 2021-06-04 DIAGNOSIS — F17210 Nicotine dependence, cigarettes, uncomplicated: Secondary | ICD-10-CM | POA: Diagnosis not present

## 2021-06-04 DIAGNOSIS — R634 Abnormal weight loss: Secondary | ICD-10-CM | POA: Diagnosis not present

## 2021-06-05 ENCOUNTER — Other Ambulatory Visit: Payer: Self-pay | Admitting: Internal Medicine

## 2021-06-05 DIAGNOSIS — Z1231 Encounter for screening mammogram for malignant neoplasm of breast: Secondary | ICD-10-CM

## 2021-06-18 DIAGNOSIS — I208 Other forms of angina pectoris: Secondary | ICD-10-CM | POA: Diagnosis not present

## 2021-06-18 DIAGNOSIS — R0602 Shortness of breath: Secondary | ICD-10-CM | POA: Diagnosis not present

## 2021-06-18 DIAGNOSIS — I429 Cardiomyopathy, unspecified: Secondary | ICD-10-CM | POA: Diagnosis not present

## 2021-07-04 DIAGNOSIS — I429 Cardiomyopathy, unspecified: Secondary | ICD-10-CM | POA: Diagnosis not present

## 2021-07-04 DIAGNOSIS — I208 Other forms of angina pectoris: Secondary | ICD-10-CM | POA: Diagnosis not present

## 2021-07-04 DIAGNOSIS — R0602 Shortness of breath: Secondary | ICD-10-CM | POA: Diagnosis not present

## 2021-07-05 ENCOUNTER — Other Ambulatory Visit: Payer: Self-pay | Admitting: Family Medicine

## 2021-07-08 DIAGNOSIS — R002 Palpitations: Secondary | ICD-10-CM | POA: Diagnosis not present

## 2021-07-08 NOTE — Telephone Encounter (Signed)
Rx 04/11/21 #90 1RF- too soon Requested Prescriptions  Pending Prescriptions Disp Refills  . losartan (COZAAR) 50 MG tablet [Pharmacy Med Name: LOSARTAN 50MG  TABLETS] 90 tablet 1    Sig: TAKE 1 TABLET(50 MG) BY MOUTH DAILY     Cardiovascular:  Angiotensin Receptor Blockers Passed - 07/05/2021  4:16 PM      Passed - Cr in normal range and within 180 days    Creatinine, Ser  Date Value Ref Range Status  05/09/2021 0.78 0.57 - 1.00 mg/dL Final         Passed - K in normal range and within 180 days    Potassium  Date Value Ref Range Status  05/09/2021 3.5 3.5 - 5.2 mmol/L Final         Passed - Patient is not pregnant      Passed - Last BP in normal range    BP Readings from Last 1 Encounters:  05/09/21 138/88         Passed - Valid encounter within last 6 months    Recent Outpatient Visits          2 months ago Hypertension, unspecified type   [facility] [place], Megan P, DO   5 months ago Primary insomnia   [facility] [place], [place], DO   8 months ago Needs flu shot   [place], [place], DO   11 months ago Primary insomnia   [place], [place], DO   1 year ago Hypertension, unspecified type   [facility] [place], [place], DO      Future Appointments            In 1 month [place], [place], DO [facility]ia Rud, [facility]

## 2021-07-09 DIAGNOSIS — I714 Abdominal aortic aneurysm, without rupture, unspecified: Secondary | ICD-10-CM | POA: Insufficient documentation

## 2021-07-10 ENCOUNTER — Encounter (INDEPENDENT_AMBULATORY_CARE_PROVIDER_SITE_OTHER): Payer: Self-pay | Admitting: Vascular Surgery

## 2021-07-10 ENCOUNTER — Ambulatory Visit (INDEPENDENT_AMBULATORY_CARE_PROVIDER_SITE_OTHER): Payer: PPO | Admitting: Vascular Surgery

## 2021-07-10 VITALS — BP 124/86 | HR 91 | Resp 16 | Ht 64.0 in | Wt 127.0 lb

## 2021-07-10 DIAGNOSIS — I10 Essential (primary) hypertension: Secondary | ICD-10-CM | POA: Diagnosis not present

## 2021-07-10 DIAGNOSIS — J449 Chronic obstructive pulmonary disease, unspecified: Secondary | ICD-10-CM | POA: Diagnosis not present

## 2021-07-10 DIAGNOSIS — I7143 Infrarenal abdominal aortic aneurysm, without rupture: Secondary | ICD-10-CM | POA: Diagnosis not present

## 2021-07-10 DIAGNOSIS — I700 Atherosclerosis of aorta: Secondary | ICD-10-CM | POA: Diagnosis not present

## 2021-07-10 DIAGNOSIS — I7123 Aneurysm of the descending thoracic aorta, without rupture: Secondary | ICD-10-CM

## 2021-07-10 NOTE — Progress Notes (Signed)
MRN : [id number]  Jasmine Camacho is a 66 y.o. (09-Nov-1955) female who presents with chief complaint of check circulation.  History of Present Illness:   The patient presents to the office for evaluation of an ascending thoracic aortic aneurysm. The aneurysm was found incidentally by CT scan in 11/2018 during a hospitalization for pneumonia.   Recently she had a cardiac echo which noted a 5.85 cm aneurysm  Patient denies chest pain or unusual back pain, no other chest or abdominal complaints.  No history of an abrupt onset of a painful toe associated with blue discoloration.     No family history of TAA/AAA.   Patient denies amaurosis fugax or TIA symptoms.  There is no history of claudication or rest pain symptoms of the lower extremities.   The patient denies angina or shortness of breath.   No outpatient medications have been marked as taking for the 07/10/21 encounter (Appointment) with Delana Meyer, Dolores Lory, MD.    Past Medical History:  Diagnosis Date   Acute respiratory failure ([place]) 11/17/2018   Aphasia    Hypertension    Insomnia    Menopausal symptom    Menorrhagia    Pneumonia    Seasonal allergies    Tachycardia    Thyroid disease    Ventricular premature contractions     Past Surgical History:  Procedure Laterality Date   aneurysm surgery  09/2003   DILATION AND CURETTAGE OF UTERUS  2007    Social History Social History   Tobacco Use   Smoking status: Every Day    Packs/day: 0.25    Types: Cigarettes   Smokeless tobacco: Never  Vaping Use   Vaping Use: Never used  Substance Use Topics   Alcohol use: No    Alcohol/week: 0.0 standard drinks   Drug use: No    Family History Family History  Problem Relation Age of Onset   Stroke Mother    Diabetes Mother    Heart disease Mother    Hypertension Mother    Alcohol abuse Father    Cancer Father        liver   Diabetes Brother    Hypertension Brother    Heart disease Brother    Cancer Brother         colon    Allergies  Allergen Reactions   Ambien [Zolpidem]    Asa [Aspirin]    Benazepril Hcl    Codeine Sulfate Nausea Only   Metoprolol Tartrate    Other Other (See Comments)    Valtura   Aldactone [Spironolactone] Other (See Comments)    Abdominal pain    Hctz [Hydrochlorothiazide] Rash     REVIEW OF SYSTEMS (Negative unless checked)  Constitutional: [] Weight loss  [] Fever  [] Chills Cardiac: [] Chest pain   [] Chest pressure   [] Palpitations   [] Shortness of breath when laying flat   [] Shortness of breath with exertion. Vascular:  [x] Pain in legs with walking   [] Pain in legs at rest  [] History of DVT   [] Phlebitis   [] Swelling in legs   [] Varicose veins   [] Non-healing ulcers Pulmonary:   [] Uses home oxygen   [] Productive cough   [] Hemoptysis   [] Wheeze  [] COPD   [] Asthma Neurologic:  [] Dizziness   [] Seizures   [] History of stroke   [] History of TIA  [] Aphasia   [] Vissual changes   [] Weakness or numbness in arm   [] Weakness or numbness in leg Musculoskeletal:   [] Joint swelling   [] Joint  pain   [] Low back pain Hematologic:  [] Easy bruising  [] Easy bleeding   [] Hypercoagulable state   [] Anemic Gastrointestinal:  [] Diarrhea   [] Vomiting  [] Gastroesophageal reflux/heartburn   [] Difficulty swallowing. Genitourinary:  [] Chronic kidney disease   [] Difficult urination  [] Frequent urination   [] Blood in urine Skin:  [] Rashes   [] Ulcers  Psychological:  [] History of anxiety   []  History of major depression.  Physical Examination  There were no vitals filed for this visit. There is no height or weight on file to calculate BMI. Gen: WD/WN, NAD Head: [place]/AT, No temporalis wasting.  Ear/Nose/Throat: Hearing grossly intact, nares w/o erythema or drainage Eyes: PER, EOMI, sclera nonicteric.  Neck: Supple, no masses.  No bruit or JVD.  Pulmonary:  Good air movement, no audible wheezing, no use of accessory muscles.  Cardiac: RRR, normal S1, S2, no Murmurs. Vascular:  mild trophic  changes, no open wounds Vessel Right Left  Radial Palpable Palpable  Gastrointestinal: soft, non-distended. No guarding/no peritoneal signs.  Musculoskeletal: M/S 5/5 throughout.  No visible deformity.  Neurologic: CN 2-12 intact. Pain and light touch intact in extremities.  Symmetrical.  Speech is fluent. Motor exam as listed above. Psychiatric: Judgment intact, Mood & affect appropriate for pt's clinical situation. Dermatologic: No rashes or ulcers noted.  No changes consistent with cellulitis.   CBC Lab Results  Component Value Date   WBC 6.5 05/09/2021   HGB 12.6 05/09/2021   HCT 38.4 05/09/2021   MCV 89 05/09/2021   PLT 278 05/09/2021    BMET    Component Value Date/Time   NA 138 05/09/2021 1150   K 3.5 05/09/2021 1150   CL 99 05/09/2021 1150   CO2 22 05/09/2021 1150   GLUCOSE 77 05/09/2021 1150   GLUCOSE 161 (H) 11/17/2018 1311   BUN 13 05/09/2021 1150   CREATININE 0.78 05/09/2021 1150   CALCIUM 9.2 05/09/2021 1150   GFRNONAA 70 08/21/2019 1012   GFRAA 81 08/21/2019 1012   CrCl cannot be calculated (Patient's most recent lab result is older than the maximum 21 days allowed.).  COAG No results found for: INR, PROTIME  Radiology No results found.   Assessment/Plan 1. Infrarenal abdominal aortic aneurysm (AAA) without rupture (HCC) Recommend:  The patient has a thoracic aortic aneurysm that is 5.0 cm or greater by duplex scan.  Therefore, the patient should undergo endovascular repair of the AAA to prevent future leathal rupture.   Patient will require CT angiography of the abdomen and pelvis in order to appropriately plan repair of the AAA if needed.  The patient will follow up with me in the office after the CT scan to review the study and finalize plan for repair.  - CT Angio Chest/Abd/Pel for Dissection W and/or W/WO; Future  2. Aortic atherosclerosis (HCC)  Recommend:  The patient has evidence of atherosclerosis of the lower extremities with  claudication.  The patient does not voice lifestyle limiting changes at this point in time.  Noninvasive studies do not suggest clinically significant change.  No invasive studies, angiography or surgery at this time The patient should continue walking and begin a more formal exercise program.  The patient should continue antiplatelet therapy and aggressive treatment of the lipid abnormalities  No changes in the patient's medications at this time  Continued surveillance is indicated as atherosclerosis is likely to progress with time.    The patient will continue follow up with noninvasive studies as ordered.   - CT Angio Chest/Abd/Pel for Dissection W and/or  W/WO; Future  3. Primary hypertension Continue antihypertensive medications as already ordered, these medications have been reviewed and there are no changes at this time.   4. Chronic obstructive pulmonary disease, unspecified COPD type ([place]) Continue pulmonary medications and aerosols as already ordered, these medications have been reviewed and there are no changes at this time.    5. Aneurysm of descending thoracic aorta without rupture (HCC) Recommend:  The patient has a thoracic aortic aneurysm that is 5.0 cm or greater by duplex scan.  Therefore, the patient should undergo endovascular repair of the AAA to prevent future leathal rupture.   Patient will require CT angiography of the abdomen and pelvis in order to appropriately plan repair of the TAA if needed.  The patient will follow up with me in the office after the CT scan to review the study and finalize plan for repair.   - CT Angio Chest/Abd/Pel for Dissection W and/or W/WO; Future    Hortencia Pilar, MD  07/10/2021 1:13 PM

## 2021-07-22 ENCOUNTER — Ambulatory Visit
Admission: RE | Admit: 2021-07-22 | Discharge: 2021-07-22 | Disposition: A | Discharge status: Home / Self Care | Payer: PPO | Source: Ambulatory Visit | Attending: Vascular Surgery | Admitting: Vascular Surgery

## 2021-07-22 DIAGNOSIS — I714 Abdominal aortic aneurysm, without rupture, unspecified: Secondary | ICD-10-CM | POA: Diagnosis not present

## 2021-07-22 DIAGNOSIS — I723 Aneurysm of iliac artery: Secondary | ICD-10-CM | POA: Diagnosis not present

## 2021-07-22 DIAGNOSIS — Z01818 Encounter for other preprocedural examination: Secondary | ICD-10-CM | POA: Diagnosis not present

## 2021-07-22 DIAGNOSIS — I700 Atherosclerosis of aorta: Secondary | ICD-10-CM | POA: Insufficient documentation

## 2021-07-22 DIAGNOSIS — I7143 Infrarenal abdominal aortic aneurysm, without rupture: Secondary | ICD-10-CM | POA: Diagnosis not present

## 2021-07-22 DIAGNOSIS — I7123 Aneurysm of the descending thoracic aorta, without rupture: Secondary | ICD-10-CM | POA: Diagnosis not present

## 2021-07-22 DIAGNOSIS — I712 Thoracic aortic aneurysm, without rupture, unspecified: Secondary | ICD-10-CM | POA: Diagnosis not present

## 2021-07-22 LAB — POCT I-STAT CREATININE: Creatinine, Ser: 0.8 mg/dL (ref 0.44–1.00)

## 2021-07-22 MED ORDER — IOHEXOL 350 MG/ML SOLN
100.0000 mL | Freq: Once | INTRAVENOUS | Status: AC | PRN
  Administered 2021-07-22: 100 mL via INTRAVENOUS

## 2021-07-25 ENCOUNTER — Other Ambulatory Visit: Payer: Self-pay | Admitting: Family Medicine

## 2021-07-25 NOTE — Telephone Encounter (Signed)
Medication Refill - Medication: Eszopiclone 3 MG TABS  Has the patient contacted their pharmacy? No.  Preferred Pharmacy (with phone number or street name):  [id number] - [place],  - [address] AT [facility] [address] [facility] Phone:  [phone]  Fax:  [phone]      Has the patient been seen for an appointment in the last year OR does the patient have an upcoming appointment? Yes.    Agent: Please be advised that RX refills may take up to 3 business days. We ask that you follow-up with your pharmacy.

## 2021-07-25 NOTE — Telephone Encounter (Signed)
Requested medication (s) are due for refill today: yes  Requested medication (s) are on the active medication list: yes  Last refill:  05/09/21 #30 2 refills  Future visit scheduled: yes in 2 weeks  Notes to clinic:  not delegated per protocol      Requested Prescriptions  Pending Prescriptions Disp Refills   Eszopiclone 3 MG TABS 30 tablet 2    Sig: TAKE 1 TABLET BY MOUTH IMMEDIATELY BEFORE BEDTIME. MUST LAST 30 DAYS     Not Delegated - Psychiatry:  Anxiolytics/Hypnotics Failed - 07/25/2021 11:44 AM      Failed - This refill cannot be delegated      Failed - Urine Drug Screen completed in last 360 days      Passed - Valid encounter within last 6 months    Recent Outpatient Visits           2 months ago Hypertension, unspecified type   [facility], Megan P, DO   5 months ago Primary insomnia   [facility] [place], [place], DO   8 months ago Needs flu shot   [facility], [place], DO   11 months ago Primary insomnia   [facility], [place], DO   1 year ago Hypertension, unspecified type   [facility] [place], Oralia Rud, DO       Future Appointments             In 2 weeks Laural Benes, Oralia Rud, DO [facility], [facility]

## 2021-07-31 DIAGNOSIS — R002 Palpitations: Secondary | ICD-10-CM | POA: Diagnosis not present

## 2021-07-31 DIAGNOSIS — I671 Cerebral aneurysm, nonruptured: Secondary | ICD-10-CM | POA: Diagnosis not present

## 2021-07-31 DIAGNOSIS — F17200 Nicotine dependence, unspecified, uncomplicated: Secondary | ICD-10-CM | POA: Diagnosis not present

## 2021-07-31 DIAGNOSIS — I429 Cardiomyopathy, unspecified: Secondary | ICD-10-CM | POA: Diagnosis not present

## 2021-07-31 DIAGNOSIS — I700 Atherosclerosis of aorta: Secondary | ICD-10-CM | POA: Diagnosis not present

## 2021-07-31 DIAGNOSIS — Z8679 Personal history of other diseases of the circulatory system: Secondary | ICD-10-CM | POA: Diagnosis not present

## 2021-07-31 DIAGNOSIS — I714 Abdominal aortic aneurysm, without rupture, unspecified: Secondary | ICD-10-CM | POA: Diagnosis not present

## 2021-07-31 DIAGNOSIS — I10 Essential (primary) hypertension: Secondary | ICD-10-CM | POA: Diagnosis not present

## 2021-07-31 DIAGNOSIS — I208 Other forms of angina pectoris: Secondary | ICD-10-CM | POA: Diagnosis not present

## 2021-07-31 DIAGNOSIS — R0602 Shortness of breath: Secondary | ICD-10-CM | POA: Diagnosis not present

## 2021-07-31 DIAGNOSIS — I493 Ventricular premature depolarization: Secondary | ICD-10-CM | POA: Diagnosis not present

## 2021-08-04 ENCOUNTER — Ambulatory Visit (INDEPENDENT_AMBULATORY_CARE_PROVIDER_SITE_OTHER): Payer: PPO | Admitting: Vascular Surgery

## 2021-08-04 ENCOUNTER — Other Ambulatory Visit: Payer: Self-pay | Admitting: Family Medicine

## 2021-08-04 ENCOUNTER — Encounter (INDEPENDENT_AMBULATORY_CARE_PROVIDER_SITE_OTHER): Payer: Self-pay | Admitting: Vascular Surgery

## 2021-08-04 VITALS — BP 131/92 | HR 88 | Resp 18 | Ht 64.0 in | Wt 123.0 lb

## 2021-08-04 DIAGNOSIS — I716 Thoracoabdominal aortic aneurysm, without rupture, unspecified: Secondary | ICD-10-CM | POA: Diagnosis not present

## 2021-08-04 DIAGNOSIS — J449 Chronic obstructive pulmonary disease, unspecified: Secondary | ICD-10-CM

## 2021-08-04 DIAGNOSIS — I10 Essential (primary) hypertension: Secondary | ICD-10-CM | POA: Diagnosis not present

## 2021-08-04 DIAGNOSIS — K219 Gastro-esophageal reflux disease without esophagitis: Secondary | ICD-10-CM | POA: Diagnosis not present

## 2021-08-04 DIAGNOSIS — I714 Abdominal aortic aneurysm, without rupture, unspecified: Secondary | ICD-10-CM | POA: Diagnosis not present

## 2021-08-04 NOTE — Progress Notes (Signed)
MRN : [id number]  Jasmine Camacho is a 66 y.o. (1955/10/17) female who presents with chief complaint of check circulation.  History of Present Illness:  The patient is seen for follow up evaluation of AAA status post CTA. There were no problems or complications related to the CT scan. The patient denies interval development of abdominal or back pain. No new lower extremity pain or discoloration of the toes.   The patient denies recent episodes of angina or shortness of breath. The patient denies interval anaurosis fugax. There is no recent history of TIA symptoms or focal motor deficits. The patient denies PAD or claudication symptoms.   CT angiography of the abdomen and pelvis shows a thoracoabdominal aneurysm which begins in the arch and is 4 to 5 cm in diameter from the arch through the visceral segment.  In the infrarenal segment she increases to 6.2 cm.  There is mild aneurysmal changes of the common iliac arteries.  No outpatient medications have been marked as taking for the 08/04/21 encounter (Office Visit) with Delana Meyer, Dolores Lory, MD.    Past Medical History:  Diagnosis Date   Acute respiratory failure ([place]) 11/17/2018   Aphasia    Hypertension    Insomnia    Menopausal symptom    Menorrhagia    Pneumonia    Seasonal allergies    Tachycardia    Thyroid disease    Ventricular premature contractions     Past Surgical History:  Procedure Laterality Date   aneurysm surgery  09/2003   DILATION AND CURETTAGE OF UTERUS  2007    Social History Social History   Tobacco Use   Smoking status: Every Day    Packs/day: 0.25    Types: Cigarettes   Smokeless tobacco: Never  Vaping Use   Vaping Use: Never used  Substance Use Topics   Alcohol use: No    Alcohol/week: 0.0 standard drinks of alcohol   Drug use: No    Family History Family History  Problem Relation Age of Onset   Stroke Mother    Diabetes Mother    Heart disease Mother    Hypertension  Mother    Alcohol abuse Father    Cancer Father        liver   Diabetes Brother    Hypertension Brother    Heart disease Brother    Cancer Brother        colon    Allergies  Allergen Reactions   Ambien [Zolpidem]    Asa [Aspirin]    Benazepril Hcl    Codeine Sulfate Nausea Only   Metoprolol Tartrate    Other Other (See Comments)    Valtura   Aldactone [Spironolactone] Other (See Comments)    Abdominal pain    Hctz [Hydrochlorothiazide] Rash     REVIEW OF SYSTEMS (Negative unless checked)  Constitutional: [] Weight loss  [] Fever  [] Chills Cardiac: [] Chest pain   [] Chest pressure   [] Palpitations   [] Shortness of breath when laying flat   [] Shortness of breath with exertion. Vascular:  [x] Pain in legs with walking   [] Pain in legs at rest  [] History of DVT   [] Phlebitis   [] Swelling in legs   [] Varicose veins   [] Non-healing ulcers Pulmonary:   [] Uses home oxygen   [] Productive cough   [] Hemoptysis   [] Wheeze  [] COPD   [] Asthma Neurologic:  [] Dizziness   [] Seizures   [] History of stroke   []   History of TIA  [] Aphasia   [] Vissual changes   [] Weakness or numbness in arm   [] Weakness or numbness in leg Musculoskeletal:   [] Joint swelling   [] Joint pain   [] Low back pain Hematologic:  [] Easy bruising  [] Easy bleeding   [] Hypercoagulable state   [] Anemic Gastrointestinal:  [] Diarrhea   [] Vomiting  [x] Gastroesophageal reflux/heartburn   [] Difficulty swallowing. Genitourinary:  [] Chronic kidney disease   [] Difficult urination  [] Frequent urination   [] Blood in urine Skin:  [] Rashes   [] Ulcers  Psychological:  [] History of anxiety   []  History of major depression.  Physical Examination  Vitals:   08/04/21 1535  BP: (!) 131/92  Pulse: 88  Resp: 18  Weight: 123 lb (55.8 kg)  Height: 5\' 4"  (1.626 m)   Body mass index is 21.11 kg/m. Gen: WD/WN, NAD Head: [place]/AT, No temporalis wasting.  Ear/Nose/Throat: Hearing grossly intact, nares w/o erythema or drainage Eyes: PER, EOMI,  sclera nonicteric.  Neck: Supple, no masses.  No bruit or JVD.  Pulmonary:  Good air movement, no audible wheezing, no use of accessory muscles.  Cardiac: RRR, normal S1, S2, no Murmurs. Vascular:   no open wounds Vessel Right Left  Radial Palpable Palpable  PT Not Palpable Not Palpable  DP Not Palpable Not Palpable  Gastrointestinal: soft, non-distended. No guarding/no peritoneal signs.  Musculoskeletal: M/S 5/5 throughout.  No visible deformity.  Neurologic: CN 2-12 intact. Pain and light touch intact in extremities.  Symmetrical.  Speech is fluent. Motor exam as listed above. Psychiatric: Judgment intact, Mood & affect appropriate for pt's clinical situation. Dermatologic: No rashes or ulcers noted.  No changes consistent with cellulitis.   CBC Lab Results  Component Value Date   WBC 6.5 05/09/2021   HGB 12.6 05/09/2021   HCT 38.4 05/09/2021   MCV 89 05/09/2021   PLT 278 05/09/2021    BMET    Component Value Date/Time   NA 138 05/09/2021 1150   K 3.5 05/09/2021 1150   CL 99 05/09/2021 1150   CO2 22 05/09/2021 1150   GLUCOSE 77 05/09/2021 1150   GLUCOSE 161 (H) 11/17/2018 1311   BUN 13 05/09/2021 1150   CREATININE 0.80 07/22/2021 0821   CALCIUM 9.2 05/09/2021 1150   GFRNONAA 70 08/21/2019 1012   GFRAA 81 08/21/2019 1012   Estimated Creatinine Clearance: 60.5 mL/min (by C-G formula based on SCr of 0.8 mg/dL).  COAG No results found for: "INR", "PROTIME"  Radiology CT Angio Chest/Abd/Pel for Dissection W and/or W/WO  Result Date: 07/23/2021 CLINICAL DATA:  Thoracic aorta disease, pre-op planning EXAM: CT ANGIOGRAPHY CHEST, ABDOMEN AND PELVIS TECHNIQUE: Non-contrast CT of the chest was initially obtained. Multidetector CT imaging through the chest, abdomen and pelvis was performed using the standard protocol during bolus administration of intravenous contrast. Multiplanar reconstructed images and MIPs were obtained and reviewed to evaluate the vascular anatomy.  RADIATION DOSE REDUCTION: This exam was performed according to the departmental dose-optimization program which includes automated exposure control, adjustment of the mA and/or kV according to patient size and/or use of iterative reconstruction technique. CONTRAST:  134mL OMNIPAQUE IOHEXOL 350 MG/ML SOLN COMPARISON:  11/17/2018 FINDINGS: CTA CHEST FINDINGS Cardiovascular: Four-chamber cardiac enlargement. No pericardial effusion. Fair contrast opacification of the pulmonary arterial tree with no filling defects to suggest acute PE. No left atrial thrombus. Aortic valve leaflet coarse calcifications. Extensive coronary calcifications. Good contrast opacification of the thoracic aorta without dissection or stenosis. Classic 3 vessel brachiocephalic arterial origin anatomy with calcified plaque resulting in mild  stenosis of all 3 vessels. Aortic Root: --Valve: 2.6 cm --Sinuses: 3.5 cm --Sinotubular Junction: 2.8 cm Limitations by motion: Mild Thoracic Aorta: --Ascending Aorta: 3.3 cm --Aortic Arch: 3.4 cm --Descending Aorta: Isthmus 4 cm, 4.8 cm at the level of the carina (previously 3.8), 4.3 cm above the diaphragm. There is extensive irregular nonocclusive atheromatous thrombus through the aneurysmal descending aorta, with possible embolic risk. Mediastinum/Nodes: 1.5 cm precarinal node, previously 1.9 cm. No hilar adenopathy. No mass or hematoma. Lungs/Pleura: No pleural effusion. No pneumothorax. Geographic ground-glass opacities in the lung bases with septal prominence suggesting mild interstitial edema. Pulmonary emphysematous changes most marked in the apices. Musculoskeletal: No chest wall abnormality. No acute or significant osseous findings. Review of the MIP images confirms the above findings. CTA ABDOMEN AND PELVIS FINDINGS VASCULAR Aorta: Supra celiac segment 4.5 cm, juxtarenal 4.9 cm, infrarenal fusiform aneurysm up to 6.2 x 5.9 cm, tapering to 2.8 cm just above the bifurcation. There is extensive  nonocclusive mural thrombus particularly in the infrarenal segment. No evidence of leak or impending rupture. Celiac: High-grade ostial stenosis over length of approximately 1.6 cm, patent distally with classic branch anatomy. SMA: Mild short-segment origin stenosis of doubtful hemodynamic significance, patent distally with classic distal branch anatomy. Renals: Single bilaterally, both with short-segment ostial stenoses of doubtful hemodynamic significance, patent distally. IMA: Apparent origin occlusion, distal branches perfused by collaterals. Inflow: 1.8 cm right common iliac artery aneurysm. Tandem stenoses in the proximal and mid right external iliac, of probable hemodynamic significance. On the left, 1.8 cm dilatation of common iliac. Short-segment stenosis of the mid external iliac of probable hemodynamic significance. Veins: No obvious venous abnormality within the limitations of this arterial phase study. Review of the MIP images confirms the above findings. NON-VASCULAR Hepatobiliary: No focal liver abnormality is seen. No gallstones, gallbladder wall thickening, or biliary dilatation. Pancreas: Unremarkable. No pancreatic ductal dilatation or surrounding inflammatory changes. Spleen: Normal in size without focal abnormality. Adrenals/Urinary Tract: Adrenal glands are unremarkable. Kidneys are normal, without renal calculi, focal lesion, or hydronephrosis. Bladder is unremarkable. Stomach/Bowel: Stomach and small bowel are nondistended. Moderate proximal colonic fecal material, sigmoid colon and rectum relatively decompressed. Lymphatic: No abdominal or pelvic adenopathy. Reproductive: Uterus and bilateral adnexa are unremarkable. Other: Bilateral pelvic phleboliths.  No ascites.  No free air. Musculoskeletal: Advanced facet DJD lower lumbar spine, probably accounting for the grade 1 all anterolisthesis L5-S1. Review of the MIP images confirms the above findings. IMPRESSION: 1. Thoracoabdominal aneurysm  measuring 4.8 cm mid descending (previously 3.8), 6.2 cm infrarenal. Current guidelines recommend referral to a vascular specialist(note: the patient is currently under the care of a vascular specialist/surgeon). 2. 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 Bilateral 1.8 cm common iliac artery aneurysms. 4. Bilateral external iliac artery stenoses of probable hemodynamic significance. Electronically Signed   By: Corlis Leak M.D.   On: 07/23/2021 10:08     Assessment/Plan 1. Abdominal aortic aneurysm (AAA) without rupture, unspecified part (HCC) Recommend:  The infrarenal portion of her aneurysm is > 6 cm and therefore should undergo repair. Patient is status post CT scan of the abdominal aorta. The patient is not a candidate for a standard infrarenal endovascular repair.  Therefore I will refer her to Dr. Bennie Pierini at [facility] for evaluation for repair using a fenestrated stent graft.  The patient will require cardiac clearance prior to stent graft placement.   The patient will continue antiplatelet therapy as prescribed (since the patient is undergoing endovascular repair as  opposed to open repair) as well as aggressive management of hyperlipidemia. Exercise is again strongly encouraged.   The patient is reminded that lifetime routine surveillance is a necessity with an endograft.   Patient will follow-up with me in the office after the surgery.  - Ambulatory referral to Vascular Surgery  2. Thoracoabdominal aortic aneurysm (TAAA) without rupture, unspecified part (HCC) Recommend:  The infrarenal portion of her aneurysm is > 6 cm and therefore should undergo repair. Patient is status post CT scan of the abdominal aorta. The patient is not a candidate for a standard infrarenal endovascular repair.  Therefore I will refer her to Dr. Bennie Pierini at [facility] for evaluation for repair using a fenestrated stent graft.  The patient will require cardiac clearance prior to stent graft  placement.   The patient will continue antiplatelet therapy as prescribed (since the patient is undergoing endovascular repair as opposed to open repair) as well as aggressive management of hyperlipidemia. Exercise is again strongly encouraged.   The patient is reminded that lifetime routine surveillance is a necessity with an endograft.   Patient will follow-up with me in the office after the surgery.  - Ambulatory referral to Vascular Surgery  3. Primary hypertension Continue antihypertensive medications as already ordered, these medications have been reviewed and there are no changes at this time.   4. Chronic obstructive pulmonary disease, unspecified COPD type (HCC) Continue pulmonary medications and aerosols as already ordered, these medications have been reviewed and there are no changes at this time.    5. Gastroesophageal reflux disease without esophagitis Continue PPI as already ordered, this medication has been reviewed and there are no changes at this time.  Avoidence of caffeine and alcohol  Moderate elevation of the head of the bed      Levora Dredge, MD  08/04/2021 3:36 PM

## 2021-08-05 MED ORDER — ESZOPICLONE 3 MG PO TABS: ORAL_TABLET | ORAL | 0 refills | Status: DC

## 2021-08-05 NOTE — Telephone Encounter (Signed)
Called and spoke to patient's husband. He states that the patient only has 2 tablets left and does not have enough to get to her appointment on Friday.

## 2021-08-06 ENCOUNTER — Encounter (INDEPENDENT_AMBULATORY_CARE_PROVIDER_SITE_OTHER): Payer: Self-pay | Admitting: Vascular Surgery

## 2021-08-06 ENCOUNTER — Telehealth (INDEPENDENT_AMBULATORY_CARE_PROVIDER_SITE_OTHER): Payer: Self-pay | Admitting: Vascular Surgery

## 2021-08-06 DIAGNOSIS — I716 Thoracoabdominal aortic aneurysm, without rupture, unspecified: Secondary | ICD-10-CM | POA: Insufficient documentation

## 2021-08-06 DIAGNOSIS — K219 Gastro-esophageal reflux disease without esophagitis: Secondary | ICD-10-CM | POA: Insufficient documentation

## 2021-08-06 NOTE — Telephone Encounter (Signed)
I sent the referral via fax and electronically around 12:45 today. I sent it to Dr. Mercie Eon at [facility] with a telephone of [phone] and fax of [phone]. I did receive confirmation that the fax was a success with 23 pages sent at 12:44:11 pm. 6.28.23

## 2021-08-06 NOTE — Telephone Encounter (Signed)
I will speak with Dr Gilda Crease when returns in the office

## 2021-08-06 NOTE — Telephone Encounter (Signed)
Patient's husband called and stated Dr. Gilda Crease was supposed to submit a referral to see Dr. Pattricia Boss at [facility] and he stated if patient had not heard from him to call us back.  Please advise.

## 2021-08-07 DIAGNOSIS — I716 Thoracoabdominal aortic aneurysm, without rupture, unspecified: Secondary | ICD-10-CM | POA: Diagnosis not present

## 2021-08-07 DIAGNOSIS — I714 Abdominal aortic aneurysm, without rupture, unspecified: Secondary | ICD-10-CM | POA: Diagnosis not present

## 2021-08-07 DIAGNOSIS — Z720 Tobacco use: Secondary | ICD-10-CM | POA: Diagnosis not present

## 2021-08-08 ENCOUNTER — Ambulatory Visit (INDEPENDENT_AMBULATORY_CARE_PROVIDER_SITE_OTHER): Payer: PPO | Admitting: Family Medicine

## 2021-08-08 ENCOUNTER — Encounter: Payer: Self-pay | Admitting: Family Medicine

## 2021-08-08 VITALS — BP 132/86 | HR 81 | Temp 98.2°F | Ht 64.17 in | Wt 124.0 lb

## 2021-08-08 DIAGNOSIS — I5022 Chronic systolic (congestive) heart failure: Secondary | ICD-10-CM | POA: Diagnosis not present

## 2021-08-08 DIAGNOSIS — I716 Thoracoabdominal aortic aneurysm, without rupture, unspecified: Secondary | ICD-10-CM

## 2021-08-08 DIAGNOSIS — F5101 Primary insomnia: Secondary | ICD-10-CM

## 2021-08-08 DIAGNOSIS — Z136 Encounter for screening for cardiovascular disorders: Secondary | ICD-10-CM | POA: Diagnosis not present

## 2021-08-08 DIAGNOSIS — Z23 Encounter for immunization: Secondary | ICD-10-CM

## 2021-08-08 DIAGNOSIS — Z131 Encounter for screening for diabetes mellitus: Secondary | ICD-10-CM | POA: Diagnosis not present

## 2021-08-08 DIAGNOSIS — I714 Abdominal aortic aneurysm, without rupture, unspecified: Secondary | ICD-10-CM

## 2021-08-08 DIAGNOSIS — Z124 Encounter for screening for malignant neoplasm of cervix: Secondary | ICD-10-CM

## 2021-08-08 DIAGNOSIS — E0590 Thyrotoxicosis, unspecified without thyrotoxic crisis or storm: Secondary | ICD-10-CM

## 2021-08-08 DIAGNOSIS — Z13820 Encounter for screening for osteoporosis: Secondary | ICD-10-CM

## 2021-08-08 DIAGNOSIS — I700 Atherosclerosis of aorta: Secondary | ICD-10-CM | POA: Diagnosis not present

## 2021-08-08 DIAGNOSIS — I10 Essential (primary) hypertension: Secondary | ICD-10-CM

## 2021-08-08 DIAGNOSIS — F17200 Nicotine dependence, unspecified, uncomplicated: Secondary | ICD-10-CM | POA: Diagnosis not present

## 2021-08-08 DIAGNOSIS — R9431 Abnormal electrocardiogram [ECG] [EKG]: Secondary | ICD-10-CM

## 2021-08-08 DIAGNOSIS — I429 Cardiomyopathy, unspecified: Secondary | ICD-10-CM | POA: Diagnosis not present

## 2021-08-08 DIAGNOSIS — Z0000 Encounter for general adult medical examination without abnormal findings: Secondary | ICD-10-CM

## 2021-08-08 LAB — BAYER DCA HB A1C WAIVED: HB A1C (BAYER DCA - WAIVED): 5.4 % (ref 4.8–5.6)

## 2021-08-08 MED ORDER — ESZOPICLONE 3 MG PO TABS
ORAL_TABLET | ORAL | 2 refills | Status: DC
Start: 2021-08-08 — End: 2021-10-28

## 2021-08-08 NOTE — Progress Notes (Signed)
BP 132/86   Pulse 81   Temp 98.2 F (36.8 C) (Oral)   Ht 5' 4.17" (1.63 m)   Wt 124 lb (56.2 kg)   LMP  (LMP Unknown)   SpO2 99%   BMI 21.17 kg/m    Subjective:    Patient ID: Jasmine Camacho, female    DOB: 1955-11-27, 66 y.o.   MRN: [id number]  HPI: Jasmine Camacho is a 66 y.o. female presenting on 08/08/2021 for comprehensive medical examination. Current medical complaints include:  INSOMNIA Duration: {Blank single:19197::"chronic","months","years"} Satisfied with sleep quality: {Blank single:19197::"yes","no"} Difficulty falling asleep: {Blank single:19197::"yes","no"} Difficulty staying asleep: {Blank single:19197::"yes","no"} Waking a few hours after sleep onset: {Blank single:19197::"yes","no"} Early morning awakenings: {Blank single:19197::"yes","no"} Daytime hypersomnolence: {Blank single:19197::"yes","no"} Wakes feeling refreshed: {Blank single:19197::"yes","no"} Good sleep hygiene: {Blank single:19197::"yes","no"} Apnea: {Blank single:19197::"yes","no"} Snoring: {Blank single:19197::"yes","no"} Depressed/anxious mood: {Blank single:19197::"yes","no"} Recent stress: {Blank single:19197::"yes","no"} Restless legs/nocturnal leg cramps: {Blank single:19197::"yes","no"} Chronic pain/arthritis: {Blank single:19197::"yes","no"} History of sleep study: {Blank single:19197::"yes","no"} Treatments attempted: {Blank multiple:19196::"none","melatonin","uinsom","benadryl","ambien"}   HYPERTENSION / HYPERLIPIDEMIA Satisfied with current treatment? {Blank single:19197::"yes","no"} Duration of hypertension: {Blank single:19197::"chronic","months","years"} BP monitoring frequency: {Blank single:19197::"not checking","rarely","daily","weekly","monthly","a few times a day","a few times a week","a few times a month"} BP range:  BP medication side effects: {Blank single:19197::"yes","no"} Past BP meds: {Blank  multiple:19196::"none","amlodipine","amlodipine/benazepril","atenolol","benazepril","benazepril/HCTZ","bisoprolol (bystolic)","carvedilol","chlorthalidone","clonidine","diltiazem","exforge HCT","HCTZ","irbesartan (avapro)","labetalol","lisinopril","lisinopril-HCTZ","losartan (cozaar)","methyldopa","nifedipine","olmesartan (benicar)","olmesartan-HCTZ","quinapril","ramipril","spironalactone","tekturna","valsartan","valsartan-HCTZ","verapamil"} Duration of hyperlipidemia: {Blank single:19197::"chronic","months","years"} Cholesterol medication side effects: {Blank single:19197::"yes","no"} Cholesterol supplements: {Blank multiple:19196::"none","fish oil","niacin","red yeast rice"} Past cholesterol medications: {Blank multiple:19196::"none","atorvastain (lipitor)","lovastatin (mevacor)","pravastatin (pravachol)","rosuvastatin (crestor)","simvastatin (zocor)","vytorin","fenofibrate (tricor)","gemfibrozil","ezetimide (zetia)","niaspan","lovaza"} Medication compliance: {Blank single:19197::"excellent compliance","good compliance","fair compliance","poor compliance"} Aspirin: {Blank single:19197::"yes","no"} Recent stressors: {Blank single:19197::"yes","no"} Recurrent headaches: {Blank single:19197::"yes","no"} Visual changes: {Blank single:19197::"yes","no"} Palpitations: {Blank single:19197::"yes","no"} Dyspnea: {Blank single:19197::"yes","no"} Chest pain: {Blank single:19197::"yes","no"} Lower extremity edema: {Blank single:19197::"yes","no"} Dizzy/lightheaded: {Blank single:19197::"yes","no"}  COPD COPD status: {Blank single:19197::"controlled","uncontrolled","better","worse","exacerbated","stable"} Satisfied with current treatment?: {Blank single:19197::"yes","no"} Oxygen use: {Blank single:19197::"yes","no"} Dyspnea frequency:  Cough frequency:  Rescue inhaler frequency:   Limitation of activity: {Blank single:19197::"yes","no"} Productive cough:  Last Spirometry:  Pneumovax: {Blank  single:19197::"Up to Date","Not up to Date","unknown"} Influenza: {Blank single:19197::"Up to Date","Not up to Date","unknown"}  She currently lives with: husband Menopausal Symptoms: no  Functional Status Survey: Is the patient deaf or have difficulty hearing?: Yes Does the patient have difficulty seeing, even when wearing glasses/contacts?: Yes Does the patient have difficulty concentrating, remembering, or making decisions?: Yes Does the patient have difficulty walking or climbing stairs?: No Does the patient have difficulty dressing or bathing?: No Does the patient have difficulty doing errands alone such as visiting a doctor's office or shopping?: No     08/08/2021   10:35 AM 08/07/2020    9:13 AM 01/24/2019    2:40 PM 09/14/2018   11:07 AM 05/10/2017   10:01 AM  Fall Risk   Falls in the past year? 0 0 0 0 No  Number falls in past yr: 0 0 0 0   Injury with Fall? 0 0 0 0   Risk for fall due to : No Fall Risks No Fall Risks     Follow up Falls evaluation completed Falls evaluation completed       Depression Screen    08/08/2021   10:35 AM 05/09/2021   11:10 AM 08/07/2020    9:14 AM 05/17/2020    9:51 AM 01/24/2019    2:41 PM  Depression screen PHQ 2/9  Decreased Interest 0 0 0 0 0  Down, Depressed, Hopeless 0 0 0 0 0  PHQ - 2 Score 0 0 0 0 0  Altered sleeping 0 1  Tired, decreased energy 1 1     Change in appetite 0 0     Feeling bad or failure about yourself  0 0     Trouble concentrating 0 0     Moving slowly or fidgety/restless 1 1     Suicidal thoughts 0 0     PHQ-9 Score 2 3     Difficult doing work/chores Not difficult at all        Advanced Directives Does patient have a HCPOA?     unsure Does patient have a living will or MOST form?  no  Past Medical History:  Past Medical History:  Diagnosis Date   Acute respiratory failure (HCC) 11/17/2018   Aphasia    Hypertension    Insomnia    Menopausal symptom    Menorrhagia    Pneumonia    Seasonal allergies     Tachycardia    Thyroid disease    Ventricular premature contractions     Surgical History:  Past Surgical History:  Procedure Laterality Date   aneurysm surgery  09/2003   DILATION AND CURETTAGE OF UTERUS  2007    Medications:  Current Outpatient Medications on File Prior to Visit  Medication Sig   acetaminophen (TYLENOL) 650 MG CR tablet Take 650 mg by mouth every 8 (eight) hours as needed (pain or fever).   albuterol (VENTOLIN HFA) 108 (90 Base) MCG/ACT inhaler INL 2 PFS PO Q 4 TO 6 H PRN   carvedilol (COREG) 3.125 MG tablet Take by mouth.   Eszopiclone 3 MG TABS TAKE 1 TABLET BY MOUTH IMMEDIATELY BEFORE BEDTIME. MUST LAST 30 DAYS   fluticasone-salmeterol (ADVAIR) 100-50 MCG/ACT AEPB Inhale 1 puff into the lungs 2 (two) times daily.   furosemide (LASIX) 20 MG tablet TAKE 2 TABLETS(40 MG) BY MOUTH DAILY   losartan (COZAAR) 100 MG tablet Take 100 mg by mouth daily.   methimazole (TAPAZOLE) 5 MG tablet Take 5 mg by mouth daily.    montelukast (SINGULAIR) 10 MG tablet TAKE 1 TABLET(10 MG) BY MOUTH AT BEDTIME   omeprazole (PRILOSEC) 20 MG capsule Take 1 capsule (20 mg total) by mouth daily.   rosuvastatin (CRESTOR) 20 MG tablet Take by mouth.   No current facility-administered medications on file prior to visit.    Allergies:  Allergies  Allergen Reactions   Ambien [Zolpidem]    Asa [Aspirin]    Benazepril Hcl    Codeine Sulfate Nausea Only   Metoprolol Tartrate    Other Other (See Comments)    Valtura   Aldactone [Spironolactone] Other (See Comments)    Abdominal pain    Hctz [Hydrochlorothiazide] Rash    Social History:  Social History   Socioeconomic History   Marital status: Married    Spouse name: Not on file   Number of children: Not on file   Years of education: Not on file   Highest education level: Not on file  Occupational History   Not on file  Tobacco Use   Smoking status: Every Day    Packs/day: 0.25    Types: Cigarettes   Smokeless tobacco:  Never  Vaping Use   Vaping Use: Never used  Substance and Sexual Activity   Alcohol use: No    Alcohol/week: 0.0 standard drinks of alcohol   Drug use: No   Sexual activity: Never  Other Topics Concern   Not on file  Social History Narrative   Not on file   Social Determinants of Health   Financial  Resource Strain: Not on file  Food Insecurity: Not on file  Transportation Needs: Not on file  Physical Activity: Not on file  Stress: Not on file  Social Connections: Not on file  Intimate Partner Violence: Not on file   Social History   Tobacco Use  Smoking Status Every Day   Packs/day: 0.25   Types: Cigarettes  Smokeless Tobacco Never   Social History   Substance and Sexual Activity  Alcohol Use No   Alcohol/week: 0.0 standard drinks of alcohol    Family History:  Family History  Problem Relation Age of Onset   Stroke Mother    Diabetes Mother    Heart disease Mother    Hypertension Mother    Alcohol abuse Father    Cancer Father        liver   Diabetes Brother    Hypertension Brother    Heart disease Brother    Cancer Brother        colon    Past medical history, surgical history, medications, allergies, family history and social history reviewed with patient today and changes made to appropriate areas of the chart.   ROS  All other ROS negative except what is listed above and in the HPI.      Objective:    BP 132/86   Pulse 81   Temp 98.2 F (36.8 C) (Oral)   Ht 5' 4.17" (1.63 m)   Wt 124 lb (56.2 kg)   LMP  (LMP Unknown)   SpO2 99%   BMI 21.17 kg/m   Wt Readings from Last 3 Encounters:  08/08/21 124 lb (56.2 kg)  08/04/21 123 lb (55.8 kg)  07/10/21 127 lb (57.6 kg)    Hearing Screening   500Hz  1000Hz  2000Hz  4000Hz   Right ear 40 40 40 40  Left ear 40 40 40 40   Vision Screening   Right eye Left eye Both eyes  Without correction 20/70 20/70 20/50   With correction       Physical Exam     08/08/2021   10:39 AM  6CIT Screen   What Year? 0 points  What month? 0 points  What time? 0 points  Count back from 20 0 points  Months in reverse 4 points  Repeat phrase 0 points  Total Score 4 points   Results for orders placed or performed during the hospital encounter of 07/22/21  I-STAT creatinine  Result Value Ref Range   Creatinine, Ser 0.80 0.44 - 1.00 mg/dL      Assessment & Plan:   Problem List Items Addressed This Visit       Cardiovascular and Mediastinum   Hypertension   Relevant Orders   CBC with Differential/Platelet   Comprehensive metabolic panel   Microalbumin, Urine Waived     Endocrine   Hyperthyroidism   Relevant Orders   TSH     Other   Tobacco dependence   Relevant Orders   Urinalysis, Routine w reflex microscopic   Other Visit Diagnoses     Welcome to Medicare preventive visit    -  Primary   Relevant Orders   EKG 12-Lead (Completed)   Screening for cardiovascular condition       Relevant Orders   EKG 12-Lead (Completed)   Lipid Panel w/o Chol/HDL Ratio   Screening for diabetes mellitus (DM)       Relevant Orders   Bayer DCA Hb A1c Waived   Screening for cervical cancer       Relevant  Orders   Cytology - PAP   Screening for osteoporosis       Relevant Orders   DG Bone Density   Need for vaccination for pneumococcus       Relevant Orders   Pneumococcal conjugate vaccine 20-valent (Prevnar 20)        Preventative Services:  AAA screening: up to date Health Risk Assessment and Personalized Prevention Plan: done today Bone Mass Measurements: ordered today Breast Cancer Screening: refused CVD Screening: done today Cervical Cancer Screening: refused Colon Cancer Screening: refused Depression Screening: done today Diabetes Screening: done today Glaucoma Screening: See your eye doctor Hepatitis B vaccine: N/A Hepatitis C screening: up to date HIV Screening: up to date Flu Vaccine: postpone to flu season Lung cancer Screening: Recent CT was normal Obesity  Screening: done today Pneumonia Vaccines (2): given today STI Screening: N/A  Follow up plan: Return in about 3 months (around 11/06/2021) for before 11/06/21 please so she doesn't run out of her medicine.   LABORATORY TESTING:  - Pap smear:  REFUSED  IMMUNIZATIONS:   - Tdap: Tetanus vaccination status reviewed: last tetanus booster within 10 years. - Influenza: Up to date - Pneumovax: Up to date - Prevnar: Administered today - Zostavax vaccine: Refused  SCREENING: -Mammogram: Refused  - Colonoscopy: Refused  - Bone Density: Ordered today  -Hearing Test: Up to date   NEXT PREVENTATIVE PHYSICAL DUE IN 1 YEAR. Return in about 3 months (around 11/06/2021) for before 11/06/21 please so she doesn't run out of her medicine.

## 2021-08-08 NOTE — Patient Instructions (Signed)
Preventative Services:  AAA screening: up to date Health Risk Assessment and Personalized Prevention Plan: done today Bone Mass Measurements: ordered today Breast Cancer Screening: refused CVD Screening: done today Cervical Cancer Screening: refused Colon Cancer Screening: refused Depression Screening: done today Diabetes Screening: done today Glaucoma Screening: See your eye doctor Hepatitis B vaccine: N/A Hepatitis C screening: up to date HIV Screening: up to date Flu Vaccine: postpone to flu season Lung cancer Screening: Recent CT was normal Obesity Screening: done today Pneumonia Vaccines (2): given today STI Screening: N/A  Please call to schedule your bone density: [facility]  Address: [address], [place], [place] [address] Phone: [phone]

## 2021-08-09 LAB — COMPREHENSIVE METABOLIC PANEL
ALT: 21 IU/L (ref 0–32)
AST: 19 IU/L (ref 0–40)
Albumin/Globulin Ratio: 1.5 (ref 1.2–2.2)
Albumin: 3.9 g/dL (ref 3.8–4.8)
Alkaline Phosphatase: 124 IU/L — ABNORMAL HIGH (ref 44–121)
BUN/Creatinine Ratio: 22 (ref 12–28)
BUN: 19 mg/dL (ref 8–27)
Bilirubin Total: 0.5 mg/dL (ref 0.0–1.2)
CO2: 23 mmol/L (ref 20–29)
Calcium: 9.4 mg/dL (ref 8.7–10.3)
Chloride: 104 mmol/L (ref 96–106)
Creatinine, Ser: 0.86 mg/dL (ref 0.57–1.00)
Globulin, Total: 2.6 g/dL (ref 1.5–4.5)
Glucose: 90 mg/dL (ref 70–99)
Potassium: 5.2 mmol/L (ref 3.5–5.2)
Sodium: 140 mmol/L (ref 134–144)
Total Protein: 6.5 g/dL (ref 6.0–8.5)
eGFR: 75 mL/min/{1.73_m2} (ref >59)

## 2021-08-09 LAB — CBC WITH DIFFERENTIAL/PLATELET
Basophils Absolute: 0.1 10*3/uL (ref 0.0–0.2)
Basos: 1 %
EOS (ABSOLUTE): 0.5 10*3/uL — ABNORMAL HIGH (ref 0.0–0.4)
Eos: 8 %
Hematocrit: 35.1 % (ref 34.0–46.6)
Hemoglobin: 11.9 g/dL (ref 11.1–15.9)
Immature Grans (Abs): 0 10*3/uL (ref 0.0–0.1)
Immature Granulocytes: 0 %
Lymphocytes Absolute: 1.1 10*3/uL (ref 0.7–3.1)
Lymphs: 19 %
MCH: 30.5 pg (ref 26.6–33.0)
MCHC: 33.9 g/dL (ref 31.5–35.7)
MCV: 90 fL (ref 79–97)
Monocytes Absolute: 0.6 10*3/uL (ref 0.1–0.9)
Monocytes: 11 %
Neutrophils Absolute: 3.5 10*3/uL (ref 1.4–7.0)
Neutrophils: 61 %
Platelets: 199 10*3/uL (ref 150–450)
RBC: 3.9 x10E6/uL (ref 3.77–5.28)
RDW: 14.1 % (ref 11.7–15.4)
WBC: 5.7 10*3/uL (ref 3.4–10.8)

## 2021-08-09 LAB — LIPID PANEL W/O CHOL/HDL RATIO
Cholesterol, Total: 110 mg/dL (ref 100–199)
HDL: 42 mg/dL (ref >39)
LDL Chol Calc (NIH): 56 mg/dL (ref 0–99)
Triglycerides: 48 mg/dL (ref 0–149)
VLDL Cholesterol Cal: 12 mg/dL (ref 5–40)

## 2021-08-09 LAB — TSH: TSH: 4.32 u[IU]/mL (ref 0.450–4.500)

## 2021-08-11 ENCOUNTER — Telehealth: Payer: Self-pay | Admitting: *Deleted

## 2021-08-11 NOTE — Assessment & Plan Note (Signed)
Not interested in quitting at this time. Had recent CT chest without sign of cancer. Call with any concerns.

## 2021-08-11 NOTE — Assessment & Plan Note (Signed)
Euvolemic today. Will keep BP and cholesterol under good control. Continue to follow with cardiology. Call with any concerns.  

## 2021-08-11 NOTE — Assessment & Plan Note (Signed)
Under good control on current regimen. Continue current regimen. Continue to monitor. Call with any concerns. Refills given for 3 months. Follow up 3 months.    

## 2021-08-11 NOTE — Assessment & Plan Note (Signed)
Will keep BP and cholesterol under good control. Continue to follow with cardiology. Call with any concerns.  

## 2021-08-11 NOTE — Progress Notes (Signed)
Interpreted by me on 08/08/21. NSR at 74bpm with ST segment elevations. Asymptomatic. Saw cardiology 8 days ago. EKG sent to cardiology and they were called directly. They stated that they would review EKG and contact patient directly with plan. Patient is aware and was given instructions regarding warning signs for which to go to the ER. She and husband are aware.

## 2021-08-11 NOTE — Assessment & Plan Note (Signed)
Under good control on current regimen. Continue current regimen. Continue to monitor. Call with any concerns. Refills given. Labs drawn today.   

## 2021-08-11 NOTE — Assessment & Plan Note (Signed)
Will keep BP and cholesterol under good control. Continue to follow with cardiology and vascular. Call with any concerns.  

## 2021-08-11 NOTE — Assessment & Plan Note (Signed)
Rechecking labs today. Continue to follow with endocrinology. Call with any concerns.

## 2021-08-11 NOTE — Assessment & Plan Note (Signed)
Asymptomatic. Saw cardiology 8 days ago. EKG sent to cardiology and they were called directly. They stated that they would review EKG and contact patient directly with plan. Patient is aware and was given instructions regarding warning signs for which to go to the ER. She and husband are aware.

## 2021-08-11 NOTE — Telephone Encounter (Signed)
Pharmacy calling for verification for insurance purposes that the original prescription for Lunesta 3mg  #3 was an error and has been cancelled.

## 2021-08-11 NOTE — Assessment & Plan Note (Signed)
Will keep BP and cholesterol under good control. Continue to follow with cardiology and vascular. Call with any concerns.

## 2021-08-13 DIAGNOSIS — I714 Abdominal aortic aneurysm, without rupture, unspecified: Secondary | ICD-10-CM | POA: Diagnosis not present

## 2021-08-13 DIAGNOSIS — Z8679 Personal history of other diseases of the circulatory system: Secondary | ICD-10-CM | POA: Diagnosis not present

## 2021-08-13 DIAGNOSIS — I10 Essential (primary) hypertension: Secondary | ICD-10-CM | POA: Diagnosis not present

## 2021-08-13 DIAGNOSIS — F17200 Nicotine dependence, unspecified, uncomplicated: Secondary | ICD-10-CM | POA: Diagnosis not present

## 2021-08-13 DIAGNOSIS — I671 Cerebral aneurysm, nonruptured: Secondary | ICD-10-CM | POA: Diagnosis not present

## 2021-08-13 DIAGNOSIS — R002 Palpitations: Secondary | ICD-10-CM | POA: Diagnosis not present

## 2021-08-13 DIAGNOSIS — R0602 Shortness of breath: Secondary | ICD-10-CM | POA: Diagnosis not present

## 2021-08-13 DIAGNOSIS — I429 Cardiomyopathy, unspecified: Secondary | ICD-10-CM | POA: Diagnosis not present

## 2021-08-13 DIAGNOSIS — I700 Atherosclerosis of aorta: Secondary | ICD-10-CM | POA: Diagnosis not present

## 2021-08-13 DIAGNOSIS — I493 Ventricular premature depolarization: Secondary | ICD-10-CM | POA: Diagnosis not present

## 2021-08-13 DIAGNOSIS — I208 Other forms of angina pectoris: Secondary | ICD-10-CM | POA: Diagnosis not present

## 2021-10-06 ENCOUNTER — Other Ambulatory Visit: Payer: Self-pay | Admitting: Family Medicine

## 2021-10-06 NOTE — Telephone Encounter (Signed)
Medication Refill - Medication: Eszopiclone 3 MG TABS  Has the patient contacted their pharmacy? Yes.     Preferred Pharmacy (with phone number or street name):  [id number] - [place], [place] - [address] AT [facility] OF [address] [facility] Phone:  [phone]  Fax:  [phone]     Has the patient been seen for an appointment in the last year OR does the patient have an upcoming appointment? Yes.    Patient has 3 days left of her prescription. Please assist patient further.

## 2021-10-07 NOTE — Telephone Encounter (Signed)
Requested medication (s) are due for refill today - yes  Requested medication (s) are on the active medication list -yes  Future visit scheduled -yes  Last refill: 08/08/21 #30 2RF  Notes to clinic: non delegated Rx  Requested Prescriptions  Pending Prescriptions Disp Refills   Eszopiclone 3 MG TABS 30 tablet 2    Sig: TAKE 1 TABLET BY MOUTH IMMEDIATELY BEFORE BEDTIME. MUST LAST 30 DAYS     Not Delegated - Psychiatry:  Anxiolytics/Hypnotics Failed - 10/06/2021  2:06 PM      Failed - This refill cannot be delegated      Failed - Urine Drug Screen completed in last 360 days      Passed - Valid encounter within last 6 months    Recent Outpatient Visits           2 months ago [facility] to [employer] preventive visit   [facility], Megan P, DO   5 months ago Hypertension, unspecified type   [facility], Megan P, DO   8 months ago Primary insomnia   [facility] [place], [place], DO   11 months ago Needs flu shot   [facility], [place], DO   1 year ago Primary insomnia   [facility] [place], [place], DO       Future Appointments             In 3 weeks Johnson, Megan P, DO [facility], [facility]               Requested Prescriptions  Pending Prescriptions Disp Refills   Eszopiclone 3 MG TABS 30 tablet 2    Sig: TAKE 1 TABLET BY MOUTH IMMEDIATELY BEFORE BEDTIME. MUST LAST 30 DAYS     Not Delegated - Psychiatry:  Anxiolytics/Hypnotics Failed - 10/06/2021  2:06 PM      Failed - This refill cannot be delegated      Failed - Urine Drug Screen completed in last 360 days      Passed - Valid encounter within last 6 months    Recent Outpatient Visits           2 months ago [facility] [employer] preventive visit   [facility], Megan P, DO   5 months ago Hypertension, unspecified type   [facility] [place], Megan P, DO   8 months ago Primary  insomnia   [facility] [place], [place], DO   11 months ago Needs flu shot   [facility], [place], DO   1 year ago Primary insomnia   [facility] [place], [place], DO       Future Appointments             In 3 weeks Laural Benes, Oralia Rud, DO [facility], [facility]

## 2021-10-28 ENCOUNTER — Encounter: Payer: Self-pay | Admitting: Family Medicine

## 2021-10-28 ENCOUNTER — Ambulatory Visit (INDEPENDENT_AMBULATORY_CARE_PROVIDER_SITE_OTHER): Payer: PPO | Admitting: Family Medicine

## 2021-10-28 VITALS — BP 148/101 | HR 89 | Temp 98.3°F | Wt 119.6 lb

## 2021-10-28 DIAGNOSIS — I10 Essential (primary) hypertension: Secondary | ICD-10-CM | POA: Diagnosis not present

## 2021-10-28 DIAGNOSIS — F5101 Primary insomnia: Secondary | ICD-10-CM

## 2021-10-28 DIAGNOSIS — I739 Peripheral vascular disease, unspecified: Secondary | ICD-10-CM

## 2021-10-28 DIAGNOSIS — R413 Other amnesia: Secondary | ICD-10-CM

## 2021-10-28 MED ORDER — ATENOLOL 25 MG PO TABS: 12.5000 mg | ORAL_TABLET | Freq: Every day | ORAL | 2 refills | Status: DC

## 2021-10-28 MED ORDER — ESZOPICLONE 3 MG PO TABS
3.0000 mg | ORAL_TABLET | Freq: Every day | ORAL | 2 refills | Status: DC
Start: 2021-10-28 — End: 2022-01-23

## 2021-10-28 NOTE — Progress Notes (Signed)
BP (!) 148/101   Pulse 89   Temp 98.3 F (36.8 C)   Wt 119 lb 9.6 oz (54.3 kg)   LMP  (LMP Unknown)   SpO2 99%   BMI 20.42 kg/m    Subjective:    Patient ID: Jasmine Camacho, female    DOB: 1955-11-18, 66 y.o.   MRN: [id number]  HPI: Jasmine Camacho is a 66 y.o. female  Chief Complaint  Patient presents with   Insomnia   Pain    Patient states she has been having pain in both her legs, having trouble walking. Pain is worse when walking, requesting handicap placard.    INSOMNIA Duration: chronic Satisfied with sleep quality: yes Difficulty falling asleep: no Difficulty staying asleep: no Waking a few hours after sleep onset: no Early morning awakenings: no Daytime hypersomnolence: no Wakes feeling refreshed: no Good sleep hygiene: no Apnea: no Snoring: no Depressed/anxious mood: no Recent stress: yes Restless legs/nocturnal leg cramps: no Chronic pain/arthritis: no History of sleep study: yes Treatments attempted:  Lunesta, melatonin, uinsom, benadryl, and ambien   HYPERTENSION- has not been taking her carvedilol due to side effects.  Hypertension status: uncontrolled  Satisfied with current treatment? no Duration of hypertension: chronic BP monitoring frequency:  rarely BP medication side effects:  no Medication compliance: excellent compliance Previous BP meds: lasix, losartan, carvedilol Aspirin: no Recurrent headaches: no Visual changes: no Palpitations: no Dyspnea: no Chest pain: no Lower extremity edema: no Dizzy/lightheaded: no  Daughter has brought up concerns about her mood and her memory. Jasmine Camacho and her husband feel like she is doing OK. She denies any problems and does not want any medication.      10/28/2021   11:37 AM  MMSE - Mini Mental State Exam  Orientation to time 5  Orientation to Place 5  Registration 3  Attention/ Calculation 5  Recall 2  Language- name 2 objects 2  Language- repeat 1  Language- follow 3 step command 3   Language- read & follow direction 1  Write a sentence 1  Copy design 1  Total score 29   Jasmine Camacho notes that her legs have been hurting a lot. She notes that when she is walking, she will have cramping and then it will resolve when she sits for a while. She thinks that it's from her aneurysms. She has seen vascular, but she did not have any ABIs as they were very concerned about her aneurysms. She would like to get back into see them. No other concerns or complaints at this time.   Relevant past medical, surgical, family and social history reviewed and updated as indicated. Interim medical history since our last visit reviewed. Allergies and medications reviewed and updated.  Review of Systems  Constitutional: Negative.   Respiratory: Negative.    Cardiovascular: Negative.   Gastrointestinal: Negative.   Musculoskeletal: Negative.   Skin: Negative.   Neurological: Negative.   Psychiatric/Behavioral:  Positive for sleep disturbance. Negative for agitation, behavioral problems, confusion, decreased concentration, dysphoric mood, hallucinations, self-injury and suicidal ideas. The patient is not nervous/anxious and is not hyperactive.     Per HPI unless specifically indicated above     Objective:    BP (!) 148/101   Pulse 89   Temp 98.3 F (36.8 C)   Wt 119 lb 9.6 oz (54.3 kg)   LMP  (LMP Unknown)   SpO2 99%   BMI 20.42 kg/m   Wt Readings from Last 3 Encounters:  10/28/21 119 lb 9.6  oz (54.3 kg)  08/08/21 124 lb (56.2 kg)  08/04/21 123 lb (55.8 kg)    Physical Exam Vitals and nursing note reviewed.  Constitutional:      General: She is not in acute distress.    Appearance: Normal appearance. She is not ill-appearing, toxic-appearing or diaphoretic.  HENT:     Head: Normocephalic and atraumatic.     Right Ear: External ear normal.     Left Ear: External ear normal.     Nose: Nose normal.     Mouth/Throat:     Mouth: Mucous membranes are moist.     Pharynx: Oropharynx is  clear.  Eyes:     General: No scleral icterus.       Right eye: No discharge.        Left eye: No discharge.     Extraocular Movements: Extraocular movements intact.     Conjunctiva/sclera: Conjunctivae normal.     Pupils: Pupils are equal, round, and reactive to light.  Cardiovascular:     Rate and Rhythm: Normal rate and regular rhythm.     Pulses: Normal pulses.     Heart sounds: Normal heart sounds. No murmur heard.    No friction rub. No gallop.  Pulmonary:     Effort: Pulmonary effort is normal. No respiratory distress.     Breath sounds: Normal breath sounds. No stridor. No wheezing, rhonchi or rales.  Chest:     Chest wall: No tenderness.  Musculoskeletal:        General: Normal range of motion.     Cervical back: Normal range of motion and neck supple.  Skin:    General: Skin is warm and dry.     Capillary Refill: Capillary refill takes less than 2 seconds.     Coloration: Skin is not jaundiced or pale.     Findings: No bruising, erythema, lesion or rash.  Neurological:     General: No focal deficit present.     Mental Status: She is alert and oriented to person, place, and time. Mental status is at baseline.  Psychiatric:        Mood and Affect: Mood normal.        Behavior: Behavior normal.        Thought Content: Thought content normal.        Judgment: Judgment normal.     Results for orders placed or performed in visit on 08/08/21  CBC with Differential/Platelet  Result Value Ref Range   WBC 5.7 3.4 - 10.8 x10E3/uL   RBC 3.90 3.77 - 5.28 x10E6/uL   Hemoglobin 11.9 11.1 - 15.9 g/dL   Hematocrit 35.1 34.0 - 46.6 %   MCV 90 79 - 97 fL   MCH 30.5 26.6 - 33.0 pg   MCHC 33.9 31.5 - 35.7 g/dL   RDW 14.1 11.7 - 15.4 %   Platelets 199 150 - 450 x10E3/uL   Neutrophils 61 Not Estab. %   Lymphs 19 Not Estab. %   Monocytes 11 Not Estab. %   Eos 8 Not Estab. %   Basos 1 Not Estab. %   Neutrophils Absolute 3.5 1.4 - 7.0 x10E3/uL   Lymphocytes Absolute 1.1 0.7 -  3.1 x10E3/uL   Monocytes Absolute 0.6 0.1 - 0.9 x10E3/uL   EOS (ABSOLUTE) 0.5 (H) 0.0 - 0.4 x10E3/uL   Basophils Absolute 0.1 0.0 - 0.2 x10E3/uL   Immature Granulocytes 0 Not Estab. %   Immature Grans (Abs) 0.0 0.0 - 0.1 x10E3/uL  Comprehensive metabolic  panel  Result Value Ref Range   Glucose 90 70 - 99 mg/dL   BUN 19 8 - 27 mg/dL   Creatinine, Ser 0.86 0.57 - 1.00 mg/dL   eGFR 75 >59 mL/min/1.73   BUN/Creatinine Ratio 22 12 - 28   Sodium 140 134 - 144 mmol/L   Potassium 5.2 3.5 - 5.2 mmol/L   Chloride 104 96 - 106 mmol/L   CO2 23 20 - 29 mmol/L   Calcium 9.4 8.7 - 10.3 mg/dL   Total Protein 6.5 6.0 - 8.5 g/dL   Albumin 3.9 3.8 - 4.8 g/dL   Globulin, Total 2.6 1.5 - 4.5 g/dL   Albumin/Globulin Ratio 1.5 1.2 - 2.2   Bilirubin Total 0.5 0.0 - 1.2 mg/dL   Alkaline Phosphatase 124 (H) 44 - 121 IU/L   AST 19 0 - 40 IU/L   ALT 21 0 - 32 IU/L  Lipid Panel w/o Chol/HDL Ratio  Result Value Ref Range   Cholesterol, Total 110 100 - 199 mg/dL   Triglycerides 48 0 - 149 mg/dL   HDL 42 >39 mg/dL   VLDL Cholesterol Cal 12 5 - 40 mg/dL   LDL Chol Calc (NIH) 56 0 - 99 mg/dL  TSH  Result Value Ref Range   TSH 4.320 0.450 - 4.500 uIU/mL  Bayer DCA Hb A1c Waived  Result Value Ref Range   HB A1C (BAYER DCA - WAIVED) 5.4 4.8 - 5.6 %      Assessment & Plan:   Problem List Items Addressed This Visit       Cardiovascular and Mediastinum   Hypertension    Will try atenolol rather than carvedilol. Recheck 1 month. Call with any concerns.       Relevant Medications   atenolol (TENORMIN) 25 MG tablet     Other   Insomnia - Primary    Under good control on current regimen. Continue current regimen. Continue to monitor. Call with any concerns. Refills given for 3 months. Follow up in 3 months.        Intermittent claudication (HCC)    Concern for claudication. Will get her back into vascular for ABIs.       Relevant Orders   Ambulatory referral to Vascular Surgery   Other Visit  Diagnoses     Functional memory problem       MMSE normal. She thinks it's more of an issue with word finding rather than memory. She is otherwise doing well.         Follow up plan: Return in about 4 weeks (around 11/25/2021).

## 2021-10-30 ENCOUNTER — Telehealth: Payer: Self-pay | Admitting: Family Medicine

## 2021-10-30 NOTE — Telephone Encounter (Signed)
Form was signed yesterday- should be all set

## 2021-10-30 NOTE — Telephone Encounter (Signed)
Placed in folder for signature

## 2021-10-30 NOTE — Telephone Encounter (Signed)
Copied from [place] [phone]. Topic: General - Other >> Oct 30, 2021  9:33 AM Sabas Sous wrote: Reason for CRM: Pt's husband wants to be contacted when handicap placard form is complete, please advise Best contact: [phone]

## 2021-10-31 NOTE — Telephone Encounter (Signed)
Patient husband was called and notified form is ready for pick up.

## 2021-11-03 ENCOUNTER — Ambulatory Visit: Payer: PPO | Admitting: Family Medicine

## 2021-11-04 NOTE — Telephone Encounter (Signed)
Patient husband called to see if new handicap form is ready to be picked up.

## 2021-11-05 ENCOUNTER — Encounter: Payer: Self-pay | Admitting: Family Medicine

## 2021-11-05 DIAGNOSIS — I739 Peripheral vascular disease, unspecified: Secondary | ICD-10-CM | POA: Insufficient documentation

## 2021-11-05 NOTE — Assessment & Plan Note (Signed)
Concern for claudication. Will get her back into vascular for ABIs.

## 2021-11-05 NOTE — Telephone Encounter (Signed)
Called patient husband to notify handicap form is ready for pick up

## 2021-11-05 NOTE — Assessment & Plan Note (Signed)
Under good control on current regimen. Continue current regimen. Continue to monitor. Call with any concerns. Refills given for 3 months. Follow up in 3 months.   

## 2021-11-05 NOTE — Assessment & Plan Note (Signed)
Will try atenolol rather than carvedilol. Recheck 1 month. Call with any concerns.

## 2021-11-11 ENCOUNTER — Encounter: Payer: Self-pay | Admitting: Family Medicine

## 2021-11-13 DIAGNOSIS — I10 Essential (primary) hypertension: Secondary | ICD-10-CM | POA: Diagnosis not present

## 2021-11-13 DIAGNOSIS — I2089 Other forms of angina pectoris: Secondary | ICD-10-CM | POA: Diagnosis not present

## 2021-11-13 DIAGNOSIS — I700 Atherosclerosis of aorta: Secondary | ICD-10-CM | POA: Diagnosis not present

## 2021-11-13 DIAGNOSIS — R0602 Shortness of breath: Secondary | ICD-10-CM | POA: Diagnosis not present

## 2021-11-13 DIAGNOSIS — I429 Cardiomyopathy, unspecified: Secondary | ICD-10-CM | POA: Diagnosis not present

## 2021-11-13 DIAGNOSIS — Z8679 Personal history of other diseases of the circulatory system: Secondary | ICD-10-CM | POA: Diagnosis not present

## 2021-11-13 DIAGNOSIS — I671 Cerebral aneurysm, nonruptured: Secondary | ICD-10-CM | POA: Diagnosis not present

## 2021-11-13 DIAGNOSIS — I714 Abdominal aortic aneurysm, without rupture, unspecified: Secondary | ICD-10-CM | POA: Diagnosis not present

## 2021-11-13 DIAGNOSIS — I493 Ventricular premature depolarization: Secondary | ICD-10-CM | POA: Diagnosis not present

## 2021-11-13 DIAGNOSIS — F17200 Nicotine dependence, unspecified, uncomplicated: Secondary | ICD-10-CM | POA: Diagnosis not present

## 2021-11-13 DIAGNOSIS — R002 Palpitations: Secondary | ICD-10-CM | POA: Diagnosis not present

## 2021-11-27 ENCOUNTER — Encounter: Payer: Self-pay | Admitting: Family Medicine

## 2021-11-27 ENCOUNTER — Ambulatory Visit (INDEPENDENT_AMBULATORY_CARE_PROVIDER_SITE_OTHER): Payer: PPO | Admitting: Family Medicine

## 2021-11-27 VITALS — BP 130/90 | HR 80 | Wt 125.2 lb

## 2021-11-27 DIAGNOSIS — I10 Essential (primary) hypertension: Secondary | ICD-10-CM

## 2021-11-27 DIAGNOSIS — Z23 Encounter for immunization: Secondary | ICD-10-CM | POA: Diagnosis not present

## 2021-11-27 NOTE — Assessment & Plan Note (Signed)
BP much better. Unclear if she was taking both atenolol and metoprolol at the same time, but I suspect that she was. Will stop atenolol. Bottle kept today and will cancel Rx at the pharmacy. Will make sure she is tolerating her metoprolol on Monday without balance problems and dizziness. If she is tolerating it well- will continue.

## 2021-11-27 NOTE — Progress Notes (Signed)
BP (!) 130/90   Pulse 80   Wt 125 lb 3.2 oz (56.8 kg)   LMP  (LMP Unknown)   SpO2 99%   BMI 21.37 kg/m    Subjective:    Patient ID: Jasmine Camacho, female    DOB: 12-28-1955, 66 y.o.   MRN: [id number]  HPI: Jasmine Camacho is a 66 y.o. female  Chief Complaint  Patient presents with   Hypertension    Patient is unsure about what medications to be taking for BP, has been taking atenelol 12.34m and carvedilol.    HYPERTENSION- cardiology put her on metoprolol about 2 weeks ago. She is not sure if she was taking metoprolol and atenolol at the same time, but she says she stopped the atenolol 2-3 days ago Hypertension status: better  Satisfied with current treatment? yes Duration of hypertension: chronic BP monitoring frequency:  rarely BP medication side effects:  no Medication compliance: excellent compliance Aspirin: no Recurrent headaches: no Visual changes: no Palpitations: no Dyspnea: no Chest pain: no Lower extremity edema: no Dizzy/lightheaded: yes  Relevant past medical, surgical, family and social history reviewed and updated as indicated. Interim medical history since our last visit reviewed. Allergies and medications reviewed and updated.  Review of Systems  Constitutional: Negative.   HENT: Negative.    Respiratory: Negative.    Cardiovascular: Negative.   Gastrointestinal:  Positive for nausea. Negative for abdominal distention, abdominal pain, anal bleeding, blood in stool, constipation, diarrhea, rectal pain and vomiting.  Neurological:  Positive for dizziness and weakness. Negative for tremors, seizures, syncope, facial asymmetry, speech difficulty, light-headedness, numbness and headaches.  Psychiatric/Behavioral: Negative.      Per HPI unless specifically indicated above     Objective:    BP (!) 130/90   Pulse 80   Wt 125 lb 3.2 oz (56.8 kg)   LMP  (LMP Unknown)   SpO2 99%   BMI 21.37 kg/m   Wt Readings from Last 3 Encounters:  11/27/21 125  lb 3.2 oz (56.8 kg)  10/28/21 119 lb 9.6 oz (54.3 kg)  08/08/21 124 lb (56.2 kg)    Physical Exam Vitals and nursing note reviewed.  Constitutional:      General: She is not in acute distress.    Appearance: Normal appearance. She is normal weight. She is not ill-appearing, toxic-appearing or diaphoretic.  HENT:     Head: Normocephalic and atraumatic.     Right Ear: External ear normal.     Left Ear: External ear normal.     Nose: Nose normal.     Mouth/Throat:     Mouth: Mucous membranes are moist.     Pharynx: Oropharynx is clear.  Eyes:     General: No scleral icterus.       Right eye: No discharge.        Left eye: No discharge.     Extraocular Movements: Extraocular movements intact.     Conjunctiva/sclera: Conjunctivae normal.     Pupils: Pupils are equal, round, and reactive to light.  Cardiovascular:     Rate and Rhythm: Normal rate and regular rhythm.     Pulses: Normal pulses.     Heart sounds: Normal heart sounds. No murmur heard.    No friction rub. No gallop.  Pulmonary:     Effort: Pulmonary effort is normal. No respiratory distress.     Breath sounds: Normal breath sounds. No stridor. No wheezing, rhonchi or rales.  Chest:     Chest wall: No  tenderness.  Musculoskeletal:        General: Normal range of motion.     Cervical back: Normal range of motion and neck supple.  Skin:    General: Skin is warm and dry.     Capillary Refill: Capillary refill takes less than 2 seconds.     Coloration: Skin is not jaundiced or pale.     Findings: No bruising, erythema, lesion or rash.  Neurological:     General: No focal deficit present.     Mental Status: She is alert and oriented to person, place, and time. Mental status is at baseline.  Psychiatric:        Mood and Affect: Mood normal.        Behavior: Behavior normal.        Thought Content: Thought content normal.        Judgment: Judgment normal.     Results for orders placed or performed in visit on  08/08/21  CBC with Differential/Platelet  Result Value Ref Range   WBC 5.7 3.4 - 10.8 x10E3/uL   RBC 3.90 3.77 - 5.28 x10E6/uL   Hemoglobin 11.9 11.1 - 15.9 g/dL   Hematocrit 35.1 34.0 - 46.6 %   MCV 90 79 - 97 fL   MCH 30.5 26.6 - 33.0 pg   MCHC 33.9 31.5 - 35.7 g/dL   RDW 14.1 11.7 - 15.4 %   Platelets 199 150 - 450 x10E3/uL   Neutrophils 61 Not Estab. %   Lymphs 19 Not Estab. %   Monocytes 11 Not Estab. %   Eos 8 Not Estab. %   Basos 1 Not Estab. %   Neutrophils Absolute 3.5 1.4 - 7.0 x10E3/uL   Lymphocytes Absolute 1.1 0.7 - 3.1 x10E3/uL   Monocytes Absolute 0.6 0.1 - 0.9 x10E3/uL   EOS (ABSOLUTE) 0.5 (H) 0.0 - 0.4 x10E3/uL   Basophils Absolute 0.1 0.0 - 0.2 x10E3/uL   Immature Granulocytes 0 Not Estab. %   Immature Grans (Abs) 0.0 0.0 - 0.1 x10E3/uL  Comprehensive metabolic panel  Result Value Ref Range   Glucose 90 70 - 99 mg/dL   BUN 19 8 - 27 mg/dL   Creatinine, Ser 0.86 0.57 - 1.00 mg/dL   eGFR 75 >59 mL/min/1.73   BUN/Creatinine Ratio 22 12 - 28   Sodium 140 134 - 144 mmol/L   Potassium 5.2 3.5 - 5.2 mmol/L   Chloride 104 96 - 106 mmol/L   CO2 23 20 - 29 mmol/L   Calcium 9.4 8.7 - 10.3 mg/dL   Total Protein 6.5 6.0 - 8.5 g/dL   Albumin 3.9 3.8 - 4.8 g/dL   Globulin, Total 2.6 1.5 - 4.5 g/dL   Albumin/Globulin Ratio 1.5 1.2 - 2.2   Bilirubin Total 0.5 0.0 - 1.2 mg/dL   Alkaline Phosphatase 124 (H) 44 - 121 IU/L   AST 19 0 - 40 IU/L   ALT 21 0 - 32 IU/L  Lipid Panel w/o Chol/HDL Ratio  Result Value Ref Range   Cholesterol, Total 110 100 - 199 mg/dL   Triglycerides 48 0 - 149 mg/dL   HDL 42 >39 mg/dL   VLDL Cholesterol Cal 12 5 - 40 mg/dL   LDL Chol Calc (NIH) 56 0 - 99 mg/dL  TSH  Result Value Ref Range   TSH 4.320 0.450 - 4.500 uIU/mL  Bayer DCA Hb A1c Waived  Result Value Ref Range   HB A1C (BAYER DCA - WAIVED) 5.4 4.8 - 5.6 %  Assessment & Plan:   Problem List Items Addressed This Visit       Cardiovascular and Mediastinum   Hypertension  - Primary    BP much better. Unclear if she was taking both atenolol and metoprolol at the same time, but I suspect that she was. Will stop atenolol. Bottle kept today and will cancel Rx at the pharmacy. Will make sure she is tolerating her metoprolol on Monday without balance problems and dizziness. If she is tolerating it well- will continue.       Relevant Medications   metoprolol succinate (TOPROL-XL) 25 MG 24 hr tablet   Other Visit Diagnoses     Need for influenza vaccination       Relevant Orders   Flu Vaccine QUAD High Dose(Fluad) (Completed)        Follow up plan: Return in about 2 months (around 01/26/2022) for follow up.

## 2021-11-27 NOTE — Patient Instructions (Signed)
Take the metoprolol 1/2 tablet daily until Monday- We'll call you to see if your balance/dizziness/nausea is worse back on it.

## 2021-12-01 ENCOUNTER — Telehealth: Payer: Self-pay | Admitting: Family Medicine

## 2021-12-01 NOTE — Telephone Encounter (Signed)
Can we please check in on how she's feeling on her metoprolol? Looking to see if she is having any off balance or severe dizziness

## 2021-12-01 NOTE — Telephone Encounter (Signed)
Pt's husband called back bu the office was at lunch.  She was dizzy and nauseated yesterday,    CB@  [phone]

## 2021-12-01 NOTE — Telephone Encounter (Signed)
-----   Message from Valerie Roys, DO sent at 11/27/2021  1:50 PM EDT ----- Check in on how she's feeling on metoprolol

## 2021-12-01 NOTE — Telephone Encounter (Signed)
Called patient and spoke to husband, patient is currently sleeping will call back.

## 2021-12-03 DIAGNOSIS — E0500 Thyrotoxicosis with diffuse goiter without thyrotoxic crisis or storm: Secondary | ICD-10-CM | POA: Diagnosis not present

## 2021-12-03 NOTE — Telephone Encounter (Signed)
I would have her try taking it a little longer. If she continues to feel sick she should call her cardiologist and let him know. Thanks.

## 2021-12-04 NOTE — Telephone Encounter (Signed)
Patient husband, ronnie aware of provider advise and recommendation. No further questions.

## 2021-12-08 ENCOUNTER — Encounter (INDEPENDENT_AMBULATORY_CARE_PROVIDER_SITE_OTHER): Payer: Self-pay

## 2021-12-15 ENCOUNTER — Encounter (INDEPENDENT_AMBULATORY_CARE_PROVIDER_SITE_OTHER): Payer: PPO

## 2021-12-15 ENCOUNTER — Ambulatory Visit (INDEPENDENT_AMBULATORY_CARE_PROVIDER_SITE_OTHER): Payer: PPO | Admitting: Vascular Surgery

## 2021-12-15 DIAGNOSIS — E0500 Thyrotoxicosis with diffuse goiter without thyrotoxic crisis or storm: Secondary | ICD-10-CM | POA: Diagnosis not present

## 2021-12-29 ENCOUNTER — Encounter (INDEPENDENT_AMBULATORY_CARE_PROVIDER_SITE_OTHER): Payer: PPO

## 2021-12-29 ENCOUNTER — Ambulatory Visit (INDEPENDENT_AMBULATORY_CARE_PROVIDER_SITE_OTHER): Payer: PPO | Admitting: Vascular Surgery

## 2022-01-22 ENCOUNTER — Other Ambulatory Visit: Payer: Self-pay | Admitting: Family Medicine

## 2022-01-22 NOTE — Telephone Encounter (Signed)
Refused Atenolol 25 mg because it was discontinued on 11/27/2021.

## 2022-01-23 ENCOUNTER — Encounter: Payer: Self-pay | Admitting: Family Medicine

## 2022-01-23 ENCOUNTER — Telehealth: Payer: Self-pay

## 2022-01-23 ENCOUNTER — Telehealth: Payer: Self-pay | Admitting: Family Medicine

## 2022-01-23 ENCOUNTER — Ambulatory Visit (INDEPENDENT_AMBULATORY_CARE_PROVIDER_SITE_OTHER): Payer: PPO | Admitting: Family Medicine

## 2022-01-23 VITALS — BP 128/88 | HR 83 | Temp 97.9°F | Ht 64.0 in | Wt 113.0 lb

## 2022-01-23 DIAGNOSIS — R296 Repeated falls: Secondary | ICD-10-CM

## 2022-01-23 DIAGNOSIS — F5101 Primary insomnia: Secondary | ICD-10-CM

## 2022-01-23 DIAGNOSIS — F419 Anxiety disorder, unspecified: Secondary | ICD-10-CM | POA: Diagnosis not present

## 2022-01-23 MED ORDER — ESZOPICLONE 3 MG PO TABS: 3.0000 mg | ORAL_TABLET | Freq: Every day | ORAL | 2 refills | Status: DC

## 2022-01-23 NOTE — Telephone Encounter (Signed)
Spoke with patient's husband and notified him that the order was faxed to [facility] at [phone]. Patient husband verbalized understanding.

## 2022-01-23 NOTE — Progress Notes (Signed)
BP 128/88 (BP Location: Left Arm, Cuff Size: Normal)   Pulse 83   Temp 97.9 F (36.6 C) (Oral)   Ht _0  (1.626 m)   Wt 113 lb (51.3 kg)   LMP  (LMP Unknown)   SpO2 100%   BMI 19.40 kg/m    Subjective:    Patient ID: Jasmine Camacho, female    DOB: 1955-02-19, 66 y.o.   MRN: [id number]  HPI: STEPHNIE PARLIER is a 66 y.o. female  Chief Complaint  Patient presents with   Hypertension   Insomnia   She stopped all her medicine and has been adding her meds back in. She thinks that she is taking all her medicine again.   INSOMNIA Duration: chronic Satisfied with sleep quality: yes Difficulty falling asleep: no Difficulty staying asleep: no Waking a few hours after sleep onset: no Early morning awakenings: no Daytime hypersomnolence: no Wakes feeling refreshed: no Good sleep hygiene: no Apnea: no Snoring: no Depressed/anxious mood: yes Recent stress: no Restless legs/nocturnal leg cramps: no Chronic pain/arthritis: no History of sleep study: no Treatments attempted: melatonin, uinsom, benadryl, and ambien   ANXIETY/STRESS Duration: chronic Status:better Anxious mood: no  Excessive worrying: no Irritability: no  Sweating: no Nausea: no Palpitations:no Hyperventilation: no Panic attacks: no Agoraphobia: no  Obscessions/compulsions: no Depressed mood: no    01/23/2022   10:43 AM 11/27/2021   11:03 AM 10/28/2021   11:09 AM 08/08/2021   10:35 AM 05/09/2021   11:10 AM  Depression screen PHQ 2/9  Decreased Interest 0 0 0 0 0  Down, Depressed, Hopeless 0 1 0 0 0  PHQ - 2 Score 0 1 0 0 0  Altered sleeping 0 0 2 0 1  Tired, decreased energy _1 Change in appetite 1 0 1 0 0  Feeling bad or failure about yourself  0 0 0 0 0  Trouble concentrating 0 0 0 0 0  Moving slowly or fidgety/restless 0 _2 Suicidal thoughts 0 0 0 0 0  PHQ-9 Score _3 Difficult doing work/chores Somewhat difficult Somewhat difficult Somewhat difficult Not difficult at all     Anhedonia: no Weight changes: no Insomnia: yes   Hypersomnia: no Fatigue/loss of energy: no Feelings of worthlessness: no Feelings of guilt: no Impaired concentration/indecisiveness: no Suicidal ideations: no  Crying spells: no Recent Stressors/Life Changes: no   Relationship problems: no   Family stress: no     Financial stress: no    Job stress: no    Recent death/loss: no  Has been falling more. Needs rolator. Balance is not good.  Relevant past medical, surgical, family and social history reviewed and updated as indicated. Interim medical history since our last visit reviewed. Allergies and medications reviewed and updated.  Review of Systems  Constitutional: Negative.   Respiratory: Negative.    Cardiovascular: Negative.   Gastrointestinal: Negative.   Musculoskeletal: Negative.   Neurological: Negative.   Psychiatric/Behavioral: Negative.      Per HPI unless specifically indicated above     Objective:    BP 128/88 (BP Location: Left Arm, Cuff Size: Normal)   Pulse 83   Temp 97.9 F (36.6 C) (Oral)   Ht _4  (1.626 m)   Wt 113 lb (51.3 kg)   LMP  (LMP Unknown)   SpO2 100%   BMI 19.40 kg/m   Wt Readings from Last 3 Encounters:  01/23/22 113 lb (51.3 kg)  11/27/21 125 lb 3.2 oz (56.8 kg)  10/28/21 119 lb 9.6 oz (54.3 kg)    Physical Exam Vitals and nursing note reviewed.  Constitutional:      General: She is not in acute distress.    Appearance: Normal appearance. She is normal weight. She is not ill-appearing, toxic-appearing or diaphoretic.  HENT:     Head: Normocephalic and atraumatic.     Right Ear: External ear normal.     Left Ear: External ear normal.     Nose: Nose normal.     Mouth/Throat:     Mouth: Mucous membranes are moist.     Pharynx: Oropharynx is clear.  Eyes:     General: No scleral icterus.       Right eye: No discharge.        Left eye: No discharge.     Extraocular Movements: Extraocular movements intact.      Conjunctiva/sclera: Conjunctivae normal.     Pupils: Pupils are equal, round, and reactive to light.  Cardiovascular:     Rate and Rhythm: Normal rate and regular rhythm.     Pulses: Normal pulses.     Heart sounds: Normal heart sounds. No murmur heard.    No friction rub. No gallop.  Pulmonary:     Effort: Pulmonary effort is normal. No respiratory distress.     Breath sounds: Normal breath sounds. No stridor. No wheezing, rhonchi or rales.  Chest:     Chest wall: No tenderness.  Musculoskeletal:        General: Normal range of motion.     Cervical back: Normal range of motion and neck supple.  Skin:    General: Skin is warm and dry.     Capillary Refill: Capillary refill takes less than 2 seconds.     Coloration: Skin is not jaundiced or pale.     Findings: No bruising, erythema, lesion or rash.  Neurological:     General: No focal deficit present.     Mental Status: She is alert and oriented to person, place, and time. Mental status is at baseline.  Psychiatric:        Mood and Affect: Mood normal.        Behavior: Behavior normal.        Thought Content: Thought content normal.        Judgment: Judgment normal.     Results for orders placed or performed in visit on 08/08/21  CBC with Differential/Platelet  Result Value Ref Range   WBC 5.7 3.4 - 10.8 x10E3/uL   RBC 3.90 3.77 - 5.28 x10E6/uL   Hemoglobin 11.9 11.1 - 15.9 g/dL   Hematocrit 35.1 34.0 - 46.6 %   MCV 90 79 - 97 fL   MCH 30.5 26.6 - 33.0 pg   MCHC 33.9 31.5 - 35.7 g/dL   RDW 14.1 11.7 - 15.4 %   Platelets 199 150 - 450 x10E3/uL   Neutrophils 61 Not Estab. %   Lymphs 19 Not Estab. %   Monocytes 11 Not Estab. %   Eos 8 Not Estab. %   Basos 1 Not Estab. %   Neutrophils Absolute 3.5 1.4 - 7.0 x10E3/uL   Lymphocytes Absolute 1.1 0.7 - 3.1 x10E3/uL   Monocytes Absolute 0.6 0.1 - 0.9 x10E3/uL   EOS (ABSOLUTE) 0.5 (H) 0.0 - 0.4 x10E3/uL   Basophils Absolute 0.1 0.0 - 0.2 x10E3/uL   Immature Granulocytes 0  Not Estab. %   Immature Grans (Abs) 0.0 0.0 - 0.1  x10E3/uL  Comprehensive metabolic panel  Result Value Ref Range   Glucose 90 70 - 99 mg/dL   BUN 19 8 - 27 mg/dL   Creatinine, Ser 0.86 0.57 - 1.00 mg/dL   eGFR 75 >59 mL/min/1.73   BUN/Creatinine Ratio 22 12 - 28   Sodium 140 134 - 144 mmol/L   Potassium 5.2 3.5 - 5.2 mmol/L   Chloride 104 96 - 106 mmol/L   CO2 23 20 - 29 mmol/L   Calcium 9.4 8.7 - 10.3 mg/dL   Total Protein 6.5 6.0 - 8.5 g/dL   Albumin 3.9 3.8 - 4.8 g/dL   Globulin, Total 2.6 1.5 - 4.5 g/dL   Albumin/Globulin Ratio 1.5 1.2 - 2.2   Bilirubin Total 0.5 0.0 - 1.2 mg/dL   Alkaline Phosphatase 124 (H) 44 - 121 IU/L   AST 19 0 - 40 IU/L   ALT 21 0 - 32 IU/L  Lipid Panel w/o Chol/HDL Ratio  Result Value Ref Range   Cholesterol, Total 110 100 - 199 mg/dL   Triglycerides 48 0 - 149 mg/dL   HDL 42 >39 mg/dL   VLDL Cholesterol Cal 12 5 - 40 mg/dL   LDL Chol Calc (NIH) 56 0 - 99 mg/dL  TSH  Result Value Ref Range   TSH 4.320 0.450 - 4.500 uIU/mL  Bayer DCA Hb A1c Waived  Result Value Ref Range   HB A1C (BAYER DCA - WAIVED) 5.4 4.8 - 5.6 %      Assessment & Plan:   Problem List Items Addressed This Visit       Other   Insomnia - Primary    Under good control on current regimen. Continue current regimen. Continue to monitor. Call with any concerns. Refills given for 3 months. Follow up 3 months.        Anxiety    Doing great on her celexa. Stable. Feeling much better. Call with any concerns.       Other Visit Diagnoses     Frequent falls       Will order rolator. Call with any concerns. Continue to monitor.   Relevant Orders   For home use only DME 4 wheeled rolling walker with seat        Follow up plan: Return in about 3 months (around 04/23/2022).

## 2022-01-23 NOTE — Telephone Encounter (Signed)
-----   Message from Megan P Johnson, DO sent at 01/23/2022 10:52 AM EST ----- Order for rollator please dx: frequent falls  

## 2022-01-23 NOTE — Assessment & Plan Note (Signed)
Doing great on her celexa. Stable. Feeling much better. Call with any concerns.

## 2022-01-23 NOTE — Telephone Encounter (Signed)
DME Order has been placed and order is in provider's folder for signature.

## 2022-01-23 NOTE — Telephone Encounter (Signed)
Copied from CRM [id number]. Topic: General - Other >> Jan 23, 2022 12:02 PM Everette C wrote: Reason for CRM: The patient's husband would like to be notified when the order for the rollator walker will be submitted to [facility] Chrystie Nose would prefer the order to be faxed to them at [phone] Attention Cara  Please contact the patient further when possible

## 2022-01-23 NOTE — Telephone Encounter (Signed)
-----   Message from Dorcas Carrow, DO sent at 01/23/2022 10:52 AM EST ----- Order for rollator please dx: frequent falls

## 2022-01-23 NOTE — Assessment & Plan Note (Signed)
Under good control on current regimen. Continue current regimen. Continue to monitor. Call with any concerns. Refills given for 3 months. Follow up 3 months.    

## 2022-01-27 ENCOUNTER — Ambulatory Visit: Payer: PPO | Admitting: Family Medicine

## 2022-01-27 DIAGNOSIS — R269 Unspecified abnormalities of gait and mobility: Secondary | ICD-10-CM | POA: Diagnosis not present

## 2022-04-02 DIAGNOSIS — E785 Hyperlipidemia, unspecified: Secondary | ICD-10-CM | POA: Diagnosis not present

## 2022-04-02 DIAGNOSIS — Z8679 Personal history of other diseases of the circulatory system: Secondary | ICD-10-CM | POA: Diagnosis not present

## 2022-04-02 DIAGNOSIS — I429 Cardiomyopathy, unspecified: Secondary | ICD-10-CM | POA: Diagnosis not present

## 2022-04-02 DIAGNOSIS — F17200 Nicotine dependence, unspecified, uncomplicated: Secondary | ICD-10-CM | POA: Diagnosis not present

## 2022-04-02 DIAGNOSIS — I5022 Chronic systolic (congestive) heart failure: Secondary | ICD-10-CM | POA: Diagnosis not present

## 2022-04-02 DIAGNOSIS — I671 Cerebral aneurysm, nonruptured: Secondary | ICD-10-CM | POA: Diagnosis not present

## 2022-04-02 DIAGNOSIS — I10 Essential (primary) hypertension: Secondary | ICD-10-CM | POA: Diagnosis not present

## 2022-04-02 DIAGNOSIS — J449 Chronic obstructive pulmonary disease, unspecified: Secondary | ICD-10-CM | POA: Diagnosis not present

## 2022-04-02 DIAGNOSIS — I700 Atherosclerosis of aorta: Secondary | ICD-10-CM | POA: Diagnosis not present

## 2022-04-02 DIAGNOSIS — I714 Abdominal aortic aneurysm, without rupture, unspecified: Secondary | ICD-10-CM | POA: Diagnosis not present

## 2022-04-18 ENCOUNTER — Other Ambulatory Visit: Payer: Self-pay | Admitting: Family Medicine

## 2022-04-20 NOTE — Telephone Encounter (Signed)
Requested Prescriptions  Pending Prescriptions Disp Refills   omeprazole (PRILOSEC) 20 MG capsule [Pharmacy Med Name: OMEPRAZOLE '20MG'$  CAPSULES] 90 capsule 0    Sig: TAKE 1 CAPSULE(20 MG) BY MOUTH DAILY     Gastroenterology: Proton Pump Inhibitors Passed - 04/18/2022  7:05 AM      Passed - Valid encounter within last 12 months    Recent Outpatient Visits           2 months ago Primary insomnia   [place], [place], DO   4 months ago Primary hypertension   [place], [place], DO   5 months ago Primary insomnia   [place], [place], DO   8 months ago [employer] [facility] [employer] preventive visit   [place], DO   11 months ago Hypertension, unspecified type   [place], [place], DO       Future Appointments             In 4 days Valerie Roys, DO [place]

## 2022-04-24 ENCOUNTER — Encounter: Payer: Self-pay | Admitting: Family Medicine

## 2022-04-24 ENCOUNTER — Ambulatory Visit (INDEPENDENT_AMBULATORY_CARE_PROVIDER_SITE_OTHER): Payer: PPO | Admitting: Family Medicine

## 2022-04-24 VITALS — BP 142/82 | HR 91 | Temp 98.4°F | Ht 64.0 in | Wt 113.2 lb

## 2022-04-24 DIAGNOSIS — F5101 Primary insomnia: Secondary | ICD-10-CM

## 2022-04-24 DIAGNOSIS — I10 Essential (primary) hypertension: Secondary | ICD-10-CM

## 2022-04-24 DIAGNOSIS — I700 Atherosclerosis of aorta: Secondary | ICD-10-CM

## 2022-04-24 DIAGNOSIS — F419 Anxiety disorder, unspecified: Secondary | ICD-10-CM | POA: Diagnosis not present

## 2022-04-24 MED ORDER — MONTELUKAST SODIUM 10 MG PO TABS: 10.0000 mg | ORAL_TABLET | Freq: Every evening | ORAL | 3 refills | Status: DC | PRN

## 2022-04-24 MED ORDER — FLUTICASONE-SALMETEROL 100-50 MCG/ACT IN AEPB: 1.0000 | INHALATION_SPRAY | Freq: Two times a day (BID) | RESPIRATORY_TRACT | 3 refills | Status: DC

## 2022-04-24 MED ORDER — ESZOPICLONE 3 MG PO TABS: 3.0000 mg | ORAL_TABLET | Freq: Every day | ORAL | 2 refills | Status: DC

## 2022-04-24 MED ORDER — PRAVASTATIN SODIUM 20 MG PO TABS: 20.0000 mg | ORAL_TABLET | ORAL | 0 refills | Status: DC

## 2022-04-24 MED ORDER — ALBUTEROL SULFATE HFA 108 (90 BASE) MCG/ACT IN AERS: INHALATION_SPRAY | RESPIRATORY_TRACT | 6 refills | Status: DC

## 2022-04-24 MED ORDER — METOPROLOL SUCCINATE ER 25 MG PO TB24: 12.5000 mg | ORAL_TABLET | Freq: Every day | ORAL | 1 refills | Status: DC

## 2022-04-24 NOTE — Assessment & Plan Note (Signed)
Has been doing well. Self d/c'd her celexa. Continue to monitor. Call with any concerns.

## 2022-04-24 NOTE — Assessment & Plan Note (Signed)
Will keep BP and cholesterol under good control. Not willing to take both her pravastatin and metoprolol daily. Will change her pravastatin to 3x a week and continue metoprolol daily. Call with any concerns. Recheck next visit at physical.

## 2022-04-24 NOTE — Progress Notes (Signed)
BP (!) 142/82   Pulse 91   Temp 98.4 F (36.9 C) (Oral)   Ht 5\' 4"  (1.626 m)   Wt 113 lb 3.2 oz (51.3 kg)   LMP  (LMP Unknown)   SpO2 98%   BMI 19.43 kg/m    Subjective:    Patient ID: Jasmine Camacho, female    DOB: 1955/02/23, 67 y.o.   MRN: [id number]  HPI: Jasmine Camacho is a 67 y.o. female  Chief Complaint  Patient presents with   Anxiety   Insomnia   HYPERTENSION / HYPERLIPIDEMIA- supposed to be taking metoprolol, feels like she has too many pills so she self d/c'd this Satisfied with current treatment? yes Duration of hypertension: chronic BP monitoring frequency: not checking BP medication side effects: no Duration of hyperlipidemia: chronic Cholesterol medication side effects: yes Cholesterol supplements: none Past cholesterol medications: crestor- discontinued due to intolerance. Pravastatin started Medication compliance: fair compliance Aspirin: no Recent stressors: no Recurrent headaches: no Visual changes: no Palpitations: no Dyspnea: no Chest pain: no Lower extremity edema: no Dizzy/lightheaded: no  INSOMNIA Duration: chronic Satisfied with sleep quality: yes Difficulty falling asleep: no Difficulty staying asleep: no Waking a few hours after sleep onset: no Early morning awakenings: no Daytime hypersomnolence: no Wakes feeling refreshed: no Good sleep hygiene: no Apnea: no Snoring: no Depressed/anxious mood: no Recent stress: no Restless legs/nocturnal leg cramps: no Chronic pain/arthritis: no History of sleep study: no Treatments attempted: melatonin, uinsom, benadryl, and ambien    Relevant past medical, surgical, family and social history reviewed and updated as indicated. Interim medical history since our last visit reviewed. Allergies and medications reviewed and updated.  Review of Systems  Constitutional: Negative.   Respiratory: Negative.    Cardiovascular: Negative.   Gastrointestinal: Negative.   Musculoskeletal: Negative.    Neurological: Negative.   Psychiatric/Behavioral: Negative.      Per HPI unless specifically indicated above     Objective:    BP (!) 142/82   Pulse 91   Temp 98.4 F (36.9 C) (Oral)   Ht 5\' 4"  (1.626 m)   Wt 113 lb 3.2 oz (51.3 kg)   LMP  (LMP Unknown)   SpO2 98%   BMI 19.43 kg/m   Wt Readings from Last 3 Encounters:  04/24/22 113 lb 3.2 oz (51.3 kg)  01/23/22 113 lb (51.3 kg)  11/27/21 125 lb 3.2 oz (56.8 kg)    Physical Exam Vitals and nursing note reviewed.  Constitutional:      General: She is not in acute distress.    Appearance: Normal appearance. She is not ill-appearing, toxic-appearing or diaphoretic.  HENT:     Head: Normocephalic and atraumatic.     Right Ear: External ear normal.     Left Ear: External ear normal.     Nose: Nose normal.     Mouth/Throat:     Mouth: Mucous membranes are moist.     Pharynx: Oropharynx is clear.  Eyes:     General: No scleral icterus.       Right eye: No discharge.        Left eye: No discharge.     Extraocular Movements: Extraocular movements intact.     Conjunctiva/sclera: Conjunctivae normal.     Pupils: Pupils are equal, round, and reactive to light.  Cardiovascular:     Rate and Rhythm: Normal rate and regular rhythm.     Pulses: Normal pulses.     Heart sounds: Normal heart sounds. No murmur heard.  No friction rub. No gallop.  Pulmonary:     Effort: Pulmonary effort is normal. No respiratory distress.     Breath sounds: Normal breath sounds. No stridor. No wheezing, rhonchi or rales.  Chest:     Chest wall: No tenderness.  Musculoskeletal:        General: Normal range of motion.     Cervical back: Normal range of motion and neck supple.  Skin:    General: Skin is warm and dry.     Capillary Refill: Capillary refill takes less than 2 seconds.     Coloration: Skin is not jaundiced or pale.     Findings: No bruising, erythema, lesion or rash.  Neurological:     General: No focal deficit present.      Mental Status: She is alert and oriented to person, place, and time. Mental status is at baseline.  Psychiatric:        Mood and Affect: Mood normal.        Behavior: Behavior normal.        Thought Content: Thought content normal.        Judgment: Judgment normal.     Results for orders placed or performed in visit on 08/08/21  CBC with Differential/Platelet  Result Value Ref Range   WBC 5.7 3.4 - 10.8 x10E3/uL   RBC 3.90 3.77 - 5.28 x10E6/uL   Hemoglobin 11.9 11.1 - 15.9 g/dL   Hematocrit 35.1 34.0 - 46.6 %   MCV 90 79 - 97 fL   MCH 30.5 26.6 - 33.0 pg   MCHC 33.9 31.5 - 35.7 g/dL   RDW 14.1 11.7 - 15.4 %   Platelets 199 150 - 450 x10E3/uL   Neutrophils 61 Not Estab. %   Lymphs 19 Not Estab. %   Monocytes 11 Not Estab. %   Eos 8 Not Estab. %   Basos 1 Not Estab. %   Neutrophils Absolute 3.5 1.4 - 7.0 x10E3/uL   Lymphocytes Absolute 1.1 0.7 - 3.1 x10E3/uL   Monocytes Absolute 0.6 0.1 - 0.9 x10E3/uL   EOS (ABSOLUTE) 0.5 (H) 0.0 - 0.4 x10E3/uL   Basophils Absolute 0.1 0.0 - 0.2 x10E3/uL   Immature Granulocytes 0 Not Estab. %   Immature Grans (Abs) 0.0 0.0 - 0.1 x10E3/uL  Comprehensive metabolic panel  Result Value Ref Range   Glucose 90 70 - 99 mg/dL   BUN 19 8 - 27 mg/dL   Creatinine, Ser 0.86 0.57 - 1.00 mg/dL   eGFR 75 >59 mL/min/1.73   BUN/Creatinine Ratio 22 12 - 28   Sodium 140 134 - 144 mmol/L   Potassium 5.2 3.5 - 5.2 mmol/L   Chloride 104 96 - 106 mmol/L   CO2 23 20 - 29 mmol/L   Calcium 9.4 8.7 - 10.3 mg/dL   Total Protein 6.5 6.0 - 8.5 g/dL   Albumin 3.9 3.8 - 4.8 g/dL   Globulin, Total 2.6 1.5 - 4.5 g/dL   Albumin/Globulin Ratio 1.5 1.2 - 2.2   Bilirubin Total 0.5 0.0 - 1.2 mg/dL   Alkaline Phosphatase 124 (H) 44 - 121 IU/L   AST 19 0 - 40 IU/L   ALT 21 0 - 32 IU/L  Lipid Panel w/o Chol/HDL Ratio  Result Value Ref Range   Cholesterol, Total 110 100 - 199 mg/dL   Triglycerides 48 0 - 149 mg/dL   HDL 42 >39 mg/dL   VLDL Cholesterol Cal 12 5 - 40  mg/dL   LDL Chol Calc (NIH) 56  0 - 99 mg/dL  TSH  Result Value Ref Range   TSH 4.320 0.450 - 4.500 uIU/mL  Bayer DCA Hb A1c Waived  Result Value Ref Range   HB A1C (BAYER DCA - WAIVED) 5.4 4.8 - 5.6 %      Assessment & Plan:   Problem List Items Addressed This Visit       Cardiovascular and Mediastinum   Hypertension   Relevant Medications   metoprolol succinate (TOPROL-XL) 25 MG 24 hr tablet   pravastatin (PRAVACHOL) 20 MG tablet   Aortic atherosclerosis (HCC)    Will keep BP and cholesterol under good control. Not willing to take both her pravastatin and metoprolol daily. Will change her pravastatin to 3x a week and continue metoprolol daily. Call with any concerns. Recheck next visit at physical.       Relevant Medications   metoprolol succinate (TOPROL-XL) 25 MG 24 hr tablet   pravastatin (PRAVACHOL) 20 MG tablet     Other   Insomnia - Primary    Under good control on current regimen. Continue current regimen. Continue to monitor. Call with any concerns. Refills given for 3 months. Follow up 3 months.        Anxiety    Has been doing well. Self d/c'd her celexa. Continue to monitor. Call with any concerns.         Follow up plan: Return in about 3 months (around 07/25/2022) for phyiscal/wellness.

## 2022-04-24 NOTE — Assessment & Plan Note (Signed)
Under good control on current regimen. Continue current regimen. Continue to monitor. Call with any concerns. Refills given for 3 months. Follow up 3 months.    

## 2022-06-23 ENCOUNTER — Ambulatory Visit (INDEPENDENT_AMBULATORY_CARE_PROVIDER_SITE_OTHER): Payer: PPO | Admitting: Family Medicine

## 2022-06-23 ENCOUNTER — Other Ambulatory Visit (INDEPENDENT_AMBULATORY_CARE_PROVIDER_SITE_OTHER): Payer: Self-pay | Admitting: Nurse Practitioner

## 2022-06-23 ENCOUNTER — Encounter: Payer: Self-pay | Admitting: Family Medicine

## 2022-06-23 VITALS — BP 160/109 | HR 62 | Temp 97.9°F | Wt 116.1 lb

## 2022-06-23 DIAGNOSIS — R0989 Other specified symptoms and signs involving the circulatory and respiratory systems: Secondary | ICD-10-CM | POA: Diagnosis not present

## 2022-06-23 DIAGNOSIS — L819 Disorder of pigmentation, unspecified: Secondary | ICD-10-CM

## 2022-06-23 DIAGNOSIS — I739 Peripheral vascular disease, unspecified: Secondary | ICD-10-CM

## 2022-06-23 MED ORDER — HYDROCODONE-ACETAMINOPHEN 10-325 MG PO TABS: 1.0000 | ORAL_TABLET | Freq: Three times a day (TID) | ORAL | 0 refills | Status: AC | PRN

## 2022-06-23 NOTE — Patient Instructions (Addendum)
You are scheduled to go to [facility] for an ultrasound tomorrow, 06/24/22, at 11:00 AM and then a follow up office visit with them on Friday 06/26/22 at 11:00 AM.   Address: [address], [place], [place] [address] Phone Number: [phone]

## 2022-06-23 NOTE — Progress Notes (Unsigned)
BP (!) 160/109   Pulse 62   Temp 97.9 F (36.6 C) (Oral)   Wt 116 lb 1.6 oz (52.7 kg)   LMP  (LMP Unknown)   SpO2 98%   BMI 19.93 kg/m    Subjective:    Patient ID: Jasmine Camacho, female    DOB: 16-Nov-1955, 67 y.o.   MRN: [id number]  HPI: Jasmine Camacho is a 67 y.o. female  Chief Complaint  Patient presents with   Foot Swelling   [name] comes in today with a couple of day history of pain in both her feet. She notes that they have been swelling, the color has been different- they are looking more reddish purple and they have been hurting a lot. She denies any changes or injury. She notes that both feet are bothering her the same. She follows with vascular, but it's been almost a year since she's seen them. She is otherwise feeling well with no other concerns or complaints at this time.   Relevant past medical, surgical, family and social history reviewed and updated as indicated. Interim medical history since our last visit reviewed. Allergies and medications reviewed and updated.  Review of Systems  Constitutional: Negative.   Respiratory: Negative.    Cardiovascular: Negative.   Gastrointestinal: Negative.   Musculoskeletal:  Positive for gait problem and myalgias. Negative for arthralgias, back pain, joint swelling, neck pain and neck stiffness.  Skin:  Positive for color change. Negative for pallor, rash and wound.  Psychiatric/Behavioral: Negative.      Per HPI unless specifically indicated above     Objective:    BP (!) 160/109   Pulse 62   Temp 97.9 F (36.6 C) (Oral)   Wt 116 lb 1.6 oz (52.7 kg)   LMP  (LMP Unknown)   SpO2 98%   BMI 19.93 kg/m   Wt Readings from Last 3 Encounters:  06/23/22 116 lb 1.6 oz (52.7 kg)  04/24/22 113 lb 3.2 oz (51.3 kg)  01/23/22 113 lb (51.3 kg)    Physical Exam Vitals and nursing note reviewed.  Constitutional:      General: She is not in acute distress.    Appearance: Normal appearance. She is not ill-appearing,  toxic-appearing or diaphoretic.  HENT:     Head: Normocephalic and atraumatic.     Right Ear: External ear normal.     Left Ear: External ear normal.     Nose: Nose normal.     Mouth/Throat:     Mouth: Mucous membranes are moist.     Pharynx: Oropharynx is clear.  Eyes:     General: No scleral icterus.       Right eye: No discharge.        Left eye: No discharge.     Extraocular Movements: Extraocular movements intact.     Conjunctiva/sclera: Conjunctivae normal.     Pupils: Pupils are equal, round, and reactive to light.  Cardiovascular:     Rate and Rhythm: Normal rate and regular rhythm.     Pulses: Normal pulses.     Heart sounds: Normal heart sounds. No murmur heard.    No friction rub. No gallop.  Pulmonary:     Effort: Pulmonary effort is normal. No respiratory distress.     Breath sounds: Normal breath sounds. No stridor. No wheezing, rhonchi or rales.  Chest:     Chest wall: No tenderness.  Musculoskeletal:        General: Normal range of motion.  Cervical back: Normal range of motion and neck supple.  Skin:    General: Skin is dry.     Coloration: Skin is not jaundiced or pale.     Findings: No bruising, erythema, lesion or rash.     Comments: Dusky colored feet bilaterally, decreased pulses on the L pedal and posterior tibial, normal on the R  Neurological:     General: No focal deficit present.     Mental Status: She is alert and oriented to person, place, and time. Mental status is at baseline.  Psychiatric:        Mood and Affect: Mood normal.        Behavior: Behavior normal.        Thought Content: Thought content normal.        Judgment: Judgment normal.     Results for orders placed or performed in visit on 08/08/21  CBC with Differential/Platelet  Result Value Ref Range   WBC 5.7 3.4 - 10.8 x10E3/uL   RBC 3.90 3.77 - 5.28 x10E6/uL   Hemoglobin 11.9 11.1 - 15.9 g/dL   Hematocrit 16.1 09.6 - 46.6 %   MCV 90 79 - 97 fL   MCH 30.5 26.6 - 33.0 pg    MCHC 33.9 31.5 - 35.7 g/dL   RDW 04.5 40.9 - 81.1 %   Platelets 199 150 - 450 x10E3/uL   Neutrophils 61 Not Estab. %   Lymphs 19 Not Estab. %   Monocytes 11 Not Estab. %   Eos 8 Not Estab. %   Basos 1 Not Estab. %   Neutrophils Absolute 3.5 1.4 - 7.0 x10E3/uL   Lymphocytes Absolute 1.1 0.7 - 3.1 x10E3/uL   Monocytes Absolute 0.6 0.1 - 0.9 x10E3/uL   EOS (ABSOLUTE) 0.5 (H) 0.0 - 0.4 x10E3/uL   Basophils Absolute 0.1 0.0 - 0.2 x10E3/uL   Immature Granulocytes 0 Not Estab. %   Immature Grans (Abs) 0.0 0.0 - 0.1 x10E3/uL  Comprehensive metabolic panel  Result Value Ref Range   Glucose 90 70 - 99 mg/dL   BUN 19 8 - 27 mg/dL   Creatinine, Ser 9.14 0.57 - 1.00 mg/dL   eGFR 75 >78 GN/FAO/1.30   BUN/Creatinine Ratio 22 12 - 28   Sodium 140 134 - 144 mmol/L   Potassium 5.2 3.5 - 5.2 mmol/L   Chloride 104 96 - 106 mmol/L   CO2 23 20 - 29 mmol/L   Calcium 9.4 8.7 - 10.3 mg/dL   Total Protein 6.5 6.0 - 8.5 g/dL   Albumin 3.9 3.8 - 4.8 g/dL   Globulin, Total 2.6 1.5 - 4.5 g/dL   Albumin/Globulin Ratio 1.5 1.2 - 2.2   Bilirubin Total 0.5 0.0 - 1.2 mg/dL   Alkaline Phosphatase 124 (H) 44 - 121 IU/L   AST 19 0 - 40 IU/L   ALT 21 0 - 32 IU/L  Lipid Panel w/o Chol/HDL Ratio  Result Value Ref Range   Cholesterol, Total 110 100 - 199 mg/dL   Triglycerides 48 0 - 149 mg/dL   HDL 42 >86 mg/dL   VLDL Cholesterol Cal 12 5 - 40 mg/dL   LDL Chol Calc (NIH) 56 0 - 99 mg/dL  TSH  Result Value Ref Range   TSH 4.320 0.450 - 4.500 uIU/mL  Bayer DCA Hb A1c Waived  Result Value Ref Range   HB A1C (BAYER DCA - WAIVED) 5.4 4.8 - 5.6 %      Assessment & Plan:   Problem List Items Addressed  This Visit   None Visit Diagnoses     Decreased pulses in feet    -  Primary   Long history of vascular disease. Will get her into vascular ASAP. Appointment scheuduled for tomorrow. Discussed warning signs for ER. Pain meds for short time   Discoloration of skin of foot       Long history of vascular  disease. Will get her into vascular ASAP. Appointment scheuduled for tomorrow. Discussed warning signs for ER. Pain meds for short time        Follow up plan: Return if symptoms worsen or fail to improve.

## 2022-06-24 ENCOUNTER — Ambulatory Visit (INDEPENDENT_AMBULATORY_CARE_PROVIDER_SITE_OTHER): Payer: PPO

## 2022-06-24 ENCOUNTER — Encounter: Payer: Self-pay | Admitting: Family Medicine

## 2022-06-24 DIAGNOSIS — I739 Peripheral vascular disease, unspecified: Secondary | ICD-10-CM

## 2022-06-26 ENCOUNTER — Encounter (INDEPENDENT_AMBULATORY_CARE_PROVIDER_SITE_OTHER): Payer: Self-pay | Admitting: Nurse Practitioner

## 2022-06-26 ENCOUNTER — Ambulatory Visit (INDEPENDENT_AMBULATORY_CARE_PROVIDER_SITE_OTHER): Payer: PPO | Admitting: Nurse Practitioner

## 2022-06-26 VITALS — BP 189/136 | HR 65 | Resp 17 | Ht 64.0 in | Wt 117.4 lb

## 2022-06-26 DIAGNOSIS — I7025 Atherosclerosis of native arteries of other extremities with ulceration: Secondary | ICD-10-CM | POA: Diagnosis not present

## 2022-06-26 DIAGNOSIS — F17200 Nicotine dependence, unspecified, uncomplicated: Secondary | ICD-10-CM

## 2022-06-26 DIAGNOSIS — I716 Thoracoabdominal aortic aneurysm, without rupture, unspecified: Secondary | ICD-10-CM | POA: Diagnosis not present

## 2022-06-26 LAB — VAS US ABI WITH/WO TBI
Left ABI: 0.79
Right ABI: 0.64

## 2022-06-26 NOTE — Progress Notes (Signed)
Subjective:    Patient ID: Jasmine Camacho, female    DOB: August 26, 1955, 67 y.o.   MRN: [id number] Chief Complaint  Patient presents with   Follow-up    Jasmine Camacho is a 67 year old female that returns today for evaluation of possible peripheral arterial disease.  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  Given this, the patient was referred to [facility] to be evaluated for a fenestrated stent graft by Dr. Pattricia Boss however she has elected not to move forward.  However she is recently began to have some discoloration and pain in her toes and lower extremities.  She notes that this has been ongoing for several months.  The patient underwent ABIs on 06/24/2022 which revealed a right ABI of 0.64 with a TBI 0.14.  The left has an ABI of 0.79 with a TBI of 0.  There are monophasic tibial artery waveforms bilaterally.  The patient has discoloration of her feet bilaterally with the worst being on the left.  The patient also has started to have the formation of a small ulceration on her left fourth toe.    Review of Systems  Skin:  Positive for color change and wound.  All other systems reviewed and are negative.      Objective:   Physical Exam Vitals reviewed.  HENT:     Head: Normocephalic.  Cardiovascular:     Rate and Rhythm: Normal rate.  Pulmonary:     Effort: Pulmonary effort is normal.  Musculoskeletal:       Feet:  Feet:     Right foot:     Toenail Condition: Fungal disease present.    Left foot:     Toenail Condition: Fungal disease present. Skin:    General: Skin is warm and dry.  Neurological:     Mental Status: She is alert. Mental status is at baseline.     Motor: Weakness present.  Psychiatric:        Mood and Affect: Mood normal.        Behavior: Behavior normal.        Thought Content: Thought content normal.        Judgment: Judgment normal.      BP (!) 189/136 (BP Location: Left Arm)   Pulse 65   Resp 17   Ht 5\' 4"  (1.626 m)   Wt 117 lb 6.4 oz (53.3 kg)   LMP  (LMP Unknown)   BMI 20.15 kg/m   Past Medical History:  Diagnosis Date   Acute respiratory failure (HCC) 11/17/2018   Aphasia    Hypertension    Insomnia    Menopausal symptom    Menorrhagia    Pneumonia    Seasonal allergies    Tachycardia    Thyroid disease    Ventricular premature contractions     Social History   Socioeconomic History   Marital status: Married    Spouse name: Not on file   Number of children: Not on file   Years of education: Not on file   Highest education level: Not on file  Occupational History   Not on file  Tobacco Use   Smoking status: Every Day    Packs/day: .25    Types: Cigarettes   Smokeless tobacco: Never  Vaping Use   Vaping Use: Never used  Substance and Sexual Activity   Alcohol use: No  Alcohol/week: 0.0 standard drinks of alcohol   Drug use: No   Sexual activity: Never  Other Topics Concern   Not on file  Social History Narrative   Not on file   Social Determinants of Health   Financial Resource Strain: Not on file  Food Insecurity: Not on file  Transportation Needs: Not on file  Physical Activity: Not on file  Stress: Not on file  Social Connections: Not on file  Intimate Partner Violence: Not on file    Past Surgical History:  Procedure Laterality Date   aneurysm surgery  09/2003   DILATION AND CURETTAGE OF UTERUS  2007    Family History  Problem Relation Age of Onset   Stroke Mother    Diabetes Mother    Heart disease Mother    Hypertension Mother    Alcohol abuse Father    Cancer Father        liver   Diabetes Brother    Hypertension Brother    Heart disease Brother    Cancer Brother        colon    Allergies  Allergen Reactions   Ambien [Zolpidem]    Asa [Aspirin]    Benazepril Hcl    Carvedilol Other (See Comments)    GI upset   Codeine Sulfate Nausea Only    Metoprolol Tartrate    Other Other (See Comments)    Valtura   Aldactone [Spironolactone] Other (See Comments)    Abdominal pain    Hctz [Hydrochlorothiazide] Rash       Latest Ref Rng & Units 08/08/2021   10:52 AM 05/09/2021   11:50 AM 08/21/2019   10:12 AM  CBC  WBC 3.4 - 10.8 x10E3/uL 5.7  6.5  6.9   Hemoglobin 11.1 - 15.9 g/dL 16.1  09.6  04.5   Hematocrit 34.0 - 46.6 % 35.1  38.4  34.8   Platelets 150 - 450 x10E3/uL 199  278  263       CMP     Component Value Date/Time   NA 140 08/08/2021 1052   K 5.2 08/08/2021 1052   CL 104 08/08/2021 1052   CO2 23 08/08/2021 1052   GLUCOSE 90 08/08/2021 1052   GLUCOSE 161 (H) 11/17/2018 1311   BUN 19 08/08/2021 1052   CREATININE 0.86 08/08/2021 1052   CALCIUM 9.4 08/08/2021 1052   PROT 6.5 08/08/2021 1052   ALBUMIN 3.9 08/08/2021 1052   AST 19 08/08/2021 1052   ALT 21 08/08/2021 1052   ALKPHOS 124 (H) 08/08/2021 1052   BILITOT 0.5 08/08/2021 1052   GFRNONAA 70 08/21/2019 1012   GFRAA 81 08/21/2019 1012     VAS [place] ABI WITH/WO TBI  Result Date: 06/26/2022  LOWER EXTREMITY DOPPLER STUDY Patient Name:  Jasmine Camacho  Date of Exam:   06/24/2022 Medical Rec #: [id number]      Accession #:    [id number] Date of Birth: Jul 05, 1955      Patient Gender: F Patient Age:   71 years Exam Location:  [facility] Procedure:      VAS [place] ABI WITH/WO TBI Referring Phys: Sheppard Plumber --------------------------------------------------------------------------------  Indications: Claudication, rest pain, and peripheral artery disease. Known              aneurysms High Risk Factors: Hypertension, current smoker.  Performing Technologist: Hardie Lora RVT  Examination Guidelines: A complete evaluation includes at minimum, Doppler waveform signals and systolic blood pressure reading at the level of bilateral brachial, anterior  tibial, and posterior tibial arteries, when vessel segments are accessible. Bilateral testing is considered an integral  part of a complete examination. Photoelectric Plethysmograph (PPG) waveforms and toe systolic pressure readings are included as required and additional duplex testing as needed. Limited examinations for reoccurring indications may be performed as noted.  ABI Findings: +---------+------------------+-----+----------+--------+ Right    Rt Pressure (mmHg)IndexWaveform  Comment  +---------+------------------+-----+----------+--------+ Brachial 176                                       +---------+------------------+-----+----------+--------+ PTA      112               0.64 monophasic         +---------+------------------+-----+----------+--------+ DP       78                0.44 monophasic         +---------+------------------+-----+----------+--------+ Great Toe24                0.14                    +---------+------------------+-----+----------+--------+ +---------+------------------+-----+----------+-------+ Left     Lt Pressure (mmHg)IndexWaveform  Comment +---------+------------------+-----+----------+-------+ Brachial 157                                      +---------+------------------+-----+----------+-------+ PTA      139               0.79 monophasic        +---------+------------------+-----+----------+-------+ DP       114               0.65 monophasic        +---------+------------------+-----+----------+-------+ Great Toe0                 0.00                   +---------+------------------+-----+----------+-------+ +-------+-----------+-----------+------------+------------+ ABI/TBIToday's ABIToday's TBIPrevious ABIPrevious TBI +-------+-----------+-----------+------------+------------+ Right  0.64       0.14                                +-------+-----------+-----------+------------+------------+ Left   0.79       0.00                                +-------+-----------+-----------+------------+------------+  Summary: Right:  Resting right ankle-brachial index indicates moderate right lower extremity arterial disease. The right toe-brachial index is abnormal. Left: Resting left ankle-brachial index indicates moderate left lower extremity arterial disease. The left toe-brachial index is abnormal. *See table(s) above for measurements and observations.  Electronically signed by Levora Dredge MD on 06/26/2022 at 8:32:57 AM.    Final        Assessment & Plan:   1. Atherosclerosis of native arteries of the extremities with ulceration (HCC)  Recommend:  The patient has evidence of severe atherosclerotic changes of both lower extremities associated with ulceration and tissue loss of the bilateral foot.  This represents a limb threatening ischemia and places the patient at the risk for bilateral limb loss.  Patient should undergo angiography of the left followed by the right lower extremity with the  hope for intervention for limb salvage.  The risks and benefits as well as the alternative therapies was discussed in detail with the patient.  All questions were answered.  Patient agrees to proceed with lower extremity angiography.  Patient has been advised that because both legs do have significant disease this procedures would be done on separate days.  The patient will follow up with me in the office after the procedure.   2. Thoracoabdominal aortic aneurysm (TAAA) without rupture, unspecified part (HCC) The patient was seen and evaluated by Dr. Pattricia Boss at [facility] in regards to a fenestrated stent graft.  Following discussion with Dr. Pattricia Boss they have elected not to move forward with intervention.  3. Tobacco dependence Smoking cessation was discussed, 3-10 minutes spent on this topic specifically   Current Outpatient Medications on File Prior to Visit  Medication Sig Dispense Refill   acetaminophen (TYLENOL) 650 MG CR tablet Take 650 mg by mouth every 8 (eight) hours as needed (pain or fever).     albuterol (VENTOLIN HFA)  108 (90 Base) MCG/ACT inhaler INL 2 PFS PO Q 4 TO 6 H PRN 18 g 6   Eszopiclone 3 MG TABS Take 1 tablet (3 mg total) by mouth at bedtime. TAKE 1 TABLET BY MOUTH IMMEDIATELY BEFORE BEDTIME. MUST LAST 30 DAYS 30 tablet 2   fluticasone-salmeterol (ADVAIR) 100-50 MCG/ACT AEPB Inhale 1 puff into the lungs 2 (two) times daily. 3 each 3   furosemide (LASIX) 20 MG tablet TAKE 2 TABLETS(40 MG) BY MOUTH DAILY 180 tablet 1   HYDROcodone-acetaminophen (NORCO) 10-325 MG tablet Take 1 tablet by mouth every 8 (eight) hours as needed for up to 5 days. 15 tablet 0   losartan (COZAAR) 100 MG tablet Take 100 mg by mouth daily.     methimazole (TAPAZOLE) 5 MG tablet Take 5 mg by mouth daily.      metoprolol succinate (TOPROL-XL) 25 MG 24 hr tablet Take 0.5 tablets (12.5 mg total) by mouth at bedtime. 90 tablet 1   montelukast (SINGULAIR) 10 MG tablet Take 1 tablet (10 mg total) by mouth at bedtime as needed (for allergies and stuffiness). TAKE 1 TABLET(10 MG) BY MOUTH AT BEDTIME 90 tablet 3   omeprazole (PRILOSEC) 20 MG capsule TAKE 1 CAPSULE(20 MG) BY MOUTH DAILY 90 capsule 0   ondansetron (ZOFRAN) 4 MG tablet Take 4 mg by mouth every 8 (eight) hours as needed.     pravastatin (PRAVACHOL) 20 MG tablet Take 1 tablet (20 mg total) by mouth every Monday, Wednesday, and Friday. 156 tablet 0   traMADol (ULTRAM) 50 MG tablet Take 50 mg by mouth every 6 (six) hours as needed. (Patient not taking: Reported on 06/26/2022)     No current facility-administered medications on file prior to visit.    There are no Patient Instructions on file for this visit. No follow-ups on file.   Georgiana Spinner, NP

## 2022-06-30 ENCOUNTER — Ambulatory Visit: Payer: Self-pay

## 2022-06-30 ENCOUNTER — Telehealth (INDEPENDENT_AMBULATORY_CARE_PROVIDER_SITE_OTHER): Payer: Self-pay

## 2022-06-30 ENCOUNTER — Other Ambulatory Visit (INDEPENDENT_AMBULATORY_CARE_PROVIDER_SITE_OTHER): Payer: Self-pay | Admitting: Nurse Practitioner

## 2022-06-30 MED ORDER — HYDROCODONE-ACETAMINOPHEN 5-325 MG PO TABS: 1.0000 | ORAL_TABLET | Freq: Four times a day (QID) | ORAL | 0 refills | Status: DC | PRN

## 2022-06-30 NOTE — Telephone Encounter (Signed)
Spoke with the patient's spouse and the patient is scheduled with Dr. Gilda Crease for bilateral le angio's on 07/14/22-6:45 am arrival and 07/21/22-6:45 am arrival to the [facility]. Pare-procedure instructions were discussed and will be sent to Mychart and mailed.

## 2022-06-30 NOTE — Telephone Encounter (Signed)
Spoke with patient's husband and made him aware of Dr Henriette Combs recommendations. Patient's husband says he spoke with the specialist office this morning and made them aware of patient symptoms and they told him to give the patient Tylenol or Ibuprofen or to take patient to the ER. Husband says they told him they have a cancellation, they would reach out to the patient. Please advise?

## 2022-06-30 NOTE — Telephone Encounter (Signed)
Message from Randol Kern sent at 06/30/2022  9:05 AM EDT  Summary: Swelling in feet, splitting in toes   Pt's husband called to report that the patient is scheduled to see the pain specialist on June 4th, however she is out of the hydrocodone prescribed by PCP on 06/23/2022. Pt is experiencing swelling in her feet and splitting in her toes.          Chief Complaint: requesting pain med Symptoms: severe feet pain and skin has slpit to toe pad to right middle toe, weeping fluid, redness to feet and legs Frequency: chronic  Pertinent Negatives: Patient denies difficulty breathing or fever Disposition: [] ED /[] Urgent Care (no appt availability in office) / [] Appointment(In office/virtual)/ []  [place] [facility]/ [] Home Care/ [x] Refused Recommended Disposition /[] [place] [facility]/ []  Follow-up with PCP Additional Notes: pt's husband refused appt - wants PCP to to triage stated was seen 1 week ago and stated "everything is the same except her split toe"  June 4 th and 11 th split open veins to assist feet toes starting to splitt and wanting more pain med - keep raised up [facility] refused to give pain med except to take Tylenol. Seen splitting . No blood flow to legs [facility] try to clear veins. June 4 th left  6/11 right leg.   Reason for Disposition  SEVERE leg swelling (e.g., swelling extends above knee, entire leg is swollen, weeping fluid)    Toe has split open and weeping fluid.  Answer Assessment - Initial Assessment Questions 1. ONSET: "When did the swelling start?" (e.g., minutes, hours, days)     chronic 2. LOCATION: "What part of the leg is swollen?"  "Are both legs swollen or just one leg?"     Both feet  3. SEVERITY: "How bad is the swelling?" (e.g., localized; mild, moderate, severe)   - Localized: Small area of swelling localized to one leg.   - MILD pedal edema: Swelling limited to foot and ankle, pitting edema < 1/4 inch (6 mm) deep, rest and elevation eliminate most or  all swelling.   - MODERATE edema: Swelling of lower leg to knee, pitting edema > 1/4 inch (6 mm) deep, rest and elevation only partially reduce swelling.   - SEVERE edema: Swelling extends above knee, facial or hand swelling present.      Severe moderate  4. REDNESS: "Does the swelling look red or infected?"     Yes- 5. PAIN: "Is the swelling painful to touch?" If Yes, ask: "How painful is it?"   (Scale 1-10; mild, moderate or severe)     severe 6. FEVER: "Do you have a fever?" If Yes, ask: "What is it, how was it measured, and when did it start?"      no 7. CAUSE: "What do you think is causing the leg swelling?"     Complications from AAA 8. MEDICAL HISTORY: "Do you have a history of blood clots (e.g., DVT), cancer, heart failure, kidney disease, or liver failure?"     No- has heart issues and HTN, blockage of circulation to feet and legs 9. RECURRENT SYMPTOM: "Have you had leg swelling before?" If Yes, ask: "When was the last time?" "What happened that time?"     Yes  10. OTHER SYMPTOMS: "Do you have any other symptoms?" (e.g., chest pain, difficulty breathing)       Skin split open to right middle toe on the bottom  11. PREGNANCY: "Is there any chance you are pregnant?" "When was  your last menstrual period?"       N/a  Protocols used: Leg Swelling and Edema-A-AH

## 2022-06-30 NOTE — Telephone Encounter (Signed)
Needs to call specialist

## 2022-06-30 NOTE — Telephone Encounter (Signed)
Spoke with the patient's spouse regarding getting scheduled for bilateral leg angio's with Dr. Gilda Crease. Patient's spouse stated the patient's foot is red and swollen and she is in a lot of pain and wants pain medication. Please advise.

## 2022-06-30 NOTE — Telephone Encounter (Signed)
Hydrocodone sent in

## 2022-07-07 ENCOUNTER — Ambulatory Visit (INDEPENDENT_AMBULATORY_CARE_PROVIDER_SITE_OTHER): Payer: PPO | Admitting: Nurse Practitioner

## 2022-07-07 ENCOUNTER — Encounter: Payer: Self-pay | Admitting: Nurse Practitioner

## 2022-07-07 ENCOUNTER — Ambulatory Visit: Payer: Self-pay

## 2022-07-07 ENCOUNTER — Telehealth (INDEPENDENT_AMBULATORY_CARE_PROVIDER_SITE_OTHER): Payer: Self-pay | Admitting: Nurse Practitioner

## 2022-07-07 VITALS — BP 180/146 | HR 88 | Temp 98.0°F | Wt 115.4 lb

## 2022-07-07 DIAGNOSIS — R0989 Other specified symptoms and signs involving the circulatory and respiratory systems: Secondary | ICD-10-CM | POA: Diagnosis not present

## 2022-07-07 DIAGNOSIS — E0500 Thyrotoxicosis with diffuse goiter without thyrotoxic crisis or storm: Secondary | ICD-10-CM | POA: Diagnosis not present

## 2022-07-07 MED ORDER — CEPHALEXIN 500 MG PO CAPS: 500.0000 mg | ORAL_CAPSULE | Freq: Four times a day (QID) | ORAL | 0 refills | Status: DC

## 2022-07-07 MED ORDER — HYDROCODONE-ACETAMINOPHEN 5-325 MG PO TABS: 1.0000 | ORAL_TABLET | Freq: Three times a day (TID) | ORAL | 0 refills | Status: DC

## 2022-07-07 NOTE — Telephone Encounter (Signed)
I would recommend that he take her to the ED vs. The walk in clinic.  There won't be any pain medication that I can send that will take away her pain.  This is because she has poor blood flow and it wont improve until we can help restore it

## 2022-07-07 NOTE — Telephone Encounter (Signed)
Message from Arther Dames sent at 07/07/2022  1:48 PM EDT  Summary: Toe Infection   Patients husband called to get an antibiotic for patient's toe infection.         Chief Complaint: 2 toes on both feet look infected- asking fro abx Symptoms: pain and redness Frequency: ongoing pt had splt on 1 toes last week Pertinent Negatives: Patient denies fever Disposition: [] ED /[] Urgent Care (no appt availability in office) / [x] Appointment(In office/virtual)/ []  [place] [facility]/ [] Home Care/ [] Refused Recommended Disposition /[] [place] [facility]/ []  Follow-up with PCP Additional Notes: in office appt today   Reason for Disposition . [1] Swollen toe AND [2] no fever  (Exceptions: Just a localized bump from bunion, corns, insect bite, sting.)  Answer Assessment - Initial Assessment Questions 1. ONSET: "When did the pain start?"      *No Answer* 2. LOCATION: "Where is the pain located?"   (e.g., around nail, entire toe, at foot joint)      Both midlle toe and foot  3. PAIN: "How bad is the pain?"    (Scale 1-10; or mild, moderate, severe)   -  MILD (1-3): doesn't interfere with normal activities    -  MODERATE (4-7): interferes with normal activities (e.g., work or school) or awakens from sleep, limping    -  SEVERE (8-10): excruciating pain, unable to do any normal activities, unable to walk     Spouse stated pain is severe keeping clean and neosprin pain orajel  4. APPEARANCE: "What does the toe look like?" (e.g., redness, swelling, bruising, pallor)     split 5. CAUSE: "What do you think is causing the toe pain?"     Poor circulation 6. OTHER SYMPTOMS: "Do you have any other symptoms?" (e.g., leg pain, rash, fever, numbness)     Purple toes  7. PREGNANCY: "Is there any chance you are pregnant?" "When was your last menstrual period?"     N/a  Protocols used: Toe Pain-A-AH

## 2022-07-07 NOTE — Telephone Encounter (Signed)
Pt's spouse called in stating that the pt's toes are turning blue, split open and oozing clear fluid. Pt's spouse is also requesting more pain medication - either something stronger or something to help get her out of pain. He states that he is taking her to the walk in clinic beside [facility] in a little bit but wants to know if he needs to do anything different. Please advise.

## 2022-07-07 NOTE — Progress Notes (Signed)
BP (!) 180/146   Pulse 88   Temp 98 F (36.7 C) (Oral)   Wt 115 lb 6.4 oz (52.3 kg)   LMP  (LMP Unknown)   SpO2 98%   BMI 19.81 kg/m    Subjective:    Patient ID: Jasmine Camacho, female    DOB: 17-Oct-1955, 66 y.o.   MRN: [id number]  HPI: Jasmine Camacho is a 67 y.o. female  Chief Complaint  Patient presents with   toe redness   TOE PAIN Duration: months Involved toe:  both4th and pinky toe  Mechanism of injury: unknown Onset: gradual Severity: 10/10  Quality: sharp, dull, aching, and burning Frequency: constant Radiation: no Aggravating factors: weight bearing  Alleviating factors:  hydrocodone   Status: worse Treatments attempted:  hydrocodone   Relief with NSAIDs?: No NSAIDs Taken Morning stiffness: no Redness: no  Bruising: no Swelling: yes Paresthesias / decreased sensation: yes Fevers: no  Relevant past medical, surgical, family and social history reviewed and updated as indicated. Interim medical history since our last visit reviewed. Allergies and medications reviewed and updated.  Review of Systems  Musculoskeletal:        Toe pain    Per HPI unless specifically indicated above     Objective:    BP (!) 180/146   Pulse 88   Temp 98 F (36.7 C) (Oral)   Wt 115 lb 6.4 oz (52.3 kg)   LMP  (LMP Unknown)   SpO2 98%   BMI 19.81 kg/m   Wt Readings from Last 3 Encounters:  07/07/22 115 lb 6.4 oz (52.3 kg)  06/26/22 117 lb 6.4 oz (53.3 kg)  06/23/22 116 lb 1.6 oz (52.7 kg)    Physical Exam Vitals and nursing note reviewed.  Constitutional:      General: She is not in acute distress.    Appearance: Normal appearance. She is normal weight. She is not ill-appearing, toxic-appearing or diaphoretic.  HENT:     Head: Normocephalic.     Right Ear: External ear normal.     Left Ear: External ear normal.     Nose: Nose normal.     Mouth/Throat:     Mouth: Mucous membranes are moist.     Pharynx: Oropharynx is clear.  Eyes:     General:         Right eye: No discharge.        Left eye: No discharge.     Extraocular Movements: Extraocular movements intact.     Conjunctiva/sclera: Conjunctivae normal.     Pupils: Pupils are equal, round, and reactive to light.  Cardiovascular:     Rate and Rhythm: Normal rate and regular rhythm.     Heart sounds: No murmur heard. Pulmonary:     Effort: Pulmonary effort is normal. No respiratory distress.     Breath sounds: Normal breath sounds. No wheezing or rales.  Musculoskeletal:     Cervical back: Normal range of motion and neck supple.     Right foot: Decreased range of motion.     Left foot: Decreased range of motion.       Feet:  Feet:     Right foot:     Toenail Condition: Right toenails are abnormally thick and long. Fungal disease present.    Left foot:     Toenail Condition: Left toenails are abnormally thick and long. Fungal disease present.    Comments: Discharge from 3rd, 4th, 5th toes under toenail on right foot.   Discharge  from 4th and 5th toe nails on left foot. Decreased pulses on L and R pedal  Skin:    General: Skin is warm and dry.     Capillary Refill: Capillary refill takes less than 2 seconds.  Neurological:     General: No focal deficit present.     Mental Status: She is alert and oriented to person, place, and time. Mental status is at baseline.  Psychiatric:        Mood and Affect: Mood normal.        Behavior: Behavior normal.        Thought Content: Thought content normal.        Judgment: Judgment normal.     Results for orders placed or performed in visit on 06/24/22  VAS [place] ABI WITH/WO TBI  Result Value Ref Range   Right ABI 0.64    Left ABI 0.79       Assessment & Plan:   Problem List Items Addressed This Visit   None Visit Diagnoses     Decreased pulses in feet    -  Primary Will treat with antibiotics and short supply of pain medication given. Recommend patient be seen in the ER due to left puple/black 4th toe. Keep appt with vascular.  If not improved needs to see vascular for further management and pain medication.         Follow up plan: Return if symptoms worsen or fail to improve.   A total of 30 minutes were spent on this encounter today.  When total time is documented, this includes both the face-to-face and non-face-to-face time personally spent before, during and after the visit on the date of the encounter symptoms, plan of care and follow up.

## 2022-07-08 ENCOUNTER — Emergency Department: Payer: PPO

## 2022-07-08 ENCOUNTER — Other Ambulatory Visit: Payer: Self-pay

## 2022-07-08 ENCOUNTER — Encounter: Payer: Self-pay | Admitting: Internal Medicine

## 2022-07-08 ENCOUNTER — Inpatient Hospital Stay
Admission: EM | Admit: 2022-07-08 | Discharge: 2022-07-10 | DRG: 300 | Discharge status: Against Medical Advice | Payer: PPO | Attending: Internal Medicine | Admitting: Internal Medicine

## 2022-07-08 DIAGNOSIS — I70222 Atherosclerosis of native arteries of extremities with rest pain, left leg: Secondary | ICD-10-CM | POA: Diagnosis not present

## 2022-07-08 DIAGNOSIS — I130 Hypertensive heart and chronic kidney disease with heart failure and stage 1 through stage 4 chronic kidney disease, or unspecified chronic kidney disease: Secondary | ICD-10-CM | POA: Diagnosis not present

## 2022-07-08 DIAGNOSIS — E0590 Thyrotoxicosis, unspecified without thyrotoxic crisis or storm: Secondary | ICD-10-CM | POA: Diagnosis present

## 2022-07-08 DIAGNOSIS — L089 Local infection of the skin and subcutaneous tissue, unspecified: Secondary | ICD-10-CM | POA: Diagnosis not present

## 2022-07-08 DIAGNOSIS — M2012 Hallux valgus (acquired), left foot: Secondary | ICD-10-CM | POA: Diagnosis not present

## 2022-07-08 DIAGNOSIS — J302 Other seasonal allergic rhinitis: Secondary | ICD-10-CM | POA: Diagnosis not present

## 2022-07-08 DIAGNOSIS — N182 Chronic kidney disease, stage 2 (mild): Secondary | ICD-10-CM | POA: Diagnosis not present

## 2022-07-08 DIAGNOSIS — I70221 Atherosclerosis of native arteries of extremities with rest pain, right leg: Secondary | ICD-10-CM | POA: Diagnosis not present

## 2022-07-08 DIAGNOSIS — Z885 Allergy status to narcotic agent status: Secondary | ICD-10-CM

## 2022-07-08 DIAGNOSIS — R4701 Aphasia: Secondary | ICD-10-CM | POA: Diagnosis present

## 2022-07-08 DIAGNOSIS — E785 Hyperlipidemia, unspecified: Secondary | ICD-10-CM | POA: Diagnosis not present

## 2022-07-08 DIAGNOSIS — E876 Hypokalemia: Secondary | ICD-10-CM | POA: Diagnosis not present

## 2022-07-08 DIAGNOSIS — I716 Thoracoabdominal aortic aneurysm, without rupture, unspecified: Secondary | ICD-10-CM | POA: Diagnosis present

## 2022-07-08 DIAGNOSIS — I70223 Atherosclerosis of native arteries of extremities with rest pain, bilateral legs: Secondary | ICD-10-CM

## 2022-07-08 DIAGNOSIS — F17210 Nicotine dependence, cigarettes, uncomplicated: Secondary | ICD-10-CM | POA: Diagnosis present

## 2022-07-08 DIAGNOSIS — I5022 Chronic systolic (congestive) heart failure: Secondary | ICD-10-CM | POA: Diagnosis present

## 2022-07-08 DIAGNOSIS — Z8673 Personal history of transient ischemic attack (TIA), and cerebral infarction without residual deficits: Secondary | ICD-10-CM | POA: Diagnosis not present

## 2022-07-08 DIAGNOSIS — Z7951 Long term (current) use of inhaled steroids: Secondary | ICD-10-CM | POA: Diagnosis not present

## 2022-07-08 DIAGNOSIS — Z811 Family history of alcohol abuse and dependence: Secondary | ICD-10-CM | POA: Diagnosis not present

## 2022-07-08 DIAGNOSIS — L97529 Non-pressure chronic ulcer of other part of left foot with unspecified severity: Secondary | ICD-10-CM | POA: Diagnosis not present

## 2022-07-08 DIAGNOSIS — M21071 Valgus deformity, not elsewhere classified, right ankle: Secondary | ICD-10-CM | POA: Diagnosis not present

## 2022-07-08 DIAGNOSIS — I774 Celiac artery compression syndrome: Secondary | ICD-10-CM | POA: Diagnosis not present

## 2022-07-08 DIAGNOSIS — Z79899 Other long term (current) drug therapy: Secondary | ICD-10-CM

## 2022-07-08 DIAGNOSIS — I70245 Atherosclerosis of native arteries of left leg with ulceration of other part of foot: Secondary | ICD-10-CM | POA: Diagnosis not present

## 2022-07-08 DIAGNOSIS — M21072 Valgus deformity, not elsewhere classified, left ankle: Secondary | ICD-10-CM | POA: Diagnosis present

## 2022-07-08 DIAGNOSIS — Z8249 Family history of ischemic heart disease and other diseases of the circulatory system: Secondary | ICD-10-CM | POA: Diagnosis not present

## 2022-07-08 DIAGNOSIS — Z87891 Personal history of nicotine dependence: Secondary | ICD-10-CM

## 2022-07-08 DIAGNOSIS — I5A Non-ischemic myocardial injury (non-traumatic): Secondary | ICD-10-CM | POA: Diagnosis not present

## 2022-07-08 DIAGNOSIS — Z8679 Personal history of other diseases of the circulatory system: Secondary | ICD-10-CM | POA: Diagnosis not present

## 2022-07-08 DIAGNOSIS — J449 Chronic obstructive pulmonary disease, unspecified: Secondary | ICD-10-CM | POA: Diagnosis present

## 2022-07-08 DIAGNOSIS — I714 Abdominal aortic aneurysm, without rupture, unspecified: Secondary | ICD-10-CM | POA: Diagnosis not present

## 2022-07-08 DIAGNOSIS — Z888 Allergy status to other drugs, medicaments and biological substances status: Secondary | ICD-10-CM

## 2022-07-08 DIAGNOSIS — I10 Essential (primary) hypertension: Secondary | ICD-10-CM | POA: Diagnosis not present

## 2022-07-08 DIAGNOSIS — I739 Peripheral vascular disease, unspecified: Secondary | ICD-10-CM | POA: Diagnosis present

## 2022-07-08 DIAGNOSIS — Z823 Family history of stroke: Secondary | ICD-10-CM | POA: Diagnosis not present

## 2022-07-08 DIAGNOSIS — Z886 Allergy status to analgesic agent status: Secondary | ICD-10-CM

## 2022-07-08 DIAGNOSIS — F17200 Nicotine dependence, unspecified, uncomplicated: Secondary | ICD-10-CM | POA: Diagnosis present

## 2022-07-08 DIAGNOSIS — I160 Hypertensive urgency: Secondary | ICD-10-CM | POA: Diagnosis present

## 2022-07-08 DIAGNOSIS — M2011 Hallux valgus (acquired), right foot: Secondary | ICD-10-CM | POA: Diagnosis not present

## 2022-07-08 DIAGNOSIS — L818 Other specified disorders of pigmentation: Secondary | ICD-10-CM | POA: Diagnosis not present

## 2022-07-08 DIAGNOSIS — J811 Chronic pulmonary edema: Secondary | ICD-10-CM | POA: Diagnosis not present

## 2022-07-08 LAB — CBC
HCT: 35.3 % — ABNORMAL LOW (ref 36.0–46.0)
Hemoglobin: 11.4 g/dL — ABNORMAL LOW (ref 12.0–15.0)
MCH: 30.2 pg (ref 26.0–34.0)
MCHC: 32.3 g/dL (ref 30.0–36.0)
MCV: 93.6 fL (ref 80.0–100.0)
Platelets: 210 10*3/uL (ref 150–400)
RBC: 3.77 MIL/uL — ABNORMAL LOW (ref 3.87–5.11)
RDW: 14.9 % (ref 11.5–15.5)
WBC: 5.8 10*3/uL (ref 4.0–10.5)
nRBC: 0 % (ref 0.0–0.2)

## 2022-07-08 LAB — LIPID PANEL
Cholesterol: 111 mg/dL (ref 0–200)
HDL: 37 mg/dL — ABNORMAL LOW (ref >40)
LDL Cholesterol: 62 mg/dL (ref 0–99)
Total CHOL/HDL Ratio: 3 RATIO
Triglycerides: 61 mg/dL (ref <150)
VLDL: 12 mg/dL (ref 0–40)

## 2022-07-08 LAB — BASIC METABOLIC PANEL
Anion gap: 8 (ref 5–15)
BUN: 27 mg/dL — ABNORMAL HIGH (ref 8–23)
CO2: 24 mmol/L (ref 22–32)
Calcium: 9 mg/dL (ref 8.9–10.3)
Chloride: 106 mmol/L (ref 98–111)
Creatinine, Ser: 1.17 mg/dL — ABNORMAL HIGH (ref 0.44–1.00)
GFR, Estimated: 51 mL/min — ABNORMAL LOW (ref >60)
Glucose, Bld: 99 mg/dL (ref 70–99)
Potassium: 3.6 mmol/L (ref 3.5–5.1)
Sodium: 138 mmol/L (ref 135–145)

## 2022-07-08 LAB — BRAIN NATRIURETIC PEPTIDE: B Natriuretic Peptide: >4500 pg/mL — ABNORMAL HIGH (ref 0.0–100.0)

## 2022-07-08 LAB — APTT: aPTT: 41 seconds — ABNORMAL HIGH (ref 24–36)

## 2022-07-08 LAB — PROTIME-INR
INR: 1.3 — ABNORMAL HIGH (ref 0.8–1.2)
Prothrombin Time: 15.9 seconds — ABNORMAL HIGH (ref 11.4–15.2)

## 2022-07-08 LAB — TROPONIN I (HIGH SENSITIVITY)
Troponin I (High Sensitivity): 45 ng/L — ABNORMAL HIGH (ref <18)
Troponin I (High Sensitivity): 41 ng/L — ABNORMAL HIGH (ref <18)

## 2022-07-08 LAB — LACTIC ACID, PLASMA: Lactic Acid, Venous: 0.9 mmol/L (ref 0.5–1.9)

## 2022-07-08 LAB — SEDIMENTATION RATE: Sed Rate: 30 mm/hr (ref 0–30)

## 2022-07-08 MED ORDER — PIPERACILLIN-TAZOBACTAM 3.375 G IVPB 30 MIN
3.3750 g | Freq: Once | INTRAVENOUS | Status: AC
  Administered 2022-07-08: 3.375 g via INTRAVENOUS
  Filled 2022-07-08: qty 50

## 2022-07-08 MED ORDER — MOMETASONE FURO-FORMOTEROL FUM 100-5 MCG/ACT IN AERO
2.0000 | INHALATION_SPRAY | Freq: Two times a day (BID) | RESPIRATORY_TRACT | Status: DC
  Filled 2022-07-08: qty 8.8

## 2022-07-08 MED ORDER — DIPHENHYDRAMINE HCL 50 MG/ML IJ SOLN: 12.5000 mg | Freq: Three times a day (TID) | INTRAMUSCULAR | Status: DC | PRN

## 2022-07-08 MED ORDER — METHIMAZOLE 5 MG PO TABS
5.0000 mg | ORAL_TABLET | Freq: Every day | ORAL | Status: DC
  Administered 2022-07-08 – 2022-07-09 (×2): 5 mg via ORAL
  Filled 2022-07-08 (×3): qty 1

## 2022-07-08 MED ORDER — ONDANSETRON HCL 4 MG/2ML IJ SOLN
4.0000 mg | Freq: Once | INTRAMUSCULAR | Status: AC
  Administered 2022-07-08: 4 mg via INTRAVENOUS
  Filled 2022-07-08: qty 2

## 2022-07-08 MED ORDER — ZOLPIDEM TARTRATE 5 MG PO TABS: 5.0000 mg | ORAL_TABLET | Freq: Every evening | ORAL | Status: DC | PRN

## 2022-07-08 MED ORDER — HYDRALAZINE HCL 20 MG/ML IJ SOLN
5.0000 mg | INTRAMUSCULAR | Status: DC | PRN
  Administered 2022-07-08 (×2): 5 mg via INTRAVENOUS
  Filled 2022-07-08 (×4): qty 1

## 2022-07-08 MED ORDER — ACETAMINOPHEN 325 MG PO TABS: 650.0000 mg | ORAL_TABLET | Freq: Four times a day (QID) | ORAL | Status: DC | PRN

## 2022-07-08 MED ORDER — ALBUTEROL SULFATE (2.5 MG/3ML) 0.083% IN NEBU: 2.5000 mg | INHALATION_SOLUTION | RESPIRATORY_TRACT | Status: DC | PRN

## 2022-07-08 MED ORDER — VANCOMYCIN VARIABLE DOSE PER UNSTABLE RENAL FUNCTION (PHARMACIST DOSING): Status: DC

## 2022-07-08 MED ORDER — VANCOMYCIN HCL 1250 MG/250ML IV SOLN
1250.0000 mg | Freq: Once | INTRAVENOUS | Status: AC
  Administered 2022-07-08: 1250 mg via INTRAVENOUS
  Filled 2022-07-08: qty 250

## 2022-07-08 MED ORDER — MOMETASONE FURO-FORMOTEROL FUM 100-5 MCG/ACT IN AERO
2.0000 | INHALATION_SPRAY | Freq: Two times a day (BID) | RESPIRATORY_TRACT | Status: DC
  Administered 2022-07-09 – 2022-07-10 (×3): 2 via RESPIRATORY_TRACT
  Filled 2022-07-08 (×2): qty 8.8

## 2022-07-08 MED ORDER — PANTOPRAZOLE SODIUM 40 MG PO TBEC
40.0000 mg | DELAYED_RELEASE_TABLET | Freq: Every day | ORAL | Status: DC
  Administered 2022-07-08 – 2022-07-10 (×3): 40 mg via ORAL
  Filled 2022-07-08 (×3): qty 1

## 2022-07-08 MED ORDER — MORPHINE SULFATE (PF) 4 MG/ML IV SOLN
4.0000 mg | Freq: Once | INTRAVENOUS | Status: AC
  Administered 2022-07-08: 4 mg via INTRAVENOUS
  Filled 2022-07-08: qty 1

## 2022-07-08 MED ORDER — ESZOPICLONE 3 MG PO TABS
3.0000 mg | ORAL_TABLET | Freq: Every day | ORAL | Status: DC
  Administered 2022-07-08 – 2022-07-09 (×2): 3 mg via ORAL
  Filled 2022-07-08 (×2): qty 1

## 2022-07-08 MED ORDER — MONTELUKAST SODIUM 10 MG PO TABS: 10.0000 mg | ORAL_TABLET | Freq: Every evening | ORAL | Status: DC | PRN

## 2022-07-08 MED ORDER — NICOTINE 21 MG/24HR TD PT24
21.0000 mg | MEDICATED_PATCH | Freq: Every day | TRANSDERMAL | Status: DC
  Administered 2022-07-09 – 2022-07-10 (×2): 21 mg via TRANSDERMAL
  Filled 2022-07-08 (×3): qty 1

## 2022-07-08 MED ORDER — LOSARTAN POTASSIUM 50 MG PO TABS
100.0000 mg | ORAL_TABLET | Freq: Every day | ORAL | Status: DC
  Administered 2022-07-08 – 2022-07-10 (×3): 100 mg via ORAL
  Filled 2022-07-08 (×3): qty 2

## 2022-07-08 MED ORDER — MORPHINE SULFATE (PF) 2 MG/ML IV SOLN
2.0000 mg | INTRAVENOUS | Status: DC | PRN
  Administered 2022-07-08 – 2022-07-10 (×3): 2 mg via INTRAVENOUS
  Filled 2022-07-08 (×3): qty 1

## 2022-07-08 MED ORDER — ORAL CARE MOUTH RINSE: 15.0000 mL | OROMUCOSAL | Status: DC | PRN

## 2022-07-08 MED ORDER — OXYCODONE-ACETAMINOPHEN 5-325 MG PO TABS
1.0000 | ORAL_TABLET | ORAL | Status: DC | PRN
  Administered 2022-07-08 – 2022-07-10 (×6): 1 via ORAL
  Filled 2022-07-08 (×5): qty 1

## 2022-07-08 MED ORDER — SODIUM CHLORIDE 0.9 % IV SOLN
1.0000 g | INTRAVENOUS | Status: DC
  Administered 2022-07-08 – 2022-07-10 (×3): 1 g via INTRAVENOUS
  Filled 2022-07-08 (×3): qty 10

## 2022-07-08 MED ORDER — HYDRALAZINE HCL 20 MG/ML IJ SOLN
10.0000 mg | Freq: Once | INTRAMUSCULAR | Status: AC
  Administered 2022-07-08: 10 mg via INTRAVENOUS
  Filled 2022-07-08: qty 1

## 2022-07-08 MED ORDER — DM-GUAIFENESIN ER 30-600 MG PO TB12: 1.0000 | ORAL_TABLET | Freq: Two times a day (BID) | ORAL | Status: DC | PRN

## 2022-07-08 MED ORDER — METOPROLOL SUCCINATE ER 25 MG PO TB24
12.5000 mg | ORAL_TABLET | Freq: Every day | ORAL | Status: DC
  Administered 2022-07-08 – 2022-07-09 (×2): 12.5 mg via ORAL
  Filled 2022-07-08 (×2): qty 1

## 2022-07-08 MED ORDER — FUROSEMIDE 20 MG PO TABS
20.0000 mg | ORAL_TABLET | Freq: Every day | ORAL | Status: DC
  Administered 2022-07-08 – 2022-07-10 (×3): 20 mg via ORAL
  Filled 2022-07-08 (×4): qty 1

## 2022-07-08 MED ORDER — SODIUM CHLORIDE 0.9 % IV BOLUS
500.0000 mL | Freq: Once | INTRAVENOUS | Status: AC
  Administered 2022-07-08: 500 mL via INTRAVENOUS

## 2022-07-08 NOTE — H&P (Signed)
History and Physical    Jasmine Camacho ZOX:[id number] DOB: Jul 06, 1955 DOA: 07/08/2022  Referring MD/NP/PA:   PCP: Dorcas Carrow, DO   Patient coming from:  The patient is coming from home.     Chief Complaint: foot pain and toe discoloration  HPI: Jasmine Camacho is a 67 y.o. female with medical history significant of PVD, HTN. HLD, COPD, sCHF with EF 20%, CKD-2, tobacco abuse, history of ICH due to brain aneurysm (s/p coiled repair), intermittent aphasia, AAA, PVC, hyperthyroidism/Graves' disease), who presents with foot pain and toe discoloration.  Patient has hx of PVD and claudication. She has been following up with vascular surgeon. Pt is scheduled for procedure angio next week. Pt reports worsening pain and purplish discoloration of toes in both feet.  Patient has purulent discharge from fourth toe in both feet.  Her foot pain is constant, severe, sharp, nonradiating, not aggravated or alleviated by any known factors. Pt was started on Keflex yesterday without improvement. Patient does not have fever or chills.  She has nausea, no vomiting, diarrhea or abdominal pain.  Denies chest pain, shortness of breath or cough.  No symptoms of UTI.   Data reviewed independently and ED Course: pt was found to have WBC 5.8, trop 45 --> 41, lactic acid 0.9, slightly worsening renal function, BNP> 4500, temperature normal, blood pressure 198/127, heart rate 92, 86, RR 18, oxygen saturation 98% on room air.  Chest x-ray showed mild pulmonary edema.  Patient is admitted to telemetry bed as inpatient.  Dr. Wyn Quaker of [facility] and Dr. Lilian Kapur of podiatry is consulted.  X-ray of feet: 1. No acute finding in either foot. No soft tissue gas or radiopaque foreign body, and no osseous erosion or destruction. 2. Screw fixation of the proximal metaphysis of the first metatarsal on the right without evidence of complication. 3. Mild hallux valgus deformity bilaterally.   EKG: I have personally reviewed.  Sinus rhythm,  QTc 528, LAE, LAD, poor IV progression, T wave inversion in lateral leads, ST elevation only in lead III.  The repeated EKG showed no ST elevation.    Review of Systems:   General: no fevers, chills, no body weight gain, fatigue HEENT: no blurry vision, hearing changes or sore throat Respiratory: no dyspnea, coughing, wheezing CV: no chest pain, no palpitations GI: no nausea, vomiting, abdominal pain, diarrhea, constipation GU: no dysuria, burning on urination, increased urinary frequency, hematuria  Ext: no leg edema.  Neuro: no unilateral weakness, numbness, or tingling, no vision change or hearing loss Skin: has toe discoloration. MSK: No muscle spasm, no deformity, no limitation of range of movement in spin. Has foot pain Heme: No easy bruising.  Travel history: No recent long distant travel.   Allergy:  Allergies  Allergen Reactions   Ambien [Zolpidem]    Asa [Aspirin]    Benazepril Hcl    Carvedilol Other (See Comments)    GI upset   Codeine Sulfate Nausea Only   Metoprolol Tartrate    Other Other (See Comments)    Valtura   Aldactone [Spironolactone] Other (See Comments)    Abdominal pain    Hctz [Hydrochlorothiazide] Rash    Past Medical History:  Diagnosis Date   Acute respiratory failure (HCC) 11/17/2018   Aphasia    Hypertension    Insomnia    Menopausal symptom    Menorrhagia    Pneumonia    Seasonal allergies    Tachycardia    Thyroid disease    Ventricular premature contractions  Past Surgical History:  Procedure Laterality Date   aneurysm surgery  09/2003   DILATION AND CURETTAGE OF UTERUS  2007    Social History:  reports that she has been smoking cigarettes. She has been smoking an average of .25 packs per day. She has never used smokeless tobacco. She reports that she does not drink alcohol and does not use drugs.  Family History:  Family History  Problem Relation Age of Onset   Stroke Mother    Diabetes Mother    Heart disease Mother     Hypertension Mother    Alcohol abuse Father    Cancer Father        liver   Diabetes Brother    Hypertension Brother    Heart disease Brother    Cancer Brother        colon     Prior to Admission medications   Medication Sig Start Date End Date Taking? Authorizing Provider  acetaminophen (TYLENOL) 650 MG CR tablet Take 650 mg by mouth every 8 (eight) hours as needed (pain or fever).    [provider]  albuterol (VENTOLIN HFA) 108 (90 Base) MCG/ACT inhaler INL 2 PFS PO Q 4 TO 6 H PRN 04/24/22   Johnson, Megan P, DO  cephALEXin (KEFLEX) 500 MG capsule Take 1 capsule (500 mg total) by mouth 4 (four) times daily. 07/07/22   Larae Grooms, NP  Eszopiclone 3 MG TABS Take 1 tablet (3 mg total) by mouth at bedtime. TAKE 1 TABLET BY MOUTH IMMEDIATELY BEFORE BEDTIME. MUST LAST 30 DAYS 04/24/22 07/23/22  Olevia Perches P, DO  fluticasone-salmeterol (ADVAIR) 100-50 MCG/ACT AEPB Inhale 1 puff into the lungs 2 (two) times daily. 04/24/22   Johnson, Megan P, DO  furosemide (LASIX) 20 MG tablet TAKE 2 TABLETS(40 MG) BY MOUTH DAILY 05/09/21   Johnson, Megan P, DO  HYDROcodone-acetaminophen (NORCO/VICODIN) 5-325 MG tablet Take 1 tablet by mouth every 8 (eight) hours. 07/07/22 07/07/23  Larae Grooms, NP  losartan (COZAAR) 100 MG tablet Take 100 mg by mouth daily. 07/31/21   [provider]  methimazole (TAPAZOLE) 5 MG tablet Take 5 mg by mouth daily.     [provider]  metoprolol succinate (TOPROL-XL) 25 MG 24 hr tablet Take 0.5 tablets (12.5 mg total) by mouth at bedtime. 04/24/22   Johnson, Megan P, DO  montelukast (SINGULAIR) 10 MG tablet Take 1 tablet (10 mg total) by mouth at bedtime as needed (for allergies and stuffiness). TAKE 1 TABLET(10 MG) BY MOUTH AT BEDTIME 04/24/22   Johnson, Megan P, DO  omeprazole (PRILOSEC) 20 MG capsule TAKE 1 CAPSULE(20 MG) BY MOUTH DAILY 04/20/22   Johnson, Megan P, DO  ondansetron (ZOFRAN) 4 MG tablet Take 4 mg by mouth every 8 (eight) hours  as needed. 11/13/21   [provider]  pravastatin (PRAVACHOL) 20 MG tablet Take 1 tablet (20 mg total) by mouth every Monday, Wednesday, and Friday. 04/24/22   Dorcas Carrow, DO    Physical Exam: Vitals:   07/08/22 1524 07/08/22 1641 07/08/22 1700 07/08/22 1746  BP: (!) 174/114 (!) 177/129 (!) 172/106 (!) 157/113  Pulse: 90 90 94 (!) 108  Resp: 20 19 18 16   Temp: 97.6 F (36.4 C)   98.1 F (36.7 C)  TempSrc: Oral     SpO2: 96% 96% 95% 97%  Weight:      Height:       General: Not in acute distress HEENT:       Eyes: PERRL,  EOMI, no scleral icterus.       ENT: No discharge from the ears and nose, no pharynx injection, no tonsillar enlargement.        Neck: No JVD, no bruit, no mass felt. Heme: No neck lymph node enlargement. Cardiac: S1/S2, RRR, No murmurs, No gallops or rubs. Respiratory: No rales, wheezing, rhonchi or rubs. GI: Soft, nondistended, nontender, no rebound pain, no organomegaly, BS present. GU: No hematuria Ext: No pitting leg edema bilaterally. Musculoskeletal: No joint deformities, No joint redness or warmth, no limitation of ROM in spin. Skin: Has toe discoloration, has purulent discharge from.  In both feet, has severe tenderness even on light touch to both feet              Neuro: Alert, oriented X3, cranial nerves II-XII grossly intact, moves all extremities normally. Psych: Patient is not psychotic, no suicidal or hemocidal ideation.  Labs on Admission: I have personally reviewed following labs and imaging studies  CBC: Recent Labs  Lab 07/08/22 0822  WBC 5.8  HGB 11.4*  HCT 35.3*  MCV 93.6  PLT 210   Basic Metabolic Panel: Recent Labs  Lab 07/08/22 0822  NA 138  K 3.6  CL 106  CO2 24  GLUCOSE 99  BUN 27*  CREATININE 1.17*  CALCIUM 9.0   GFR: Estimated Creatinine Clearance: 39 mL/min (A) (by C-G formula based on SCr of 1.17 mg/dL (H)). Liver Function Tests: No results for input(s): "AST", "ALT", "ALKPHOS",  "BILITOT", "PROT", "ALBUMIN" in the last 168 hours. No results for input(s): "LIPASE", "AMYLASE" in the last 168 hours. No results for input(s): "AMMONIA" in the last 168 hours. Coagulation Profile: No results for input(s): "INR", "PROTIME" in the last 168 hours. Cardiac Enzymes: No results for input(s): "CKTOTAL", "CKMB", "CKMBINDEX", "TROPONINI" in the last 168 hours. BNP (last 3 results) No results for input(s): "PROBNP" in the last 8760 hours. HbA1C: No results for input(s): "HGBA1C" in the last 72 hours. CBG: No results for input(s): "GLUCAP" in the last 168 hours. Lipid Profile: No results for input(s): "CHOL", "HDL", "LDLCALC", "TRIG", "CHOLHDL", "LDLDIRECT" in the last 72 hours. Thyroid Function Tests: No results for input(s): "TSH", "T4TOTAL", "FREET4", "T3FREE", "THYROIDAB" in the last 72 hours. Anemia Panel: No results for input(s): "VITAMINB12", "FOLATE", "FERRITIN", "TIBC", "IRON", "RETICCTPCT" in the last 72 hours. Urine analysis:    Component Value Date/Time   COLORURINE YELLOW (A) 11/17/2018 1511   APPEARANCEUR Clear 11/28/2018 1031   LABSPEC >1.046 (H) 11/17/2018 1511   PHURINE 5.0 11/17/2018 1511   GLUCOSEU Negative 11/28/2018 1031   HGBUR NEGATIVE 11/17/2018 1511   BILIRUBINUR Negative 11/28/2018 1031   KETONESUR NEGATIVE 11/17/2018 1511   PROTEINUR Negative 11/28/2018 1031   PROTEINUR 30 (A) 11/17/2018 1511   NITRITE Negative 11/28/2018 1031   NITRITE NEGATIVE 11/17/2018 1511   LEUKOCYTESUR Negative 11/28/2018 1031   LEUKOCYTESUR NEGATIVE 11/17/2018 1511   Sepsis Labs: @LABRCNTIP (procalcitonin:4,lacticidven:4) )No results found for this or any previous visit (from the past 240 hour(s)).   Radiological Exams on Admission: DG Foot Complete Left  Result Date: 07/08/2022 CLINICAL DATA:  Hypertension and black toes. Bilateral foot and toe pain. EXAM: LEFT FOOT - COMPLETE 3+ VIEW; RIGHT FOOT COMPLETE - 3+ VIEW COMPARISON:  None Available. FINDINGS: Right:  There is no acute fracture or dislocation. There is mild hallux valgus deformity. Alignment is otherwise normal. Screw fixation of the proximal metaphysis of the first metatarsal is noted without evidence of complication. There is no perihardware lucency. There is no  bony erosion. The joint spaces are overall preserved. There is mild Achilles enthesopathy. The soft tissues are unremarkable. There is no soft tissue gas or radiopaque foreign body. Left: There is no acute fracture or dislocation. There is mild hallux valgus deformity. Alignment is otherwise normal. There is no osseous erosion or destruction. The joint spaces are overall preserved. There is no soft tissue gas or radiopaque foreign body. IMPRESSION: 1. No acute finding in either foot. No soft tissue gas or radiopaque foreign body, and no osseous erosion or destruction. 2. Screw fixation of the proximal metaphysis of the first metatarsal on the right without evidence of complication. 3. Mild hallux valgus deformity bilaterally. Electronically Signed   By: Lesia Hausen M.D.   On: 07/08/2022 09:53   DG Foot Complete Right  Result Date: 07/08/2022 CLINICAL DATA:  Hypertension and black toes. Bilateral foot and toe pain. EXAM: LEFT FOOT - COMPLETE 3+ VIEW; RIGHT FOOT COMPLETE - 3+ VIEW COMPARISON:  None Available. FINDINGS: Right: There is no acute fracture or dislocation. There is mild hallux valgus deformity. Alignment is otherwise normal. Screw fixation of the proximal metaphysis of the first metatarsal is noted without evidence of complication. There is no perihardware lucency. There is no bony erosion. The joint spaces are overall preserved. There is mild Achilles enthesopathy. The soft tissues are unremarkable. There is no soft tissue gas or radiopaque foreign body. Left: There is no acute fracture or dislocation. There is mild hallux valgus deformity. Alignment is otherwise normal. There is no osseous erosion or destruction. The joint spaces are  overall preserved. There is no soft tissue gas or radiopaque foreign body. IMPRESSION: 1. No acute finding in either foot. No soft tissue gas or radiopaque foreign body, and no osseous erosion or destruction. 2. Screw fixation of the proximal metaphysis of the first metatarsal on the right without evidence of complication. 3. Mild hallux valgus deformity bilaterally. Electronically Signed   By: Lesia Hausen M.D.   On: 07/08/2022 09:53   DG Chest 2 View  Result Date: 07/08/2022 CLINICAL DATA:  Discoloration of the toes EXAM: CHEST - 2 VIEW COMPARISON:  Chest radiograph dated 11/17/2018 FINDINGS: Normal lung volumes. Bilateral interstitial opacities. No pleural effusion or pneumothorax. Enlarged cardiomediastinal silhouette. No acute osseous abnormality. IMPRESSION: 1. Mild bilateral pulmonary edema. 2. Enlarged cardiomediastinal silhouette. Electronically Signed   By: Agustin Cree M.D.   On: 07/08/2022 08:57      Assessment/Plan Principal Problem:   PVD (peripheral vascular disease) (HCC) Active Problems:   Toe infection   Hypertension   Chronic systolic CHF (congestive heart failure) (HCC)   Myocardial injury   Chronic obstructive pulmonary disease (HCC)   HLD (hyperlipidemia)   Hyperthyroidism   CKD (chronic kidney disease) stage 2, GFR 60-89 ml/min   Tobacco dependence   Assessment and Plan:  PVD (peripheral vascular disease) (HCC): consulted Dr. Wyn Quaker of [facility], planning to do bilateral lower extremity angiograms with possible intervention. -will admit to tele bed as inpt -pain control: prn morphine, Percocet and Tylenol -Check PT/PTT -Follow-up vascular surgery's recommendation  Toe infection: Consulted Dr. Lilian Kapur of podiatry.  Patient is not septic.  Lactic acid is normal -Vancomycin and Rocephin (patient received 1 dose of Zosyn in ED) -Blood culture, ESR and CRP   Hypertension -IV hydralazine as needed -Patient is on Lasix, Cozaar, metoprolol  Chronic systolic CHF (congestive  heart failure) (HCC): 2D echo on 07/04/2021 showed EF 20%.  Patient has elevated BNP > 4500, but no leg edema or JVD.  No worsening shortness breath.  Chest x-ray showed mild pulmonary edema, does not have acute CHF exacerbation -Continue Lasix 20 mg daily  Myocardial injury: Troponin level 45 --> 41, no chest pain -Check A1c, FLP -Aspirin -Patient's not taking pravastatin currently  Chronic obstructive pulmonary disease (HCC): Stable -Bronchodilators as needed  HLD (hyperlipidemia): Patient's not taking pravastatin currently -Follow-up with PCP  Hyperthyroidism: Recent TSH 4.32 on 08/08/2021 -Continue methimazole  CKD (chronic kidney disease) stage 2, GFR 60-89 ml/min: Renal function slightly worsened than baseline.  Baseline creatinine 0.86 on 08/08/2021.  Her creatinine is 1.17, BUN 27, GFR 51 -Follow-up with BMP  Tobacco dependence -Nicotine patch       DVT ppx: SCD  Code Status:  Partial code (I discussed with the patient in the presence of her husband and daughter, and explained the meaning of CODE STATUS, patient wants to be partial code, OK for CPR, but no intubation).   Family Communication: Yes, patient's husband and daughter    at bed side.      Disposition Plan:  Anticipate discharge back to previous environment  Consults called:  Dr. Wyn Quaker of [facility] and Dr. Lilian Kapur of podiatry is consulted.  Admission status and Level of care: Telemetry Cardiac:    as inpt      Dispo: The patient is from: Home              Anticipated d/c is to: Home              Anticipated d/c date is: 2 days              Patient currently is not medically stable to d/c.    Severity of Illness:  The appropriate patient status for this patient is INPATIENT. Inpatient status is judged to be reasonable and necessary in order to provide the required intensity of service to ensure the patient's safety. The patient's presenting symptoms, physical exam findings, and initial radiographic and  laboratory data in the context of their chronic comorbidities is felt to place them at high risk for further clinical deterioration. Furthermore, it is not anticipated that the patient will be medically stable for discharge from the hospital within 2 midnights of admission.  : * I certify that at the point of admission it is my clinical judgment that the patient will require inpatient hospital care spanning beyond 2 midnights from the point of admission due to high intensity of service, high risk for further deterioration and high frequency of surveillance required.*       Date of Service 07/08/2022    Lorretta Harp [facility]   If 7PM-7AM, please contact night-coverage [email or web address] 07/08/2022, 6:11 PM

## 2022-07-08 NOTE — ED Triage Notes (Signed)
Pt sent for hypertension and "black toes" per family. Pt with 1+ bilateral pedal pulses noted and discolored toes noted. Pt denies headache or chest pain.

## 2022-07-08 NOTE — ED Provider Notes (Signed)
[facility] Provider Note    Event Date/Time   First MD Initiated Contact with Patient 07/08/22 0840     (approximate)   History   Hypertension   HPI  Jasmine Camacho is a 67 y.o. female   Past medical history of hypertension, AAA s/p repair, COPD, iron deficiency anemia, CHF, here with pain in all of her toes and discoloration progressively worsening.  Establish care with vascular surgery with a procedure angio planned for next week.  Has had some purulent drainage from her toes as well and started on Keflex yesterday.  She has had some subjective fevers/chills.  She reports no other acute medical complaints.  Independent Historian contributed to assessment above: Husband and daughter at bedside corroborate information given above.  External Medical Documents Reviewed: Vascular surgery note from [facility] earlier this month with known atherosclerotic disease to the lower extremities affecting her toes and causing toe pain with angiography planned in the near future      Physical Exam   Triage Vital Signs: ED Triage Vitals [07/08/22 0812]  Enc Vitals Group     BP (!) 198/127     Pulse Rate 86     Resp 18     Temp      Temp Source Oral     SpO2 98 %     Weight 115 lb (52.2 kg)     Height      Head Circumference      Peak Flow      Pain Score      Pain Loc      Pain Edu?      Excl. in GC?     Most recent vital signs: Vitals:   07/08/22 0812 07/08/22 0859  BP: (!) 198/127   Pulse: 86   Resp: 18   Temp:  98.4 F (36.9 C)  SpO2: 98%     General: Awake, no distress.  CV:  Good peripheral perfusion.  Resp:  Normal effort.  Abd:  No distention.  Other:  Awake alert pleasant nontoxic appearing hypertensive otherwise normal vital signs, she has blue/black mild discoloration of all of her toes perhaps sparing only the right great toe.  There is some purulent drainage coming from the fourth digit on bilateral feet.  Exquisitely tender to  palpation.  The remainder of the foot appears warm and well-perfused and sensate.  Faint palpable pedal pulses bilaterally.   ED Results / Procedures / Treatments   Labs (all labs ordered are listed, but only abnormal results are displayed) Labs Reviewed  BASIC METABOLIC PANEL - Abnormal; Notable for the following components:      Result Value   BUN 27 (*)    Creatinine, Ser 1.17 (*)    GFR, Estimated 51 (*)    All other components within normal limits  CBC - Abnormal; Notable for the following components:   RBC 3.77 (*)    Hemoglobin 11.4 (*)    HCT 35.3 (*)    All other components within normal limits  TROPONIN I (HIGH SENSITIVITY) - Abnormal; Notable for the following components:   Troponin I (High Sensitivity) 45 (*)    All other components within normal limits  CULTURE, BLOOD (ROUTINE X 2)  CULTURE, BLOOD (ROUTINE X 2)  LACTIC ACID, PLASMA  TROPONIN I (HIGH SENSITIVITY)     I ordered and reviewed the above labs they are notable for WBC nl  EKG  ED ECG REPORT I, Pilar Jarvis, the attending  physician, personally viewed and interpreted this ECG.   Date: 07/08/2022  EKG Time: 0835  Rate: 79  Rhythm: sinuis  Axis: nl  Intervals: prolonged qtc   ST&T Change: TWI lateral v5-6, no stemi    RADIOLOGY I independently reviewed and interpreted chest x-ray and see no obvious opacities or pneumothorax   PROCEDURES:  Critical Care performed: No  Procedures   MEDICATIONS ORDERED IN ED: Medications  vancomycin (VANCOREADY) IVPB 1250 mg/250 mL (has no administration in time range)  hydrALAZINE (APRESOLINE) injection 5 mg (has no administration in time range)  diphenhydrAMINE (BENADRYL) injection 12.5 mg (has no administration in time range)  acetaminophen (TYLENOL) tablet 650 mg (has no administration in time range)  oxyCODONE-acetaminophen (PERCOCET/ROXICET) 5-325 MG per tablet 1 tablet (has no administration in time range)  morphine (PF) 4 MG/ML injection 4 mg (4 mg  Intravenous Given 07/08/22 0858)  sodium chloride 0.9 % bolus 500 mL (0 mLs Intravenous Stopped 07/08/22 0940)  ondansetron (ZOFRAN) injection 4 mg (4 mg Intravenous Given 07/08/22 0858)  piperacillin-tazobactam (ZOSYN) IVPB 3.375 g (0 g Intravenous Stopped 07/08/22 1013)    External physician / consultants:  I spoke with Vascular surgery consultant regarding care plan for this patient.   IMPRESSION / MDM / ASSESSMENT AND PLAN / ED COURSE  I reviewed the triage vital signs and the nursing notes.                                Patient's presentation is most consistent with acute presentation with potential threat to life or bodily function.  Differential diagnosis includes, but is not limited to, digit ischemia in the setting of atherosclerosis, claudication, osteomyelitis, sepsis   The patient is on the cardiac monitor to evaluate for evidence of arrhythmia and/or significant heart rate changes.  MDM: This patient with toe pain discoloration in the setting of known vascular disease, with angio scheduled for next week but pain has gotten worse and she has some drainage to the toe is now concerning for infection.  Will start on IV pain control with IV morphine, get x-rays, vascular consultation.  --  I spoke with Dr. Wyn Quaker of vascular who reviewed the case and recommends admission to the hospital at this time.  I started her on IV antibiotics.  She remained stable.       FINAL CLINICAL IMPRESSION(S) / ED DIAGNOSES   Final diagnoses:  Toe infection     Rx / DC Orders   ED Discharge Orders     None        Note:  This document was prepared using Dragon voice recognition software and may include unintentional dictation errors.    Pilar Jarvis, MD 07/08/22 1020

## 2022-07-08 NOTE — Consult Note (Signed)
Hospital Consult    Reason for Consult:  Bilateral lower extremity toe pain Requesting Physician:  Dr. Lorretta Harp MD MRN #:  [id number]  History of Present Illness: This is a 67 y.o. female with medical history significant of PVD, HTN. HLD, COPD, sCHF with EF 20%, CKD-2, tobacco abuse, history of ICH due to brain aneurysm (s/p coiled repair), intermittent aphasia, AAA, PVC, hyperthyroidism/Graves' disease), who presents with foot pain and toe discoloration. Patient has hx of PVD and claudication. She has been following up with vascular surgeon Dr Vilinda Flake MD. Pt is scheduled for outpatient angiogram of left lower extremity next week. Pt reports worsening pain and purplish discoloration of toes in both feet. Husband brings the patient to the emergency room. Patient has purulent discharge from fourth toe in both feet.  Her foot pain is constant, severe, sharp, nonradiating, not aggravated or alleviated by any known factors. Pt was started on Keflex yesterday without improvement.   Past Medical History:  Diagnosis Date   Acute respiratory failure (HCC) 11/17/2018   Aphasia    Hypertension    Insomnia    Menopausal symptom    Menorrhagia    Pneumonia    Seasonal allergies    Tachycardia    Thyroid disease    Ventricular premature contractions     Past Surgical History:  Procedure Laterality Date   aneurysm surgery  09/2003   DILATION AND CURETTAGE OF UTERUS  2007    Allergies  Allergen Reactions   Ambien [Zolpidem]    Asa [Aspirin]    Benazepril Hcl    Carvedilol Other (See Comments)    GI upset   Codeine Sulfate Nausea Only   Metoprolol Tartrate    Other Other (See Comments)    Valtura   Aldactone [Spironolactone] Other (See Comments)    Abdominal pain    Hctz [Hydrochlorothiazide] Rash    Prior to Admission medications   Medication Sig Start Date End Date Taking? Authorizing Provider  acetaminophen (TYLENOL) 650 MG CR tablet Take 650 mg by mouth every 8 (eight) hours  as needed (pain or fever).   Yes [provider]  albuterol (VENTOLIN HFA) 108 (90 Base) MCG/ACT inhaler INL 2 PFS PO Q 4 TO 6 H PRN 04/24/22  Yes Johnson, Megan P, DO  cephALEXin (KEFLEX) 500 MG capsule Take 1 capsule (500 mg total) by mouth 4 (four) times daily. 07/07/22  Yes Larae Grooms, NP  Eszopiclone 3 MG TABS Take 1 tablet (3 mg total) by mouth at bedtime. TAKE 1 TABLET BY MOUTH IMMEDIATELY BEFORE BEDTIME. MUST LAST 30 DAYS 04/24/22 07/23/22 Yes Johnson, Megan P, DO  fluticasone-salmeterol (ADVAIR) 100-50 MCG/ACT AEPB Inhale 1 puff into the lungs 2 (two) times daily. 04/24/22  Yes Johnson, Megan P, DO  furosemide (LASIX) 20 MG tablet TAKE 2 TABLETS(40 MG) BY MOUTH DAILY 05/09/21  Yes Johnson, Megan P, DO  HYDROcodone-acetaminophen (NORCO/VICODIN) 5-325 MG tablet Take 1 tablet by mouth every 8 (eight) hours. 07/07/22 07/07/23 Yes Larae Grooms, NP  losartan (COZAAR) 100 MG tablet Take 100 mg by mouth daily. 07/31/21  Yes [provider]  methimazole (TAPAZOLE) 5 MG tablet Take 5 mg by mouth daily.    Yes [provider]  metoprolol succinate (TOPROL-XL) 25 MG 24 hr tablet Take 0.5 tablets (12.5 mg total) by mouth at bedtime. 04/24/22  Yes Johnson, Megan P, DO  montelukast (SINGULAIR) 10 MG tablet Take 1 tablet (10 mg total) by mouth at bedtime as needed (for allergies and stuffiness). TAKE 1  TABLET(10 MG) BY MOUTH AT BEDTIME 04/24/22  Yes Johnson, Megan P, DO  omeprazole (PRILOSEC) 20 MG capsule TAKE 1 CAPSULE(20 MG) BY MOUTH DAILY 04/20/22  Yes Laural Benes, Megan P, DO  pravastatin (PRAVACHOL) 20 MG tablet Take 1 tablet (20 mg total) by mouth every Monday, Wednesday, and Friday. 04/24/22  Yes Johnson, Megan P, DO  ondansetron (ZOFRAN) 4 MG tablet Take 4 mg by mouth every 8 (eight) hours as needed. Patient not taking: Reported on 07/08/2022 11/13/21   [provider]    Social History   Socioeconomic History   Marital status: Married    Spouse name: Not on file    Number of children: Not on file   Years of education: Not on file   Highest education level: Not on file  Occupational History   Not on file  Tobacco Use   Smoking status: Every Day    Packs/day: .25    Types: Cigarettes   Smokeless tobacco: Never  Vaping Use   Vaping Use: Never used  Substance and Sexual Activity   Alcohol use: No    Alcohol/week: 0.0 standard drinks of alcohol   Drug use: No   Sexual activity: Never  Other Topics Concern   Not on file  Social History Narrative   Not on file   Social Determinants of Health   Financial Resource Strain: Not on file  Food Insecurity: No Food Insecurity (07/08/2022)   Hunger Vital Sign    Worried About Running Out of Food in the Last Year: Never true    Ran Out of Food in the Last Year: Never true  Transportation Needs: No Transportation Needs (07/08/2022)   PRAPARE - Administrator, Civil Service (Medical): No    Lack of Transportation (Non-Medical): No  Physical Activity: Not on file  Stress: Not on file  Social Connections: Not on file  Intimate Partner Violence: Not At Risk (07/08/2022)   Humiliation, Afraid, Rape, and Kick questionnaire    Fear of Current or Ex-Partner: No    Emotionally Abused: No    Physically Abused: No    Sexually Abused: No     Family History  Problem Relation Age of Onset   Stroke Mother    Diabetes Mother    Heart disease Mother    Hypertension Mother    Alcohol abuse Father    Cancer Father        liver   Diabetes Brother    Hypertension Brother    Heart disease Brother    Cancer Brother        colon    ROS: Otherwise negative unless mentioned in HPI  Physical Examination  Vitals:   07/08/22 1200 07/08/22 1230  BP: (!) 185/117 (!) 191/134  Pulse:    Resp: (!) 25 (!) 24  Temp:    SpO2:     Body mass index is 19.74 kg/m.  General:  WDWN in NAD Gait: Not observed HENT: WNL, normocephalic Pulmonary: normal non-labored breathing, without Rales, rhonchi,   wheezing Cardiac: regular, without  Murmurs, rubs or gallops; without carotid bruits Abdomen: Positive bowel sounds, soft, NT/ND, no masses Skin: without rashes Vascular Exam/Pulses: Non palpable pulses in bilateral lower extremities, Post tibial pulses Pos with doppler. Left post tibial very weak and difficult to find and hear.  Extremities: with ischemic changes, without Gangrene , with cellulitis; with open wounds;  Musculoskeletal: no muscle wasting or atrophy  Neurologic: A&O X 3;  No focal weakness or paresthesias are detected;  speech is fluent/normal Psychiatric:  The pt has Normal affect. Lymph:  Unremarkable  CBC    Component Value Date/Time   WBC 5.8 07/08/2022 0822   RBC 3.77 (L) 07/08/2022 0822   HGB 11.4 (L) 07/08/2022 0822   HGB 11.9 08/08/2021 1052   HCT 35.3 (L) 07/08/2022 0822   HCT 35.1 08/08/2021 1052   PLT 210 07/08/2022 0822   PLT 199 08/08/2021 1052   MCV 93.6 07/08/2022 0822   MCV 90 08/08/2021 1052   MCH 30.2 07/08/2022 0822   MCHC 32.3 07/08/2022 0822   RDW 14.9 07/08/2022 0822   RDW 14.1 08/08/2021 1052   LYMPHSABS 1.1 08/08/2021 1052   MONOABS 0.5 11/17/2018 1311   EOSABS 0.5 (H) 08/08/2021 1052   BASOSABS 0.1 08/08/2021 1052    BMET    Component Value Date/Time   NA 138 07/08/2022 0822   NA 140 08/08/2021 1052   K 3.6 07/08/2022 0822   CL 106 07/08/2022 0822   CO2 24 07/08/2022 0822   GLUCOSE 99 07/08/2022 0822   BUN 27 (H) 07/08/2022 0822   BUN 19 08/08/2021 1052   CREATININE 1.17 (H) 07/08/2022 0822   CALCIUM 9.0 07/08/2022 0822   GFRNONAA 51 (L) 07/08/2022 0822   GFRAA 81 08/21/2019 1012    COAGS: No results found for: "INR", "PROTIME"   Non-Invasive Vascular Imaging:   X-ray of feet: 1. No acute finding in either foot. No soft tissue gas or radiopaque foreign body, and no osseous erosion or destruction. 2. Screw fixation of the proximal metaphysis of the first metatarsal on the right without evidence of complication. 3. Mild  hallux valgus deformity bilaterally.  LOWER EXTREMITY DOPPLER STUDY   Patient Name:  ENAJAH FARIN  Date of Exam:   06/24/2022  Medical Rec #: [id number]      Accession #:    [id number]  Date of Birth: March 15, 1955      Patient Gender: F  Patient Age:   30 years  Exam Location:  [place]  Procedure:      VAS [place] ABI WITH/WO TBI  Referring Phys: Vivia Birmingham BROWN    ---------------------------------------------------------------------------  -----    Indications: Claudication, rest pain, and peripheral artery disease. Known               aneurysms   High Risk Factors: Hypertension, current smoker.     Performing Technologist: Hardie Lora RVT     Examination Guidelines: A complete evaluation includes at minimum, Doppler  waveform signals and systolic blood pressure reading at the level of  bilateral  brachial, anterior tibial, and posterior tibial arteries, when vessel  segments  are accessible. Bilateral testing is considered an integral part of a  complete  examination. Photoelectric Plethysmograph (PPG) waveforms and toe systolic  pressure readings are included as required and additional duplex testing  as  needed. Limited examinations for reoccurring indications may be performed  as  noted.     ABI Findings:  +---------+------------------+-----+----------+--------+  Right   Rt Pressure (mmHg)IndexWaveform  Comment   +---------+------------------+-----+----------+--------+  Brachial 176                                        +---------+------------------+-----+----------+--------+  PTA     112               0.64 monophasic          +---------+------------------+-----+----------+--------+  DP      78                0.44 monophasic          +---------+------------------+-----+----------+--------+  Great Toe24                0.14                     +---------+------------------+-----+----------+--------+    +---------+------------------+-----+----------+-------+  Left    Lt Pressure (mmHg)IndexWaveform  Comment  +---------+------------------+-----+----------+-------+  Brachial 157                                       +---------+------------------+-----+----------+-------+  PTA     139               0.79 monophasic         +---------+------------------+-----+----------+-------+  DP      114               0.65 monophasic         +---------+------------------+-----+----------+-------+  Great Toe0                 0.00                    +---------+------------------+-----+----------+-------+   +-------+-----------+-----------+------------+------------+  ABI/TBIToday's ABIToday's TBIPrevious ABIPrevious TBI  +-------+-----------+-----------+------------+------------+  Right 0.64       0.14                                 +-------+-----------+-----------+------------+------------+  Left  0.79       0.00                                 +-------+-----------+-----------+------------+------------+         Summary:  Right: Resting right ankle-brachial index indicates moderate right lower  extremity arterial disease. The right toe-brachial index is abnormal.   Left: Resting left ankle-brachial index indicates moderate left lower  extremity arterial disease. The left toe-brachial index is abnormal.    Statin:  Yes.   Beta Blocker:  Yes.   Aspirin:  No. ACEI:  No. ARB:  Yes.   CCB use:  No Other antiplatelets/anticoagulants:  No.    ASSESSMENT/PLAN: This is a 67 y.o. female who presents to [facility] emergency department for worsening pain of bilateral lower extremities with purple toes.  Patient noted to have 2 weeks ago ABI and TBI ultrasounds.  Patient's right TBI noted to be 0.14 and left TBI noted to be 0.  Doppler pulses were found in the posterior tibial artery right greater than left.  Left posterior tibial artery Doppler pulse was  difficult to find and very weak.  All toes on bilateral lower extremities are purple today.  Patient endorses extreme pain when you touch her feet.  Bilateral lower extremities to the ankles are cool to touch.  PLAN: Vascular surgery plans on taking the patient to the vascular lab on 2 separate occasions for bilateral lower extremity angiograms with possible intervention.  Patient will be taken to the lab this Friday, 07/10/2022 for a left lower extremity angiogram with possible intervention and will be taken the following Tuesday, 07/14/2022 for a right lower extremity angiogram with possible intervention.  I discussed in detail today with  the patient her husband and her daughter at the bedside the procedure, benefits, complications, and risks.  All family members verbalized their understanding.  I answered all of the patient's and family's questions this morning.  The patient and the family would like to proceed with the procedure as soon as possible.  Patient will be made n.p.o. after midnight on Friday, 07/10/2022.   -This plan was discussed in detail with Dr. Festus Barren MD and Dr. Levora Dredge MD for both procedures and they both agree with the plan.   Marcie [facility] 07/08/2022 12:41 PM

## 2022-07-08 NOTE — ED Notes (Signed)
Pt given lunch tray.

## 2022-07-08 NOTE — H&P (View-Only) (Signed)
Hospital Consult    Reason for Consult:  Bilateral lower extremity toe pain Requesting Physician:  Dr. Xilin Niu MD MRN #:  [id number]  History of Present Illness: This is a 67 y.o. female with medical history significant of PVD, HTN. HLD, COPD, sCHF with EF 20%, CKD-2, tobacco abuse, history of ICH due to brain aneurysm (s/p coiled repair), intermittent aphasia, AAA, PVC, hyperthyroidism/Graves' disease), who presents with foot pain and toe discoloration. Patient has hx of PVD and claudication. She has been following up with vascular surgeon Dr Greg Schnier MD. Pt is scheduled for outpatient angiogram of left lower extremity next week. Pt reports worsening pain and purplish discoloration of toes in both feet. Husband brings the patient to the emergency room. Patient has purulent discharge from fourth toe in both feet.  Her foot pain is constant, severe, sharp, nonradiating, not aggravated or alleviated by any known factors. Pt was started on Keflex yesterday without improvement.   Past Medical History:  Diagnosis Date   Acute respiratory failure (HCC) 11/17/2018   Aphasia    Hypertension    Insomnia    Menopausal symptom    Menorrhagia    Pneumonia    Seasonal allergies    Tachycardia    Thyroid disease    Ventricular premature contractions     Past Surgical History:  Procedure Laterality Date   aneurysm surgery  09/2003   DILATION AND CURETTAGE OF UTERUS  2007    Allergies  Allergen Reactions   Ambien [Zolpidem]    Asa [Aspirin]    Benazepril Hcl    Carvedilol Other (See Comments)    GI upset   Codeine Sulfate Nausea Only   Metoprolol Tartrate    Other Other (See Comments)    Valtura   Aldactone [Spironolactone] Other (See Comments)    Abdominal pain    Hctz [Hydrochlorothiazide] Rash    Prior to Admission medications   Medication Sig Start Date End Date Taking? Authorizing Provider  acetaminophen (TYLENOL) 650 MG CR tablet Take 650 mg by mouth every 8 (eight) hours  as needed (pain or fever).   Yes [provider]  albuterol (VENTOLIN HFA) 108 (90 Base) MCG/ACT inhaler INL 2 PFS PO Q 4 TO 6 H PRN 04/24/22  Yes Johnson, Megan P, DO  cephALEXin (KEFLEX) 500 MG capsule Take 1 capsule (500 mg total) by mouth 4 (four) times daily. 07/07/22  Yes Holdsworth, Karen, NP  Eszopiclone 3 MG TABS Take 1 tablet (3 mg total) by mouth at bedtime. TAKE 1 TABLET BY MOUTH IMMEDIATELY BEFORE BEDTIME. MUST LAST 30 DAYS 04/24/22 07/23/22 Yes Johnson, Megan P, DO  fluticasone-salmeterol (ADVAIR) 100-50 MCG/ACT AEPB Inhale 1 puff into the lungs 2 (two) times daily. 04/24/22  Yes Johnson, Megan P, DO  furosemide (LASIX) 20 MG tablet TAKE 2 TABLETS(40 MG) BY MOUTH DAILY 05/09/21  Yes Johnson, Megan P, DO  HYDROcodone-acetaminophen (NORCO/VICODIN) 5-325 MG tablet Take 1 tablet by mouth every 8 (eight) hours. 07/07/22 07/07/23 Yes Holdsworth, Karen, NP  losartan (COZAAR) 100 MG tablet Take 100 mg by mouth daily. 07/31/21  Yes [provider]  methimazole (TAPAZOLE) 5 MG tablet Take 5 mg by mouth daily.    Yes [provider]  metoprolol succinate (TOPROL-XL) 25 MG 24 hr tablet Take 0.5 tablets (12.5 mg total) by mouth at bedtime. 04/24/22  Yes Johnson, Megan P, DO  montelukast (SINGULAIR) 10 MG tablet Take 1 tablet (10 mg total) by mouth at bedtime as needed (for allergies and stuffiness). TAKE 1   TABLET(10 MG) BY MOUTH AT BEDTIME 04/24/22  Yes Johnson, Megan P, DO  omeprazole (PRILOSEC) 20 MG capsule TAKE 1 CAPSULE(20 MG) BY MOUTH DAILY 04/20/22  Yes Johnson, Megan P, DO  pravastatin (PRAVACHOL) 20 MG tablet Take 1 tablet (20 mg total) by mouth every Monday, Wednesday, and Friday. 04/24/22  Yes Johnson, Megan P, DO  ondansetron (ZOFRAN) 4 MG tablet Take 4 mg by mouth every 8 (eight) hours as needed. Patient not taking: Reported on 07/08/2022 11/13/21   [provider]    Social History   Socioeconomic History   Marital status: Married    Spouse name: Not on file    Number of children: Not on file   Years of education: Not on file   Highest education level: Not on file  Occupational History   Not on file  Tobacco Use   Smoking status: Every Day    Packs/day: .25    Types: Cigarettes   Smokeless tobacco: Never  Vaping Use   Vaping Use: Never used  Substance and Sexual Activity   Alcohol use: No    Alcohol/week: 0.0 standard drinks of alcohol   Drug use: No   Sexual activity: Never  Other Topics Concern   Not on file  Social History Narrative   Not on file   Social Determinants of Health   Financial Resource Strain: Not on file  Food Insecurity: No Food Insecurity (07/08/2022)   Hunger Vital Sign    Worried About Running Out of Food in the Last Year: Never true    Ran Out of Food in the Last Year: Never true  Transportation Needs: No Transportation Needs (07/08/2022)   PRAPARE - Transportation    Lack of Transportation (Medical): No    Lack of Transportation (Non-Medical): No  Physical Activity: Not on file  Stress: Not on file  Social Connections: Not on file  Intimate Partner Violence: Not At Risk (07/08/2022)   Humiliation, Afraid, Rape, and Kick questionnaire    Fear of Current or Ex-Partner: No    Emotionally Abused: No    Physically Abused: No    Sexually Abused: No     Family History  Problem Relation Age of Onset   Stroke Mother    Diabetes Mother    Heart disease Mother    Hypertension Mother    Alcohol abuse Father    Cancer Father        liver   Diabetes Brother    Hypertension Brother    Heart disease Brother    Cancer Brother        colon    ROS: Otherwise negative unless mentioned in HPI  Physical Examination  Vitals:   07/08/22 1200 07/08/22 1230  BP: (!) 185/117 (!) 191/134  Pulse:    Resp: (!) 25 (!) 24  Temp:    SpO2:     Body mass index is 19.74 kg/m.  General:  WDWN in NAD Gait: Not observed HENT: WNL, normocephalic Pulmonary: normal non-labored breathing, without Rales, rhonchi,   wheezing Cardiac: regular, without  Murmurs, rubs or gallops; without carotid bruits Abdomen: Positive bowel sounds, soft, NT/ND, no masses Skin: without rashes Vascular Exam/Pulses: Non palpable pulses in bilateral lower extremities, Post tibial pulses Pos with doppler. Left post tibial very weak and difficult to find and hear.  Extremities: with ischemic changes, without Gangrene , with cellulitis; with open wounds;  Musculoskeletal: no muscle wasting or atrophy  Neurologic: A&O X 3;  No focal weakness or paresthesias are detected;   speech is fluent/normal Psychiatric:  The pt has Normal affect. Lymph:  Unremarkable  CBC    Component Value Date/Time   WBC 5.8 07/08/2022 0822   RBC 3.77 (L) 07/08/2022 0822   HGB 11.4 (L) 07/08/2022 0822   HGB 11.9 08/08/2021 1052   HCT 35.3 (L) 07/08/2022 0822   HCT 35.1 08/08/2021 1052   PLT 210 07/08/2022 0822   PLT 199 08/08/2021 1052   MCV 93.6 07/08/2022 0822   MCV 90 08/08/2021 1052   MCH 30.2 07/08/2022 0822   MCHC 32.3 07/08/2022 0822   RDW 14.9 07/08/2022 0822   RDW 14.1 08/08/2021 1052   LYMPHSABS 1.1 08/08/2021 1052   MONOABS 0.5 11/17/2018 1311   EOSABS 0.5 (H) 08/08/2021 1052   BASOSABS 0.1 08/08/2021 1052    BMET    Component Value Date/Time   NA 138 07/08/2022 0822   NA 140 08/08/2021 1052   K 3.6 07/08/2022 0822   CL 106 07/08/2022 0822   CO2 24 07/08/2022 0822   GLUCOSE 99 07/08/2022 0822   BUN 27 (H) 07/08/2022 0822   BUN 19 08/08/2021 1052   CREATININE 1.17 (H) 07/08/2022 0822   CALCIUM 9.0 07/08/2022 0822   GFRNONAA 51 (L) 07/08/2022 0822   GFRAA 81 08/21/2019 1012    COAGS: No results found for: "INR", "PROTIME"   Non-Invasive Vascular Imaging:   X-ray of feet: 1. No acute finding in either foot. No soft tissue gas or radiopaque foreign body, and no osseous erosion or destruction. 2. Screw fixation of the proximal metaphysis of the first metatarsal on the right without evidence of complication. 3. Mild  hallux valgus deformity bilaterally.  LOWER EXTREMITY DOPPLER STUDY   Patient Name:  Tierrah C Yount  Date of Exam:   06/24/2022  Medical Rec #: [id number]      Accession #:    [id number]  Date of Birth: 10/06/1955      Patient Gender: F  Patient Age:   67 years  Exam Location:  [facility]  Procedure:      VAS US ABI WITH/WO TBI  Referring Phys: FALLON BROWN    ---------------------------------------------------------------------------  -----    Indications: Claudication, rest pain, and peripheral artery disease. Known               aneurysms   High Risk Factors: Hypertension, current smoker.     Performing Technologist: Matthew Cravey RVT     Examination Guidelines: A complete evaluation includes at minimum, Doppler  waveform signals and systolic blood pressure reading at the level of  bilateral  brachial, anterior tibial, and posterior tibial arteries, when vessel  segments  are accessible. Bilateral testing is considered an integral part of a  complete  examination. Photoelectric Plethysmograph (PPG) waveforms and toe systolic  pressure readings are included as required and additional duplex testing  as  needed. Limited examinations for reoccurring indications may be performed  as  noted.     ABI Findings:  +---------+------------------+-----+----------+--------+  Right   Rt Pressure (mmHg)IndexWaveform  Comment   +---------+------------------+-----+----------+--------+  Brachial 176                                        +---------+------------------+-----+----------+--------+  PTA     112               0.64 monophasic          +---------+------------------+-----+----------+--------+    DP      78                0.44 monophasic          +---------+------------------+-----+----------+--------+  Great Toe24                0.14                     +---------+------------------+-----+----------+--------+    +---------+------------------+-----+----------+-------+  Left    Lt Pressure (mmHg)IndexWaveform  Comment  +---------+------------------+-----+----------+-------+  Brachial 157                                       +---------+------------------+-----+----------+-------+  PTA     139               0.79 monophasic         +---------+------------------+-----+----------+-------+  DP      114               0.65 monophasic         +---------+------------------+-----+----------+-------+  Great Toe0                 0.00                    +---------+------------------+-----+----------+-------+   +-------+-----------+-----------+------------+------------+  ABI/TBIToday's ABIToday's TBIPrevious ABIPrevious TBI  +-------+-----------+-----------+------------+------------+  Right 0.64       0.14                                 +-------+-----------+-----------+------------+------------+  Left  0.79       0.00                                 +-------+-----------+-----------+------------+------------+         Summary:  Right: Resting right ankle-brachial index indicates moderate right lower  extremity arterial disease. The right toe-brachial index is abnormal.   Left: Resting left ankle-brachial index indicates moderate left lower  extremity arterial disease. The left toe-brachial index is abnormal.    Statin:  Yes.   Beta Blocker:  Yes.   Aspirin:  No. ACEI:  No. ARB:  Yes.   CCB use:  No Other antiplatelets/anticoagulants:  No.    ASSESSMENT/PLAN: This is a 67 y.o. female who presents to [facility]'s emergency department for worsening pain of bilateral lower extremities with purple toes.  Patient noted to have 2 weeks ago ABI and TBI ultrasounds.  Patient's right TBI noted to be 0.14 and left TBI noted to be 0.  Doppler pulses were found in the posterior tibial artery right greater than left.  Left posterior tibial artery Doppler pulse was  difficult to find and very weak.  All toes on bilateral lower extremities are purple today.  Patient endorses extreme pain when you touch her feet.  Bilateral lower extremities to the ankles are cool to touch.  PLAN: Vascular surgery plans on taking the patient to the vascular lab on 2 separate occasions for bilateral lower extremity angiograms with possible intervention.  Patient will be taken to the lab this Friday, 07/10/2022 for a left lower extremity angiogram with possible intervention and will be taken the following Tuesday, 07/14/2022 for a right lower extremity angiogram with possible intervention.  I discussed in detail today with   the patient her husband and her daughter at the bedside the procedure, benefits, complications, and risks.  All family members verbalized their understanding.  I answered all of the patient's and family's questions this morning.  The patient and the family would like to proceed with the procedure as soon as possible.  Patient will be made n.p.o. after midnight on Friday, 07/10/2022.   -This plan was discussed in detail with Dr. Jason Dew MD and Dr. Gregory Schnier MD for both procedures and they both agree with the plan.   Boston Cookson R Timesha [facility] 07/08/2022 12:41 PM  

## 2022-07-08 NOTE — ED Notes (Signed)
Sec notified to arrange transportation to floor.

## 2022-07-08 NOTE — Consult Note (Signed)
PHARMACY -  BRIEF ANTIBIOTIC NOTE   Pharmacy has received consult(s) for Vancomycin from an ED provider.  The patient's profile has been reviewed for ht/wt/allergies/indication/available labs.    One time order(s) placed for : Vancomycin 1250mg  IV x 1 dose  Further antibiotics/pharmacy consults should be ordered by admitting physician if indicated.                       Thank you, Taler Kushner Rodriguez-Guzman PharmD, BCPS 07/08/2022 10:02 AM

## 2022-07-08 NOTE — Consult Note (Signed)
Pharmacy Antibiotic Note  Jasmine Camacho is a 67 y.o. female admitted on 07/08/2022 with  foot infection .  Pharmacy has been consulted for Vancomycin dosing.  Noted Scr significantly elevated from baseline  and BNP > 4000. AKI/cardiorenal syndrome.  Plan: Vancomycin 1250 mg IV x 1 dose today Variable dose based on renal function Will follow up AM labs and cultures. Adjust therapy as needed.   Height: 5\' 4"  (162.6 cm) Weight: 52.2 kg (115 lb) IBW/kg (Calculated) : 54.7  Temp (24hrs), Avg:98.2 F (36.8 C), Min:98 F (36.7 C), Max:98.4 F (36.9 C)  Recent Labs  Lab 07/08/22 0822 07/08/22 0911  WBC 5.8  --   CREATININE 1.17*  --   LATICACIDVEN  --  0.9    Estimated Creatinine Clearance: 39 mL/min (A) (by C-G formula based on SCr of 1.17 mg/dL (H)).    Allergies  Allergen Reactions   Ambien [Zolpidem]    Asa [Aspirin]    Benazepril Hcl    Carvedilol Other (See Comments)    GI upset   Codeine Sulfate Nausea Only   Metoprolol Tartrate    Other Other (See Comments)    Valtura   Aldactone [Spironolactone] Other (See Comments)    Abdominal pain    Hctz [Hydrochlorothiazide] Rash    Antimicrobials this admission: Vancomycin  5/29 >>   Dose adjustments this admission: n/a  Microbiology results: 5/29 BCx: in process  Thank you for allowing pharmacy to be a part of this patient's care.  Zuleima Haser Rodriguez-Guzman PharmD, BCPS 07/08/2022 1:11 PM

## 2022-07-09 DIAGNOSIS — I739 Peripheral vascular disease, unspecified: Secondary | ICD-10-CM | POA: Diagnosis not present

## 2022-07-09 DIAGNOSIS — L089 Local infection of the skin and subcutaneous tissue, unspecified: Secondary | ICD-10-CM | POA: Diagnosis not present

## 2022-07-09 DIAGNOSIS — F17200 Nicotine dependence, unspecified, uncomplicated: Secondary | ICD-10-CM | POA: Diagnosis not present

## 2022-07-09 LAB — CBC
HCT: 33.9 % — ABNORMAL LOW (ref 36.0–46.0)
Hemoglobin: 11.3 g/dL — ABNORMAL LOW (ref 12.0–15.0)
MCH: 30.7 pg (ref 26.0–34.0)
MCHC: 33.3 g/dL (ref 30.0–36.0)
MCV: 92.1 fL (ref 80.0–100.0)
Platelets: 202 10*3/uL (ref 150–400)
RBC: 3.68 MIL/uL — ABNORMAL LOW (ref 3.87–5.11)
RDW: 15 % (ref 11.5–15.5)
WBC: 6.1 10*3/uL (ref 4.0–10.5)
nRBC: 0 % (ref 0.0–0.2)

## 2022-07-09 LAB — BASIC METABOLIC PANEL WITH GFR
Anion gap: 12 (ref 5–15)
BUN: 26 mg/dL — ABNORMAL HIGH (ref 8–23)
CO2: 20 mmol/L — ABNORMAL LOW (ref 22–32)
Calcium: 8.9 mg/dL (ref 8.9–10.3)
Chloride: 106 mmol/L (ref 98–111)
Creatinine, Ser: 1.18 mg/dL — ABNORMAL HIGH (ref 0.44–1.00)
GFR, Estimated: 51 mL/min — ABNORMAL LOW
Glucose, Bld: 92 mg/dL (ref 70–99)
Potassium: 3.7 mmol/L (ref 3.5–5.1)
Sodium: 138 mmol/L (ref 135–145)

## 2022-07-09 LAB — CULTURE, BLOOD (ROUTINE X 2)

## 2022-07-09 LAB — MRSA NEXT GEN BY PCR, NASAL: MRSA by PCR Next Gen: NOT DETECTED

## 2022-07-09 LAB — HIV ANTIBODY (ROUTINE TESTING W REFLEX): HIV Screen 4th Generation wRfx: NONREACTIVE

## 2022-07-09 LAB — GLUCOSE, CAPILLARY: Glucose-Capillary: 82 mg/dL (ref 70–99)

## 2022-07-09 LAB — HEMOGLOBIN A1C
Hgb A1c MFr Bld: 5.4 % (ref 4.8–5.6)
Mean Plasma Glucose: 108 mg/dL

## 2022-07-09 LAB — C-REACTIVE PROTEIN: CRP: 2 mg/dL — ABNORMAL HIGH (ref <1.0)

## 2022-07-09 MED ORDER — HYDRALAZINE HCL 20 MG/ML IJ SOLN
10.0000 mg | INTRAMUSCULAR | Status: DC | PRN
  Administered 2022-07-09 – 2022-07-10 (×5): 10 mg via INTRAVENOUS
  Filled 2022-07-09 (×3): qty 1

## 2022-07-09 MED ORDER — AMLODIPINE BESYLATE 5 MG PO TABS
5.0000 mg | ORAL_TABLET | Freq: Every day | ORAL | Status: DC
  Administered 2022-07-09 – 2022-07-10 (×2): 5 mg via ORAL
  Filled 2022-07-09 (×2): qty 1

## 2022-07-09 MED ORDER — ENOXAPARIN SODIUM 40 MG/0.4ML IJ SOSY
40.0000 mg | PREFILLED_SYRINGE | INTRAMUSCULAR | Status: DC
  Administered 2022-07-09: 40 mg via SUBCUTANEOUS
  Filled 2022-07-09: qty 0.4

## 2022-07-09 MED ORDER — CHLORHEXIDINE GLUCONATE 4 % EX SOLN
60.0000 mL | Freq: Once | CUTANEOUS | Status: AC
  Administered 2022-07-10: 4 via TOPICAL

## 2022-07-09 MED ORDER — CHLORHEXIDINE GLUCONATE 4 % EX SOLN
60.0000 mL | Freq: Once | CUTANEOUS | Status: AC
  Administered 2022-07-09: 4 via TOPICAL

## 2022-07-09 NOTE — Consult Note (Addendum)
Reason for Consult: Discolored toes, infection Referring Physician: Dr. Vertis Kelch is an 67 y.o. female.  HPI: Patient has a history of tobacco use and is known to vascular surgery here in [place], had plan for outpatient angiography, noticed her toes becoming quite blue over the last couple of days and severely worsened overnight so presented to the ER due to the pain and discoloration.  Patient seen in the ER for evaluation today  Past Medical History:  Diagnosis Date   Acute respiratory failure (HCC) 11/17/2018   Aphasia    Hypertension    Insomnia    Menopausal symptom    Menorrhagia    Pneumonia    Seasonal allergies    Tachycardia    Thyroid disease    Ventricular premature contractions     Past Surgical History:  Procedure Laterality Date   aneurysm surgery  09/2003   DILATION AND CURETTAGE OF UTERUS  2007    Family History  Problem Relation Age of Onset   Stroke Mother    Diabetes Mother    Heart disease Mother    Hypertension Mother    Alcohol abuse Father    Cancer Father        liver   Diabetes Brother    Hypertension Brother    Heart disease Brother    Cancer Brother        colon    Social History:  reports that she has been smoking cigarettes. She has been smoking an average of .25 packs per day. She has never used smokeless tobacco. She reports that she does not drink alcohol and does not use drugs.  Allergies:  Allergies  Allergen Reactions   Ambien [Zolpidem]    Asa [Aspirin]    Benazepril Hcl    Carvedilol Other (See Comments)    GI upset   Codeine Sulfate Nausea Only   Metoprolol Tartrate    Other Other (See Comments)    Valtura   Aldactone [Spironolactone] Other (See Comments)    Abdominal pain    Hctz [Hydrochlorothiazide] Rash    Medications: I have reviewed the patient's current medications.  Results for orders placed or performed during the hospital encounter of 07/08/22 (from the past 48 hour(s))  Basic metabolic  panel     Status: Abnormal   Collection Time: 07/08/22  8:22 AM  Result Value Ref Range   Sodium 138 135 - 145 mmol/L   Potassium 3.6 3.5 - 5.1 mmol/L   Chloride 106 98 - 111 mmol/L   CO2 24 22 - 32 mmol/L   Glucose, Bld 99 70 - 99 mg/dL    Comment: Glucose reference range applies only to samples taken after fasting for at least 8 hours.   BUN 27 (H) 8 - 23 mg/dL   Creatinine, Ser 0.98 (H) 0.44 - 1.00 mg/dL   Calcium 9.0 8.9 - 11.9 mg/dL   GFR, Estimated 51 (L) >60 mL/min    Comment: (NOTE) Calculated using the CKD-EPI Creatinine Equation (2021)    Anion gap 8 5 - 15    Comment: Performed at [facility], [address] [place], [place] [address]  CBC     Status: Abnormal   Collection Time: 07/08/22  8:22 AM  Result Value Ref Range   WBC 5.8 4.0 - 10.5 K/uL   RBC 3.77 (L) 3.87 - 5.11 MIL/uL   Hemoglobin 11.4 (L) 12.0 - 15.0 g/dL   HCT 95.6 (L) 21.3 - 08.6 %   MCV  93.6 80.0 - 100.0 fL   MCH 30.2 26.0 - 34.0 pg   MCHC 32.3 30.0 - 36.0 g/dL   RDW 40.9 81.1 - 91.4 %   Platelets 210 150 - 400 K/uL   nRBC 0.0 0.0 - 0.2 %    Comment: Performed at [facility] [address], [place], [place] [address]  Troponin I (High Sensitivity)     Status: Abnormal   Collection Time: 07/08/22  8:22 AM  Result Value Ref Range   Troponin I (High Sensitivity) 45 (H) <18 ng/L    Comment: (NOTE) Elevated high sensitivity troponin I (hsTnI) values and significant  changes across serial measurements may suggest ACS but many other  chronic and acute conditions are known to elevate hsTnI results.  Refer to the "Links" section for chest pain algorithms and additional  guidance. Performed at [facility], [address] [place], [place] [address]   Brain natriuretic peptide     Status: Abnormal   Collection Time: 07/08/22  8:22 AM  Result Value Ref Range   B Natriuretic Peptide >4,500.0 (H) 0.0 - 100.0 pg/mL    Comment: Performed at [facility], [address] [place], [place] [address]  Sedimentation rate     Status: None   Collection Time: 07/08/22  8:22 AM  Result Value Ref Range   Sed Rate 30 0 - 30 mm/hr    Comment: Performed at [facility], [address] [place], [place] [address]  Lactic acid, plasma     Status: None   Collection Time: 07/08/22  9:11 AM  Result Value Ref Range   Lactic Acid, Venous 0.9 0.5 - 1.9 mmol/L    Comment: Performed at [facility], [address] [place], [place] [address]  Culture, blood (routine x 2)     Status: None (Preliminary result)   Collection Time: 07/08/22  9:11 AM   Specimen: BLOOD  Result Value Ref Range   Specimen Description BLOOD RIGHT AC    Special Requests      BOTTLES DRAWN AEROBIC AND ANAEROBIC Blood Culture adequate volume   Culture      NO GROWTH < 24 HOURS Performed at [facility], [address], [place], [place] [address]    Report Status PENDING   Culture, blood (routine x 2)     Status: None (Preliminary result)   Collection Time: 07/08/22  9:11 AM   Specimen: BLOOD  Result Value Ref Range   Specimen Description BLOOD LEFT FA    Special Requests      BOTTLES DRAWN AEROBIC AND ANAEROBIC Blood Culture adequate volume   Culture      NO GROWTH < 24 HOURS Performed at [facility], [address], [place], [place] [address]    Report Status PENDING   Troponin I (High Sensitivity)     Status: Abnormal   Collection Time: 07/08/22 10:19 AM  Result Value Ref Range   Troponin I (High Sensitivity) 41 (H) <18 ng/L    Comment: (NOTE) Elevated high sensitivity troponin I (hsTnI) values and significant  changes across serial measurements may suggest ACS but many other  chronic and acute conditions are known to elevate hsTnI results.  Refer to the "Links" section for chest pain algorithms and additional  guidance. Performed at [facility], [address] [place], [place] [address]   Lipid panel     Status:  Abnormal   Collection Time: 07/08/22 10:19 AM  Result Value Ref  Range   Cholesterol 111 0 - 200 mg/dL   Triglycerides 61 <161 mg/dL   HDL 37 (L) >09 mg/dL   Total CHOL/HDL Ratio 3.0 RATIO   VLDL 12 0 - 40 mg/dL   LDL Cholesterol 62 0 - 99 mg/dL    Comment:        Total Cholesterol/HDL:CHD Risk Coronary Heart Disease Risk Table                     Men   Women  1/2 Average Risk   3.4   3.3  Average Risk       5.0   4.4  2 X Average Risk   9.6   7.1  3 X Average Risk  23.4   11.0        Use the calculated Patient Ratio above and the CHD Risk Table to determine the patient's CHD Risk.        ATP III CLASSIFICATION (LDL):  <100     mg/dL   Optimal  604-540  mg/dL   Near or Above                    Optimal  130-159  mg/dL   Borderline  981-191  mg/dL   High  >478     mg/dL   Very High Performed at [facility], [address] [place], [place] [address]   C-reactive protein     Status: Abnormal   Collection Time: 07/08/22  7:13 PM  Result Value Ref Range   CRP 2.0 (H) <1.0 mg/dL    Comment: Performed at [facility], [address], [place], [place] [address]  Protime-INR     Status: Abnormal   Collection Time: 07/08/22  7:13 PM  Result Value Ref Range   Prothrombin Time 15.9 (H) 11.4 - 15.2 seconds   INR 1.3 (H) 0.8 - 1.2    Comment: (NOTE) INR goal varies based on device and disease states. Performed at [facility], [address] [place], [place] [address]   APTT     Status: Abnormal   Collection Time: 07/08/22  7:13 PM  Result Value Ref Range   aPTT 41 (H) 24 - 36 seconds    Comment:        IF BASELINE aPTT IS ELEVATED, SUGGEST PATIENT RISK ASSESSMENT BE USED TO DETERMINE APPROPRIATE ANTICOAGULANT THERAPY. Performed at [facility], [address] [place], [place] [address]   Basic metabolic panel     Status: Abnormal   Collection Time: 07/09/22  5:02 AM  Result Value Ref Range   Sodium 138 135 - 145 mmol/L   Potassium  3.7 3.5 - 5.1 mmol/L   Chloride 106 98 - 111 mmol/L   CO2 20 (L) 22 - 32 mmol/L   Glucose, Bld 92 70 - 99 mg/dL    Comment: Glucose reference range applies only to samples taken after fasting for at least 8 hours.   BUN 26 (H) 8 - 23 mg/dL   Creatinine, Ser 2.84 (H) 0.44 - 1.00 mg/dL   Calcium 8.9 8.9 - 13.2 mg/dL   GFR, Estimated 51 (L) >60 mL/min    Comment: (NOTE) Calculated using the CKD-EPI Creatinine Equation (2021)    Anion gap 12 5 - 15    Comment: Performed at [facility], [address], [place], [place] [address]  CBC     Status: Abnormal   Collection Time: 07/09/22  5:02 AM  Result Value Ref Range   WBC 6.1 4.0 - 10.5 K/uL   RBC 3.68 (L) 3.87 - 5.11 MIL/uL   Hemoglobin 11.3 (L) 12.0 - 15.0 g/dL   HCT 16.1 (L) 09.6 - 04.5 %   MCV 92.1 80.0 - 100.0 fL   MCH 30.7 26.0 - 34.0 pg   MCHC 33.3 30.0 - 36.0 g/dL   RDW 40.9 81.1 - 91.4 %   Platelets 202 150 - 400 K/uL   nRBC 0.0 0.0 - 0.2 %    Comment: Performed at [facility], [address], [place], [place] [address]    DG Foot Complete Left  Result Date: 07/08/2022 CLINICAL DATA:  Hypertension and black toes. Bilateral foot and toe pain. EXAM: LEFT FOOT - COMPLETE 3+ VIEW; RIGHT FOOT COMPLETE - 3+ VIEW COMPARISON:  None Available. FINDINGS: Right: There is no acute fracture or dislocation. There is mild hallux valgus deformity. Alignment is otherwise normal. Screw fixation of the proximal metaphysis of the first metatarsal is noted without evidence of complication. There is no perihardware lucency. There is no bony erosion. The joint spaces are overall preserved. There is mild Achilles enthesopathy. The soft tissues are unremarkable. There is no soft tissue gas or radiopaque foreign body. Left: There is no acute fracture or dislocation. There is mild hallux valgus deformity. Alignment is otherwise normal. There is no osseous erosion or destruction. The joint spaces are overall preserved. There is no soft  tissue gas or radiopaque foreign body. IMPRESSION: 1. No acute finding in either foot. No soft tissue gas or radiopaque foreign body, and no osseous erosion or destruction. 2. Screw fixation of the proximal metaphysis of the first metatarsal on the right without evidence of complication. 3. Mild hallux valgus deformity bilaterally. Electronically Signed   By: Lesia Hausen M.D.   On: 07/08/2022 09:53   DG Foot Complete Right  Result Date: 07/08/2022 CLINICAL DATA:  Hypertension and black toes. Bilateral foot and toe pain. EXAM: LEFT FOOT - COMPLETE 3+ VIEW; RIGHT FOOT COMPLETE - 3+ VIEW COMPARISON:  None Available. FINDINGS: Right: There is no acute fracture or dislocation. There is mild hallux valgus deformity. Alignment is otherwise normal. Screw fixation of the proximal metaphysis of the first metatarsal is noted without evidence of complication. There is no perihardware lucency. There is no bony erosion. The joint spaces are overall preserved. There is mild Achilles enthesopathy. The soft tissues are unremarkable. There is no soft tissue gas or radiopaque foreign body. Left: There is no acute fracture or dislocation. There is mild hallux valgus deformity. Alignment is otherwise normal. There is no osseous erosion or destruction. The joint spaces are overall preserved. There is no soft tissue gas or radiopaque foreign body. IMPRESSION: 1. No acute finding in either foot. No soft tissue gas or radiopaque foreign body, and no osseous erosion or destruction. 2. Screw fixation of the proximal metaphysis of the first metatarsal on the right without evidence of complication. 3. Mild hallux valgus deformity bilaterally. Electronically Signed   By: Lesia Hausen M.D.   On: 07/08/2022 09:53   DG Chest 2 View  Result Date: 07/08/2022 CLINICAL DATA:  Discoloration of the toes EXAM: CHEST - 2 VIEW COMPARISON:  Chest radiograph dated 11/17/2018 FINDINGS: Normal lung volumes. Bilateral interstitial opacities. No pleural  effusion or pneumothorax. Enlarged cardiomediastinal silhouette. No acute osseous abnormality. IMPRESSION: 1. Mild bilateral pulmonary edema. 2. Enlarged cardiomediastinal silhouette. Electronically Signed   By: Agustin Cree M.D.   On: 07/08/2022 08:57  Review of Systems  Constitutional:  Negative for chills and fever.  Respiratory:  Negative for shortness of breath.   Cardiovascular:  Negative for chest pain.  Gastrointestinal:  Negative for nausea and vomiting.  Musculoskeletal:  Positive for joint pain (Pain in feet).  Skin:        Toes blue and a small ulcer   Blood pressure (!) 142/93, pulse 77, temperature 98.6 F (37 C), resp. rate 18, height 5\' 4"  (1.626 m), weight 52.2 kg, SpO2 93 %.  Vitals:   07/09/22 0450 07/09/22 0609  BP: (!) 188/115 (!) 142/93  Pulse: 99 77  Resp: 18   Temp: 98.6 F (37 C)   SpO2: 93%     General AA&O x3. Normal mood and affect.  Vascular Nonpalpable DP and PT pulse, dependent rubor, sluggish cap fill time  Neurologic Epicritic sensation grossly present.  Dermatologic (Wound) Small gangrenous patch tip of fourth toe bilateral  Orthopedic: Motor intact BLE.            Assessment/Plan:  Critical limb ischemia, PAD -Imaging: Studies independently reviewed.  Do not see indication for MRI -Antibiotics: Can treat with 7 to 10 days of antibiotics for cellulitis, cover for gram-positive's with a cephalosporin, she lives at home and is at low risk for MRSA or Pseudomonas -WB Status: WBAT -Wound Care: Can leave open to air, if wounds progress can apply Betadine paint -Currently has PAD with small ulcerations developing on the tips of the toes.  Do not see indication for amputation at this point.  If this progresses will need time to demarcate fully.  If vascular is able to revascularize the bilateral extremities hopefully these can heal with local wound care. -She may follow-up with our practice or with the wound care clinic following discharge.  We  will not follow daily while in house, please let us know if there are any acute changes that need reevaluation and we will be happy to see her again  Edwin Cap 07/09/2022, 7:28 AM   Best available via secure chat for questions or concerns.

## 2022-07-09 NOTE — Progress Notes (Signed)
       CROSS COVER NOTE  NAME: Jasmine Camacho MRN: [id number] DOB : 05/23/1955    HPI/Events of Note   Report:67 year old female,came in for discolored toes and leg pain where Kflex was given with no improvement. Previous BP's have been elevated. I utilized hydralazine PRN at 0103 for BP of 173/111, now BP is 180/105 with HR of 84. Patient was also given metoprolol scheduled at 2136. PRN medicine not helping. She has elevated BP's all day. Patient had a history of previous stroke and triple A   On review of chart: H&P reviewed, med list with scheduled and as needed meds reviewed    Assessment and  Interventions   Assessment: Hypertensive urgency  Plan: Add amlodipine 5 mg daily Increase hydralazine as needed to 10 mg IV every 4 Continue home losartan 100 mg and Toprol-XL 12.5 mg

## 2022-07-09 NOTE — Telephone Encounter (Signed)
Spoke with pt's husband and pt is currently admitted.

## 2022-07-09 NOTE — Progress Notes (Addendum)
PROGRESS NOTE    Jasmine SHOAF  ZOX:[id number] DOB: [birth year] DOA: 07/08/2022 PCP: Dorcas Carrow, DO   Assessment & Plan:   Principal Problem:   PVD (peripheral vascular disease) (HCC) Active Problems:   Toe infection   Hypertension   Chronic systolic CHF (congestive heart failure) (HCC)   Myocardial injury   Chronic obstructive pulmonary disease (HCC)   HLD (hyperlipidemia)   Hyperthyroidism   CKD (chronic kidney disease) stage 2, GFR 60-89 ml/min   Tobacco dependence  Assessment and Plan: PVD: lower extremity angiogram with possible intervention tomorrow as per vasc surg. NPO after midnight. Percocet, morphine prn for pain. Vasc surg following and recs apprec    Toe infection: continue on IV rocephin. D/c vanco. No acute surgery needed at this time. Will need outpatient f/u w/ podiatry    HTN: continue on metoprolol, losartan, lasix. IV hydralazine prn    Chronic systolic CHF:  echo on 07/04/2021 showed EF 20%.  Patient has elevated BNP > 4500, but no leg edema or JVD.  No worsening shortness breath.  Chest x-ray showed mild pulmonary edema. Continue on lasix. Monitor I/Os    Myocardial injury: Troponin level 45 --> 41, no chest pain.   COPD: w/o exacerbation. Continue on bronchodilators prn    HLD: not currently taking a statin  Hyperthyroidism: continue on home dose of methimazole    CKDII: Cr is trending up slightly from day prior. Avoid nephrotoxic meds    Tobacco dependence: nicotine patch to prevent w/drawal. Smoking cessation counseling x 5 mins       DVT prophylaxis: lovenox  Code Status: full  Family Communication:discussed pt's care w/ pt's husband at bedside and answered his questions  Disposition Plan: likely d/c back home   Level of care: Telemetry Cardiac Status is: Inpatient Remains inpatient appropriate because: severity of illness, going for angiogram tomorrow      Consultants:  Vasc surg Podiatry   Procedures:   Antimicrobials:  rocephin   Subjective: Pt c/o foot pain   Objective: Vitals:   07/09/22 0146 07/09/22 0251 07/09/22 0450 07/09/22 0609  BP: (!) 180/105 (!) 161/102 (!) 188/115 (!) 142/93  Pulse: 93 84 99 77  Resp:   18   Temp:   98.6 F (37 C)   TempSrc:      SpO2:   93%   Weight:      Height:        Intake/Output Summary (Last 24 hours) at 07/09/2022 0835 Last data filed at 07/08/2022 1845 Gross per 24 hour  Intake 100 ml  Output --  Net 100 ml   Filed Weights   07/08/22 0812  Weight: 52.2 kg    Examination:  General exam: Appears calm and comfortable  Respiratory system: Clear to auscultation. Respiratory effort normal. Cardiovascular system: S1 & S2+. No rubs, gallops or clicks.  Gastrointestinal system: Abdomen is nondistended, soft and nontender.Normal bowel sounds heard. Central nervous system: Alert and oriented. Psychiatry: Judgement and insight appear normal. Mood & affect appropriate.     Data Reviewed: I have personally reviewed following labs and imaging studies  CBC: Recent Labs  Lab 07/08/22 0822 07/09/22 0502  WBC 5.8 6.1  HGB 11.4* 11.3*  HCT 35.3* 33.9*  MCV 93.6 92.1  PLT 210 202   Basic Metabolic Panel: Recent Labs  Lab 07/08/22 0822 07/09/22 0502  NA 138 138  K 3.6 3.7  CL 106 106  CO2 24 20*  GLUCOSE 99 92  BUN 27* 26*  CREATININE  1.17* 1.18*  CALCIUM 9.0 8.9   GFR: Estimated Creatinine Clearance: 38.6 mL/min (A) (by C-G formula based on SCr of 1.18 mg/dL (H)). Liver Function Tests: No results for input(s): "AST", "ALT", "ALKPHOS", "BILITOT", "PROT", "ALBUMIN" in the last 168 hours. No results for input(s): "LIPASE", "AMYLASE" in the last 168 hours. No results for input(s): "AMMONIA" in the last 168 hours. Coagulation Profile: Recent Labs  Lab 07/08/22 1913  INR 1.3*   Cardiac Enzymes: No results for input(s): "CKTOTAL", "CKMB", "CKMBINDEX", "TROPONINI" in the last 168 hours. BNP (last 3 results) No results for input(s): "PROBNP"  in the last 8760 hours. HbA1C: No results for input(s): "HGBA1C" in the last 72 hours. CBG: Recent Labs  Lab 07/09/22 0737  GLUCAP 82   Lipid Profile: Recent Labs    07/08/22 1019  CHOL 111  HDL 37*  LDLCALC 62  TRIG 61  CHOLHDL 3.0   Thyroid Function Tests: No results for input(s): "TSH", "T4TOTAL", "FREET4", "T3FREE", "THYROIDAB" in the last 72 hours. Anemia Panel: No results for input(s): "VITAMINB12", "FOLATE", "FERRITIN", "TIBC", "IRON", "RETICCTPCT" in the last 72 hours. Sepsis Labs: Recent Labs  Lab 07/08/22 0911  LATICACIDVEN 0.9    Recent Results (from the past 240 hour(s))  Culture, blood (routine x 2)     Status: None (Preliminary result)   Collection Time: 07/08/22  9:11 AM   Specimen: BLOOD  Result Value Ref Range Status   Specimen Description BLOOD RIGHT [facility]  Final   Special Requests   Final    BOTTLES DRAWN AEROBIC AND ANAEROBIC Blood Culture adequate volume   Culture   Final    NO GROWTH < 24 HOURS Performed at [facility], [address] [place], [place] [address]    Report Status PENDING  Incomplete  Culture, blood (routine x 2)     Status: None (Preliminary result)   Collection Time: 07/08/22  9:11 AM   Specimen: BLOOD  Result Value Ref Range Status   Specimen Description BLOOD LEFT FA  Final   Special Requests   Final    BOTTLES DRAWN AEROBIC AND ANAEROBIC Blood Culture adequate volume   Culture   Final    NO GROWTH < 24 HOURS Performed at [facility], [address] [place], [place] [address]    Report Status PENDING  Incomplete         Radiology Studies: DG Foot Complete Left  Result Date: 07/08/2022 CLINICAL DATA:  Hypertension and black toes. Bilateral foot and toe pain. EXAM: LEFT FOOT - COMPLETE 3+ VIEW; RIGHT FOOT COMPLETE - 3+ VIEW COMPARISON:  None Available. FINDINGS: Right: There is no acute fracture or dislocation. There is mild hallux valgus deformity. Alignment is otherwise normal. Screw fixation  of the proximal metaphysis of the first metatarsal is noted without evidence of complication. There is no perihardware lucency. There is no bony erosion. The joint spaces are overall preserved. There is mild Achilles enthesopathy. The soft tissues are unremarkable. There is no soft tissue gas or radiopaque foreign body. Left: There is no acute fracture or dislocation. There is mild hallux valgus deformity. Alignment is otherwise normal. There is no osseous erosion or destruction. The joint spaces are overall preserved. There is no soft tissue gas or radiopaque foreign body. IMPRESSION: 1. No acute finding in either foot. No soft tissue gas or radiopaque foreign body, and no osseous erosion or destruction. 2. Screw fixation of the proximal metaphysis of the first metatarsal on the right without evidence of complication. 3.  Mild hallux valgus deformity bilaterally. Electronically Signed   By: Lesia Hausen M.D.   On: 07/08/2022 09:53   DG Foot Complete Right  Result Date: 07/08/2022 CLINICAL DATA:  Hypertension and black toes. Bilateral foot and toe pain. EXAM: LEFT FOOT - COMPLETE 3+ VIEW; RIGHT FOOT COMPLETE - 3+ VIEW COMPARISON:  None Available. FINDINGS: Right: There is no acute fracture or dislocation. There is mild hallux valgus deformity. Alignment is otherwise normal. Screw fixation of the proximal metaphysis of the first metatarsal is noted without evidence of complication. There is no perihardware lucency. There is no bony erosion. The joint spaces are overall preserved. There is mild Achilles enthesopathy. The soft tissues are unremarkable. There is no soft tissue gas or radiopaque foreign body. Left: There is no acute fracture or dislocation. There is mild hallux valgus deformity. Alignment is otherwise normal. There is no osseous erosion or destruction. The joint spaces are overall preserved. There is no soft tissue gas or radiopaque foreign body. IMPRESSION: 1. No acute finding in either foot. No soft  tissue gas or radiopaque foreign body, and no osseous erosion or destruction. 2. Screw fixation of the proximal metaphysis of the first metatarsal on the right without evidence of complication. 3. Mild hallux valgus deformity bilaterally. Electronically Signed   By: Lesia Hausen M.D.   On: 07/08/2022 09:53   DG Chest 2 View  Result Date: 07/08/2022 CLINICAL DATA:  Discoloration of the toes EXAM: CHEST - 2 VIEW COMPARISON:  Chest radiograph dated 11/17/2018 FINDINGS: Normal lung volumes. Bilateral interstitial opacities. No pleural effusion or pneumothorax. Enlarged cardiomediastinal silhouette. No acute osseous abnormality. IMPRESSION: 1. Mild bilateral pulmonary edema. 2. Enlarged cardiomediastinal silhouette. Electronically Signed   By: Agustin Cree M.D.   On: 07/08/2022 08:57        Scheduled Meds:  amLODipine  5 mg Oral Daily   Eszopiclone  3 mg Oral QHS   furosemide  20 mg Oral Daily   losartan  100 mg Oral Daily   methimazole  5 mg Oral Daily   metoprolol succinate  12.5 mg Oral QHS   mometasone-formoterol  2 puff Inhalation BID   nicotine  21 mg Transdermal Daily   pantoprazole  40 mg Oral Daily   vancomycin variable dose per unstable renal function (pharmacist dosing)   Does not apply See admin instructions   Continuous Infusions:  cefTRIAXone (ROCEPHIN)  IV Stopped (07/08/22 1552)     LOS: 1 day    Time spent: 35 mins     Charise Killian, MD [facility] Pager [phone]  If 7PM-7AM, please contact night-coverage [email or web address] 07/09/2022, 8:35 AM

## 2022-07-09 NOTE — Plan of Care (Signed)

## 2022-07-09 NOTE — Progress Notes (Signed)
Patients BP's have been elevated without improvement of PRN medications.  Physician notified.

## 2022-07-09 NOTE — Care Management Important Message (Signed)
Important Message  Patient Details  Name: Jasmine Camacho MRN: [id number] Date of Birth: 06-20-1955   Medicare Important Message Given:  N/A - LOS <3 / Initial given by admissions     Jasmine Camacho 07/09/2022, 2:42 PM

## 2022-07-10 ENCOUNTER — Encounter
Admission: EM | Discharge status: Against Medical Advice | Payer: Self-pay | Source: Home / Self Care | Attending: Internal Medicine

## 2022-07-10 ENCOUNTER — Inpatient Hospital Stay: Payer: PPO

## 2022-07-10 ENCOUNTER — Telehealth: Payer: Self-pay | Admitting: Family Medicine

## 2022-07-10 DIAGNOSIS — I739 Peripheral vascular disease, unspecified: Secondary | ICD-10-CM | POA: Diagnosis not present

## 2022-07-10 DIAGNOSIS — I70245 Atherosclerosis of native arteries of left leg with ulceration of other part of foot: Secondary | ICD-10-CM | POA: Diagnosis not present

## 2022-07-10 DIAGNOSIS — I716 Thoracoabdominal aortic aneurysm, without rupture, unspecified: Secondary | ICD-10-CM

## 2022-07-10 DIAGNOSIS — L97529 Non-pressure chronic ulcer of other part of left foot with unspecified severity: Secondary | ICD-10-CM | POA: Diagnosis not present

## 2022-07-10 DIAGNOSIS — F17200 Nicotine dependence, unspecified, uncomplicated: Secondary | ICD-10-CM | POA: Diagnosis not present

## 2022-07-10 DIAGNOSIS — I70221 Atherosclerosis of native arteries of extremities with rest pain, right leg: Secondary | ICD-10-CM

## 2022-07-10 DIAGNOSIS — L089 Local infection of the skin and subcutaneous tissue, unspecified: Secondary | ICD-10-CM | POA: Diagnosis not present

## 2022-07-10 HISTORY — PX: LOWER EXTREMITY ANGIOGRAPHY: CATH118251

## 2022-07-10 LAB — CBC
HCT: 33.8 % — ABNORMAL LOW (ref 36.0–46.0)
Hemoglobin: 11.2 g/dL — ABNORMAL LOW (ref 12.0–15.0)
MCH: 30.3 pg (ref 26.0–34.0)
MCHC: 33.1 g/dL (ref 30.0–36.0)
MCV: 91.4 fL (ref 80.0–100.0)
Platelets: 204 10*3/uL (ref 150–400)
RBC: 3.7 MIL/uL — ABNORMAL LOW (ref 3.87–5.11)
RDW: 15 % (ref 11.5–15.5)
WBC: 6.9 10*3/uL (ref 4.0–10.5)
nRBC: 0 % (ref 0.0–0.2)

## 2022-07-10 LAB — BASIC METABOLIC PANEL
Anion gap: 12 (ref 5–15)
BUN: 29 mg/dL — ABNORMAL HIGH (ref 8–23)
CO2: 20 mmol/L — ABNORMAL LOW (ref 22–32)
Calcium: 9.1 mg/dL (ref 8.9–10.3)
Chloride: 105 mmol/L (ref 98–111)
Creatinine, Ser: 1.11 mg/dL — ABNORMAL HIGH (ref 0.44–1.00)
GFR, Estimated: 55 mL/min — ABNORMAL LOW (ref >60)
Glucose, Bld: 93 mg/dL (ref 70–99)
Potassium: 3.3 mmol/L — ABNORMAL LOW (ref 3.5–5.1)
Sodium: 137 mmol/L (ref 135–145)

## 2022-07-10 LAB — GLUCOSE, CAPILLARY: Glucose-Capillary: 89 mg/dL (ref 70–99)

## 2022-07-10 LAB — CULTURE, BLOOD (ROUTINE X 2): Culture: NO GROWTH

## 2022-07-10 SURGERY — LOWER EXTREMITY ANGIOGRAPHY
Anesthesia: Moderate Sedation | Laterality: Left

## 2022-07-10 MED ORDER — CEFAZOLIN SODIUM-DEXTROSE 2-4 GM/100ML-% IV SOLN
INTRAVENOUS | Status: AC
  Filled 2022-07-10: qty 100

## 2022-07-10 MED ORDER — IODIXANOL 320 MG/ML IV SOLN
INTRAVENOUS | Status: DC | PRN
  Administered 2022-07-10: 45 mL via INTRA_ARTERIAL

## 2022-07-10 MED ORDER — IOHEXOL 350 MG/ML SOLN
100.0000 mL | Freq: Once | INTRAVENOUS | Status: AC | PRN
  Administered 2022-07-10: 100 mL via INTRAVENOUS

## 2022-07-10 MED ORDER — FENTANYL CITRATE (PF) 100 MCG/2ML IJ SOLN: 12.5000 ug | Freq: Once | INTRAMUSCULAR | Status: DC | PRN

## 2022-07-10 MED ORDER — HYDROMORPHONE HCL 1 MG/ML IJ SOLN: 1.0000 mg | Freq: Once | INTRAMUSCULAR | Status: DC | PRN

## 2022-07-10 MED ORDER — SODIUM CHLORIDE 0.9 % IV SOLN: INTRAVENOUS | Status: DC

## 2022-07-10 MED ORDER — CEFAZOLIN SODIUM-DEXTROSE 2-4 GM/100ML-% IV SOLN
2.0000 g | INTRAVENOUS | Status: AC
  Administered 2022-07-10: 2 g via INTRAVENOUS

## 2022-07-10 MED ORDER — OXYCODONE-ACETAMINOPHEN 5-325 MG PO TABS
2.0000 | ORAL_TABLET | ORAL | Status: DC | PRN
  Administered 2022-07-10: 2 via ORAL
  Filled 2022-07-10: qty 2

## 2022-07-10 MED ORDER — METHIMAZOLE 2.5 MG HALF TABLET: 2.5000 mg | ORAL_TABLET | Freq: Every day | ORAL | Status: DC

## 2022-07-10 MED ORDER — DIPHENHYDRAMINE HCL 50 MG/ML IJ SOLN: 50.0000 mg | Freq: Once | INTRAMUSCULAR | Status: DC | PRN

## 2022-07-10 MED ORDER — METHYLPREDNISOLONE SODIUM SUCC 125 MG IJ SOLR: 125.0000 mg | Freq: Once | INTRAMUSCULAR | Status: DC | PRN

## 2022-07-10 MED ORDER — ONDANSETRON HCL 4 MG/2ML IJ SOLN: 4.0000 mg | Freq: Four times a day (QID) | INTRAMUSCULAR | Status: DC | PRN

## 2022-07-10 MED ORDER — POTASSIUM CHLORIDE CRYS ER 20 MEQ PO TBCR
20.0000 meq | EXTENDED_RELEASE_TABLET | Freq: Once | ORAL | Status: AC
  Administered 2022-07-10: 20 meq via ORAL
  Filled 2022-07-10: qty 1

## 2022-07-10 MED ORDER — FENTANYL CITRATE (PF) 100 MCG/2ML IJ SOLN
INTRAMUSCULAR | Status: AC
  Filled 2022-07-10: qty 2

## 2022-07-10 MED ORDER — FENTANYL CITRATE (PF) 100 MCG/2ML IJ SOLN
INTRAMUSCULAR | Status: DC | PRN
  Administered 2022-07-10: 50 ug via INTRAVENOUS

## 2022-07-10 MED ORDER — MIDAZOLAM HCL 5 MG/5ML IJ SOLN
INTRAMUSCULAR | Status: AC
  Filled 2022-07-10: qty 5

## 2022-07-10 MED ORDER — FAMOTIDINE 20 MG PO TABS: 40.0000 mg | ORAL_TABLET | Freq: Once | ORAL | Status: DC | PRN

## 2022-07-10 MED ORDER — LORAZEPAM 2 MG/ML IJ SOLN: 0.5000 mg | Freq: Once | INTRAMUSCULAR | Status: DC

## 2022-07-10 MED ORDER — MIDAZOLAM HCL 2 MG/2ML IJ SOLN
INTRAMUSCULAR | Status: DC | PRN
  Administered 2022-07-10: 1 mg via INTRAVENOUS

## 2022-07-10 MED ORDER — OXYCODONE-ACETAMINOPHEN 5-325 MG PO TABS
ORAL_TABLET | ORAL | Status: AC
  Filled 2022-07-10: qty 1

## 2022-07-10 MED ORDER — MIDAZOLAM HCL 2 MG/ML PO SYRP: 8.0000 mg | ORAL_SOLUTION | Freq: Once | ORAL | Status: DC | PRN

## 2022-07-10 MED ORDER — HYDRALAZINE HCL 20 MG/ML IJ SOLN
INTRAMUSCULAR | Status: AC
  Filled 2022-07-10: qty 1

## 2022-07-10 MED ORDER — HEPARIN SODIUM (PORCINE) 1000 UNIT/ML IJ SOLN
INTRAMUSCULAR | Status: AC
  Filled 2022-07-10: qty 10

## 2022-07-10 SURGICAL SUPPLY — 8 items
CATH ANGIO 5F PIGTAIL 65CM (CATHETERS) IMPLANT
KIT MICROPUNCTURE NIT STIFF (SHEATH) IMPLANT
PACK ANGIOGRAPHY (CUSTOM PROCEDURE TRAY) ×1 IMPLANT
SHEATH BRITE TIP 5FRX11 (SHEATH) IMPLANT
SYR MEDRAD MARK 7 150ML (SYRINGE) IMPLANT
TUBING CONTRAST HIGH PRESS 72 (TUBING) IMPLANT
WIRE GUIDERIGHT .035X150 (WIRE) IMPLANT
WIRE NITINOL .018 (WIRE) IMPLANT

## 2022-07-10 NOTE — Plan of Care (Signed)

## 2022-07-10 NOTE — Progress Notes (Signed)
PROGRESS NOTE    Jasmine Camacho  ZOX:[id number] DOB: 03-08-1955 DOA: 07/08/2022 PCP: Dorcas Carrow, DO   Assessment & Plan:   Principal Problem:   PVD (peripheral vascular disease) (HCC) Active Problems:   Toe infection   Hypertension   Chronic systolic CHF (congestive heart failure) (HCC)   Myocardial injury   Chronic obstructive pulmonary disease (HCC)   HLD (hyperlipidemia)   Hyperthyroidism   CKD (chronic kidney disease) stage 2, GFR 60-89 ml/min   Tobacco dependence  Assessment and Plan: PVD: s/p b/l LE angiograms which showed known thoracoabdominal aneurysm that appears to have thrombosed. Both iliac arteries were occluded. The right iliac artery is occluded in the external iliac artery and the left iliac artery is occluded close to the iliac bifurcation. No intervention was able to be done. Increased percocet dose. Morphine prn. Will need long term chronic pain meds. Vasc surg following and recs apprec    Toe infection:  vasc surg unable to do any intervention and likely toe infection will not heal w/ limited blood supply so will continue on IV rocephin. No acute surgery needed as per podiatry     HTN: continue on lasix, losartan, metoprolol. IV hydralazine prn    Chronic systolic CHF:  echo on 07/04/2021 showed EF 20%.  Patient has elevated BNP > 4500, but no leg edema or JVD.  No worsening shortness breath.  Chest x-ray showed mild pulmonary edema. Monitor I/Os. Continue on lasix   Hypokalemia: potassium given    Myocardial injury: Troponin level 45 --> 41, no chest pain.   COPD: w/o exacerbation. Bronchodilators prn    HLD: not currently taking a statin   Hyperthyroidism: continue on home dose of methimazole    CKDII: Cr is trending down slightly from day prior    Tobacco dependence: nicotine patch to prevent w/drawal. Received smoking cessation counseling       DVT prophylaxis: lovenox  Code Status: full  Family Communication: discussed pt's care w/ pt's  husband at bedside and answered his questions  Disposition Plan: likely d/c back home   Level of care: Telemetry Cardiac Status is: Inpatient Remains inpatient appropriate because: severity of illness, going for angiogram tomorrow      Consultants:  Vasc surg Podiatry   Procedures:   Antimicrobials: rocephin   Subjective: Pt c/o foot pain   Objective: Vitals:   07/10/22 0035 07/10/22 0407 07/10/22 0500 07/10/22 0731  BP: (!) 144/102 (!) 158/93  (!) 163/94  Pulse: (!) 101 86  78  Resp:  16  16  Temp:  98.2 F (36.8 C)  98.2 F (36.8 C)  TempSrc:      SpO2:  96%  98%  Weight:   54.9 kg   Height:        Intake/Output Summary (Last 24 hours) at 07/10/2022 0758 Last data filed at 07/09/2022 1845 Gross per 24 hour  Intake 480 ml  Output --  Net 480 ml   Filed Weights   07/08/22 0812 07/09/22 0842 07/10/22 0500  Weight: 52.2 kg 51.8 kg 54.9 kg    Examination:  General exam: appears comfortable   Respiratory system: clear breath sounds b/l  Cardiovascular system: S1/S2+. No rubs or gallops   Gastrointestinal system: abd is soft, NT, ND & hypoactive bowel sounds  Central nervous system: alert and oriented.  Psychiatry:  judgement and insight appears at baseline. Flat mood and affect     Data Reviewed: I have personally reviewed following labs and imaging studies  CBC: Recent Labs  Lab 07/08/22 0822 07/09/22 0502 07/10/22 0451  WBC 5.8 6.1 6.9  HGB 11.4* 11.3* 11.2*  HCT 35.3* 33.9* 33.8*  MCV 93.6 92.1 91.4  PLT 210 202 204   Basic Metabolic Panel: Recent Labs  Lab 07/08/22 0822 07/09/22 0502 07/10/22 0451  NA 138 138 137  K 3.6 3.7 3.3*  CL 106 106 105  CO2 24 20* 20*  GLUCOSE 99 92 93  BUN 27* 26* 29*  CREATININE 1.17* 1.18* 1.11*  CALCIUM 9.0 8.9 9.1   GFR: Estimated Creatinine Clearance: 43.1 mL/min (A) (by C-G formula based on SCr of 1.11 mg/dL (H)). Liver Function Tests: No results for input(s): "AST", "ALT", "ALKPHOS", "BILITOT",  "PROT", "ALBUMIN" in the last 168 hours. No results for input(s): "LIPASE", "AMYLASE" in the last 168 hours. No results for input(s): "AMMONIA" in the last 168 hours. Coagulation Profile: Recent Labs  Lab 07/08/22 1913  INR 1.3*   Cardiac Enzymes: No results for input(s): "CKTOTAL", "CKMB", "CKMBINDEX", "TROPONINI" in the last 168 hours. BNP (last 3 results) No results for input(s): "PROBNP" in the last 8760 hours. HbA1C: Recent Labs    07/08/22 1913  HGBA1C 5.4   CBG: Recent Labs  Lab 07/09/22 0737 07/10/22 0752  GLUCAP 82 89   Lipid Profile: Recent Labs    07/08/22 1019  CHOL 111  HDL 37*  LDLCALC 62  TRIG 61  CHOLHDL 3.0   Thyroid Function Tests: No results for input(s): "TSH", "T4TOTAL", "FREET4", "T3FREE", "THYROIDAB" in the last 72 hours. Anemia Panel: No results for input(s): "VITAMINB12", "FOLATE", "FERRITIN", "TIBC", "IRON", "RETICCTPCT" in the last 72 hours. Sepsis Labs: Recent Labs  Lab 07/08/22 0911  LATICACIDVEN 0.9    Recent Results (from the past 240 hour(s))  Culture, blood (routine x 2)     Status: None (Preliminary result)   Collection Time: 07/08/22  9:11 AM   Specimen: BLOOD  Result Value Ref Range Status   Specimen Description BLOOD RIGHT [facility]  Final   Special Requests   Final    BOTTLES DRAWN AEROBIC AND ANAEROBIC Blood Culture adequate volume   Culture   Final    NO GROWTH 2 DAYS Performed at [facility], [address], [place], [place] [address]    Report Status PENDING  Incomplete  Culture, blood (routine x 2)     Status: None (Preliminary result)   Collection Time: 07/08/22  9:11 AM   Specimen: BLOOD  Result Value Ref Range Status   Specimen Description BLOOD LEFT FA  Final   Special Requests   Final    BOTTLES DRAWN AEROBIC AND ANAEROBIC Blood Culture adequate volume   Culture   Final    NO GROWTH 2 DAYS Performed at [facility], [address], [place], [place] [address]    Report Status PENDING   Incomplete  MRSA Next Gen by PCR, Nasal     Status: None   Collection Time: 07/09/22 10:40 AM   Specimen: Nasal Mucosa; Nasal Swab  Result Value Ref Range Status   MRSA by PCR Next Gen NOT DETECTED NOT DETECTED Final    Comment: (NOTE) The GeneXpert MRSA Assay (FDA approved for NASAL specimens only), is one component of a comprehensive MRSA colonization surveillance program. It is not intended to diagnose MRSA infection nor to guide or monitor treatment for MRSA infections. Test performance is not FDA approved in patients less than 45 years old. Performed at [facility], [address], [place], [place] [address]  Radiology Studies: DG Foot Complete Left  Result Date: 07/08/2022 CLINICAL DATA:  Hypertension and black toes. Bilateral foot and toe pain. EXAM: LEFT FOOT - COMPLETE 3+ VIEW; RIGHT FOOT COMPLETE - 3+ VIEW COMPARISON:  None Available. FINDINGS: Right: There is no acute fracture or dislocation. There is mild hallux valgus deformity. Alignment is otherwise normal. Screw fixation of the proximal metaphysis of the first metatarsal is noted without evidence of complication. There is no perihardware lucency. There is no bony erosion. The joint spaces are overall preserved. There is mild Achilles enthesopathy. The soft tissues are unremarkable. There is no soft tissue gas or radiopaque foreign body. Left: There is no acute fracture or dislocation. There is mild hallux valgus deformity. Alignment is otherwise normal. There is no osseous erosion or destruction. The joint spaces are overall preserved. There is no soft tissue gas or radiopaque foreign body. IMPRESSION: 1. No acute finding in either foot. No soft tissue gas or radiopaque foreign body, and no osseous erosion or destruction. 2. Screw fixation of the proximal metaphysis of the first metatarsal on the right without evidence of complication. 3. Mild hallux valgus deformity bilaterally. Electronically Signed   By:  Lesia Hausen M.D.   On: 07/08/2022 09:53   DG Foot Complete Right  Result Date: 07/08/2022 CLINICAL DATA:  Hypertension and black toes. Bilateral foot and toe pain. EXAM: LEFT FOOT - COMPLETE 3+ VIEW; RIGHT FOOT COMPLETE - 3+ VIEW COMPARISON:  None Available. FINDINGS: Right: There is no acute fracture or dislocation. There is mild hallux valgus deformity. Alignment is otherwise normal. Screw fixation of the proximal metaphysis of the first metatarsal is noted without evidence of complication. There is no perihardware lucency. There is no bony erosion. The joint spaces are overall preserved. There is mild Achilles enthesopathy. The soft tissues are unremarkable. There is no soft tissue gas or radiopaque foreign body. Left: There is no acute fracture or dislocation. There is mild hallux valgus deformity. Alignment is otherwise normal. There is no osseous erosion or destruction. The joint spaces are overall preserved. There is no soft tissue gas or radiopaque foreign body. IMPRESSION: 1. No acute finding in either foot. No soft tissue gas or radiopaque foreign body, and no osseous erosion or destruction. 2. Screw fixation of the proximal metaphysis of the first metatarsal on the right without evidence of complication. 3. Mild hallux valgus deformity bilaterally. Electronically Signed   By: Lesia Hausen M.D.   On: 07/08/2022 09:53   DG Chest 2 View  Result Date: 07/08/2022 CLINICAL DATA:  Discoloration of the toes EXAM: CHEST - 2 VIEW COMPARISON:  Chest radiograph dated 11/17/2018 FINDINGS: Normal lung volumes. Bilateral interstitial opacities. No pleural effusion or pneumothorax. Enlarged cardiomediastinal silhouette. No acute osseous abnormality. IMPRESSION: 1. Mild bilateral pulmonary edema. 2. Enlarged cardiomediastinal silhouette. Electronically Signed   By: Agustin Cree M.D.   On: 07/08/2022 08:57        Scheduled Meds:  amLODipine  5 mg Oral Daily   enoxaparin (LOVENOX) injection  40 mg Subcutaneous  Q24H   Eszopiclone  3 mg Oral QHS   furosemide  20 mg Oral Daily   losartan  100 mg Oral Daily   methimazole  5 mg Oral Daily   metoprolol succinate  12.5 mg Oral QHS   mometasone-formoterol  2 puff Inhalation BID   nicotine  21 mg Transdermal Daily   pantoprazole  40 mg Oral Daily   Continuous Infusions:  sodium chloride      ceFAZolin (ANCEF)  IV     cefTRIAXone (ROCEPHIN)  IV Stopped (07/09/22 1701)     LOS: 2 days    Time spent: 35 mins     Charise Killian, MD [facility] Pager [phone]  If 7PM-7AM, please contact night-coverage [email or web address] 07/10/2022, 7:58 AM

## 2022-07-10 NOTE — Telephone Encounter (Signed)
We do not usually prescribe long term pain medicine. This would need to be discussed at a visit.

## 2022-07-10 NOTE — Telephone Encounter (Signed)
Appointment has been made

## 2022-07-10 NOTE — Op Note (Signed)
[facility]  Percutaneous Study/Intervention Procedural Note   Date of Surgery: 07/10/2022  Surgeon(s):Yvaine Jankowiak    Assistants:none  Pre-operative Diagnosis: PAD with ulceration left lower extremity and rest pain bilaterally  Post-operative diagnosis:  Same  Procedure(s) Performed:             1.  Ultrasound guidance for vascular access bilateral femoral arteries             2.  Catheter placement into the external iliac artery on each side from bilateral femoral approaches             3.  Aortogram and selective bilateral lower extremity angiograms              EBL: 10 cc  Contrast: 45 cc  Fluoro Time: 1.4 minutes  Moderate Conscious Sedation Time: approximately 23 minutes using 1 mg of Versed and 50 mcg of Fentanyl              Indications:  Patient is a 67 y.o.female with new ulcerations on the left foot and rest pain bilaterally. The patient has noninvasive study showing heartedly reduced perfusion bilaterally. The patient is brought in for angiography for further evaluation and potential treatment.  Due to the limb threatening nature of the situation, angiogram was performed for attempted limb salvage. The patient is aware that if the procedure fails, amputation would be expected.  The patient also understands that even with successful revascularization, amputation may still be required due to the severity of the situation.  Risks and benefits are discussed and informed consent is obtained.   Procedure:  The patient was identified and appropriate procedural time out was performed.  The patient was then placed supine on the table and prepped and draped in the usual sterile fashion. Moderate conscious sedation was administered during a face to face encounter with the patient throughout the procedure with my supervision of the RN administering medicines and monitoring the patient's vital signs, pulse oximetry, telemetry and mental status throughout from the  start of the procedure until the patient was taken to the recovery room. Ultrasound was used to evaluate the right common femoral artery.  It was patent .  A digital ultrasound image was acquired.  A micropuncture needle was used to access the right common femoral artery under direct ultrasound guidance and a permanent image was performed.  A micropuncture wire and sheath were then placed and I could not traverse beyond the mid right external iliac artery.  Imaging showed the right external iliac artery to be occluded.  There was a large thoracoabdominal aneurysm that appeared to measure approximately 7 cm in diameter.  This appeared to be thrombosed.  Selective right lower extremity angiogram was then performed. This demonstrated disease of the common femoral artery that was at least moderate in nature.  The external iliac artery was occluded.  The SFA and popliteal artery were then continuous without obvious focal stenosis and she appeared to have two-vessel runoff distally although it was somewhat sluggish.  It was clear that we would not be able to do any intervention from this right side today.  I then accessed the left femoral artery to evaluate the left iliac and lower extremity.  A micropuncture needle was used to access the left femoral artery under direct ultrasound guidance.  Permanent image was recorded.  A micropuncture wire and sheath were then placed.  I then imaged the left iliac artery which was occluded at the iliac bifurcation.  I  then elected to go ahead and image the left lower extremity.  This demonstrated mild disease of the common femoral artery but a diffusely diseased SFA with multiple areas of greater than 75% stenosis.  The popliteal artery normalized.  The peroneal artery was the only runoff distally. I elected to terminate the procedure. The sheath was removed from each side and pressure was held at the sites.  This is a very difficult situation.  Will get a CT angiogram to further  evaluate the aorta.  The patient was taken to the recovery room in stable condition having tolerated the procedure well.  Findings:               Aortogram: Large, roughly 7 cm in diameter aortic aneurysm.  The patient had a known thoracoabdominal aneurysm that appears to have thrombosed.  Both iliac arteries were occluded.  The right iliac artery is occluded in the external iliac artery and the left iliac artery is occluded close to the iliac bifurcation.             Right lower Extremity:  This demonstrated disease of the common femoral artery that was at least moderate in nature.  The external iliac artery was occluded.  The SFA and popliteal artery were then continuous without obvious focal stenosis and she appeared to have two-vessel runoff distally although it was somewhat sluggish  Left lower extremity: This demonstrated mild disease of the common femoral artery but a diffusely diseased SFA with multiple areas of greater than 75% stenosis.  The popliteal artery normalized.  The peroneal artery was the only runoff distally.   Disposition: Patient was taken to the recovery room in stable condition having tolerated the procedure well.  Complications: None  Festus Barren 07/10/2022 10:11 AM   This note was created with Dragon Medical transcription system. Any errors in dictation are purely unintentional.

## 2022-07-10 NOTE — Telephone Encounter (Signed)
The spouse of the patient called stating his wife is going to be discharged soon from [facility]. He states hopefully that will be tomorrow, Saturday pending her pain levels and results of her CT scan. He spoke with a Dr Mayford Knife who wanted to make sure his provider was on board with continuing long term pain meds when the ones she gets at discharge run out. The husband says this would be long term and probably the strongest pain meds that can be prescribed as she has been on morphine. Please assist patient further as she has been on the Oxycodone 5/325 but Dr Mayford Knife states maybe her provider would up it to the 10s which she knows is the strongest that can be prescribed without having an IV. She will have the Oxy 10s when she leaves which will be prescribed by Dr Mayford Knife. Please call her spouse if any questions

## 2022-07-10 NOTE — TOC CM/SW Note (Signed)
Transition of Care [facility]) - Inpatient Brief Assessment   Patient Details  Name: Jasmine Camacho MRN: [id number] Date of Birth: 01-10-1956  [facility]) CM/SW Contact:    Margarito Liner, LCSW Phone Number: 07/10/2022, 12:35 PM   Clinical Narrative: CSW reviewed chart. No TOC needs identified at this time. CSW will continue to follow progress. Please consult if any TOC needs arise.  Transition of Care Asessment: Insurance and Status: Insurance coverage has been reviewed Patient has primary care physician: Yes Home environment has been reviewed: Single family home   Prior/Current Home Services: No current home services Social Determinants of Health Reivew: SDOH reviewed no interventions necessary Readmission risk has been reviewed: Yes Transition of care needs: no transition of care needs at this time

## 2022-07-10 NOTE — Progress Notes (Signed)
At 1850, this RN was called to the pt's room.  Pt saying she is leaving now, is going home right now.  Daughter at the bedside, crying, upset, trying to talk her mom into staying.  Pt offered anxiety meds, pain meds.  Reassured by this RN, attempted to de-escalate and explain why patient should not leave AMA, including risk of post op complications, including death.  Pt verbalized understanding and continued to be adamant that she is going home. Secure chat sent to Dr. Mayford Knife who states that pt is A+Ox4 and that she can leave AMA.  Pt's IV removed, pt signed AMA form.  Discussed post op groin care with pt, SO, and daughter.  Reviewed signs of infection and to watch for worsening signs of vascular problems, and to notify MD or return to the ER for worsening S&S.  Per SO, pt has a follow up appt next Wednesday with PCP.  Pt has all belongings, pt's own med was obtained from the pharmacy by Elenor Quinones, RN and returned to pt's husband.  There was 1 tablet left in the bottle and pt's husband in agreement that the count is correct.  Pt put in wheelchair and escorted out by family.  AMA form placed in chart.  Charge nurse aware of situation as well.

## 2022-07-10 NOTE — Interval H&P Note (Signed)
History and Physical Interval Note:  07/10/2022 8:49 AM  Jasmine Camacho  has presented today for surgery, with the diagnosis of PAD.  The various methods of treatment have been discussed with the patient and family. After consideration of risks, benefits and other options for treatment, the patient has consented to  Procedure(s): Lower Extremity Angiography (Left) as a surgical intervention.  The patient's history has been reviewed, patient examined, no change in status, stable for surgery.  I have reviewed the patient's chart and labs.  Questions were answered to the patient's satisfaction.     Festus Barren

## 2022-07-11 LAB — CULTURE, BLOOD (ROUTINE X 2): Special Requests: ADEQUATE

## 2022-07-11 NOTE — Discharge Summary (Signed)
Physician Discharge Summary  Jasmine Camacho WJX:[id number] DOB: 10/24/1955 DOA: 07/08/2022  PCP: Dorcas Carrow, DO  Admit date: 07/08/2022 Discharge date: 07/11/2022  Admitted From: home  Disposition:  Pt left AMA   Recommendations for Outpatient Follow-up:  Pt left AMA   Home Health: no  Equipment/Devices:  Discharge Condition: guarded CODE STATUS: full  Diet recommendation: Regular   Brief/Interim Summary: Jasmine GONALEZ is a 67 y.o. female with medical history significant of PVD, HTN. HLD, COPD, sCHF with EF 20%, CKD-2, tobacco abuse, history of ICH due to brain aneurysm (s/p coiled repair), intermittent aphasia, AAA, PVC, hyperthyroidism/Graves' disease), who presents with foot pain and toe discoloration.   Patient has hx of PVD and claudication. She has been following up with vascular surgeon. Pt is scheduled for procedure angio next week. Pt reports worsening pain and purplish discoloration of toes in both feet.  Patient has purulent discharge from fourth toe in both feet.  Her foot pain is constant, severe, sharp, nonradiating, not aggravated or alleviated by any known factors. Pt was started on Keflex yesterday without improvement. Patient does not have fever or chills.  She has nausea, no vomiting, diarrhea or abdominal pain.  Denies chest pain, shortness of breath or cough.  No symptoms of UTI.   Data reviewed independently and ED Course: pt was found to have WBC 5.8, trop 45 --> 41, lactic acid 0.9, slightly worsening renal function, BNP> 4500, temperature normal, blood pressure 198/127, heart rate 92, 86, RR 18, oxygen saturation 98% on room air.  Chest x-ray showed mild pulmonary edema.  Patient is admitted to telemetry bed as inpatient.  Dr. Wyn Quaker of [facility] and Dr. Lilian Kapur of podiatry is consulted.   X-ray of feet: 1. No acute finding in either foot. No soft tissue gas or radiopaque foreign body, and no osseous erosion or destruction. 2. Screw fixation of the proximal metaphysis  of the first metatarsal on the right without evidence of complication. 3. Mild hallux valgus deformity bilaterally.     EKG: I have personally reviewed.  Sinus rhythm, QTc 528, LAE, LAD, poor IV progression, T wave inversion in lateral leads, ST elevation only in lead III.  The repeated EKG showed no ST elevation.    07/10/22: Pt decided she had to leave AMA and wanted to go home. Message sent to pt's nurse, Debby Freiberg yesterday: Ok. Understandable but if she is AA&Ox4 we can't hold her against her will. I advise against it but she has that right to sign out AMA.    Discharge Diagnoses:  Principal Problem:   PVD (peripheral vascular disease) (HCC) Active Problems:   Toe infection   Hypertension   Chronic systolic CHF (congestive heart failure) (HCC)   Myocardial injury   Chronic obstructive pulmonary disease (HCC)   HLD (hyperlipidemia)   Hyperthyroidism   CKD (chronic kidney disease) stage 2, GFR 60-89 ml/min   Tobacco dependence  PVD: s/p b/l LE angiograms which showed known thoracoabdominal aneurysm that appears to have thrombosed. Both iliac arteries were occluded. The right iliac artery is occluded in the external iliac artery and the left iliac artery is occluded close to the iliac bifurcation. No intervention was able to be done. Increased percocet dose. Morphine prn. Will need long term chronic pain meds. Vasc surg following and recs apprec    Toe infection:  vasc surg unable to do any intervention and likely toe infection will not heal w/ limited blood supply so will continue on IV rocephin. No acute  surgery needed as per podiatry     HTN: continue on lasix, losartan, metoprolol. IV hydralazine prn    Chronic systolic CHF:  echo on 07/04/2021 showed EF 20%.  Patient has elevated BNP > 4500, but no leg edema or JVD.  No worsening shortness breath.  Chest x-ray showed mild pulmonary edema. Monitor I/Os. Continue on lasix    Hypokalemia: potassium given    Myocardial  injury: Troponin level 45 --> 41, no chest pain.   COPD: w/o exacerbation. Bronchodilators prn    HLD: not currently taking a statin    Hyperthyroidism: continue on home dose of methimazole    CKDII: Cr is trending down slightly from day prior    Tobacco dependence: nicotine patch to prevent w/drawal. Received smoking cessation counseling   Discharge Instructions      Allergies  Allergen Reactions   Ambien [Zolpidem]    Asa [Aspirin]    Benazepril Hcl    Carvedilol Other (See Comments)    GI upset   Codeine Sulfate Nausea Only   Metoprolol Tartrate    Other Other (See Comments)    Valtura   Aldactone [Spironolactone] Other (See Comments)    Abdominal pain    Hctz [Hydrochlorothiazide] Rash    Consultations: Vasc surg  Podiatry    Procedures/Studies: CT Angio Abd/Pel w/ and/or w/o  Result Date: 07/10/2022 CLINICAL DATA:  67 year old female history aneurysm EXAM: CT ANGIOGRAPHY ABDOMEN AND PELVIS WITH CONTRAST AND WITHOUT CONTRAST TECHNIQUE: Multidetector CT imaging of the abdomen and pelvis was performed using the standard protocol during bolus administration of intravenous contrast. Multiplanar reconstructed images and MIPs were obtained and reviewed to evaluate the vascular anatomy. RADIATION DOSE REDUCTION: This exam was performed according to the departmental dose-optimization program which includes automated exposure control, adjustment of the mA and/or kV according to patient size and/or use of iterative reconstruction technique. CONTRAST:  OMNIPAQUE IOHEXOL 350 MG/ML SOLN COMPARISON:  None Available. FINDINGS: VASCULAR Aorta: Similar appearing diffuse ectasia the abdominal measuring up to approximately 45 mm in greatest short axis dimension at the level the diaphragmatic hiatus. Scattered fibrofatty and calcific atherosclerotic changes. Again seen is a juxtarenal abdominal aortic aneurysm which measures up to 73 mm in greatest short axis dimension, enlarged from  62 mm on comparison by my measurements. Celiac: Occlusion of the proximal 1 cm celiac trunk distal reconstitution. SMA: Mild ostial stenosis secondary to atherosclerotic plaque, patent distally. Renals: Severe long segment stenosis in affective total occlusion of the left renal artery. Moderate ostial stenosis of the main right renal artery. There is an accessory upper pole right renal artery arising just proximal to the main right renal artery from the abdominal aorta. IMA: Occluded due to origination from thrombosed portion of abdominal aortic aneurysm, though there is distal reconstitution via arc of Riolan. Inflow: Severe fibrofatty and calcific atherosclerotic changes to the common iliac arteries bilaterally with associated fusiform aneurysmal changes of the right common iliac artery measuring up to 18 mm. Just proximal to the bifurcation is an approximately 70% focal stenosis the proximal internal iliac artery is patent with heavy atherosclerotic burden. Severe stenosis versus focal occlusion of the proximal right external iliac artery with diminutive distal reconstitution. Fusiform aneurysmal changes of the left common iliac artery measuring up to 17 mm. The proximal internal iliac artery is patent. In near symmetric fashion, there is focal occlusion versus severe stenosis of the mid left external iliac artery which is patent but diminutive distally with coarse calcifications. Proximal Outflow: Patent but diminutive  with scattered coarse atherosclerotic calcifications. Veins: The hepatic veins are widely patent. The portal system is widely patent and normal in caliber. The renal veins are patent bilaterally in standard anatomic configuration. No evidence of iliocaval thrombosis or anomaly. Review of the MIP images confirms the above findings. NON-VASCULAR Lower chest: No acute abnormality. Hepatobiliary: No focal liver abnormality is seen. No gallstones, gallbladder wall thickening, or biliary dilatation.  Pancreas: Unremarkable. No pancreatic ductal dilatation or surrounding inflammatory changes. Spleen: Normal in size without focal abnormality. Adrenals/Urinary Tract: Adrenal glands are unremarkable. Diffuse left renal atrophy with delayed opacification. No evidence of renal mass, nephrolithiasis, or hydronephrosis. The bladder is present and moderately distended. Stomach/Bowel: Stomach is within normal limits. Appendix is not definitively identified. No evidence of bowel wall thickening, distention, or inflammatory changes. Lymphatic: No abdominopelvic lymphadenopathy. Reproductive: Uterus and bilateral adnexa are unremarkable. Other: No abdominal wall hernia or abnormality. No abdominopelvic ascites. Musculoskeletal: No acute or significant osseous findings. IMPRESSION: VASCULAR 1. Interval enlargement of previously visualized fusiform juxtarenal abdominal aortic aneurysm measuring up to 7.3 cm, previously 6.2 cm. The patient is currently under the care of Dr. Wyn Quaker, Vascular Surgery. This recommendation follows ACR consensus guidelines: White Paper of the ACR Incidental Findings Committee II on Vascular Findings. J Am Coll Radiol 2013; 10:789-794. 2. Severe aortoiliac atherosclerosis (ICD10-I70.0) with associated fusiform aneurysmal changes of the bilateral common iliac arteries and severe focal stenosis of the bilateral external iliac arteries. The access vessels are diminutive bilaterally with coarse scattered atherosclerotic calcifications. 3. Occlusion of the left renal artery with associated left renal atrophy. 4. Occlusion of the celiac ostium with retrograde reconstitution via gastroduodenal artery collateralization. 5. Occlusion of the inferior mesenteric artery ostium with distal reconstitution via arc of Riolan collateralization. NON-VASCULAR No acute abdominopelvic abnormality. Marliss Coots, MD [facility] Radiology Electronically Signed   By: Marliss Coots M.D.   On: 07/10/2022 13:26   PERIPHERAL VASCULAR CATHETERIZATION  Result Date: 07/10/2022 See surgical note for result.  DG Foot Complete Left  Result Date: 07/08/2022 CLINICAL DATA:  Hypertension and black toes. Bilateral foot and toe pain. EXAM: LEFT FOOT - COMPLETE 3+ VIEW; RIGHT FOOT COMPLETE - 3+ VIEW COMPARISON:  None Available. FINDINGS: Right: There is no acute fracture or dislocation. There is mild hallux valgus deformity. Alignment is otherwise normal. Screw fixation of the proximal metaphysis of the first metatarsal is noted without evidence of complication. There is no perihardware lucency. There is no bony erosion. The joint spaces are overall preserved. There is mild Achilles enthesopathy. The soft tissues are unremarkable. There is no soft tissue gas or radiopaque foreign body. Left: There is no acute fracture or dislocation. There is mild hallux valgus deformity. Alignment is otherwise normal. There is no osseous erosion or destruction. The joint spaces are overall preserved. There is no soft tissue gas or radiopaque foreign body. IMPRESSION: 1. No acute finding in either foot. No soft tissue gas or radiopaque foreign body, and no osseous erosion or destruction. 2. Screw fixation of the proximal metaphysis of the first metatarsal on the right without evidence of complication. 3. Mild hallux valgus deformity bilaterally. Electronically Signed   By: Lesia Hausen M.D.   On: 07/08/2022 09:53   DG Foot Complete Right  Result Date: 07/08/2022 CLINICAL DATA:  Hypertension and black toes. Bilateral foot and toe pain. EXAM: LEFT FOOT - COMPLETE 3+ VIEW; RIGHT FOOT COMPLETE - 3+ VIEW COMPARISON:  None Available. FINDINGS: Right: There is no acute fracture or  dislocation. There is mild hallux valgus deformity. Alignment is otherwise normal. Screw fixation of the proximal metaphysis of the first metatarsal is noted without evidence of complication. There is no perihardware lucency. There is  no bony erosion. The joint spaces are overall preserved. There is mild Achilles enthesopathy. The soft tissues are unremarkable. There is no soft tissue gas or radiopaque foreign body. Left: There is no acute fracture or dislocation. There is mild hallux valgus deformity. Alignment is otherwise normal. There is no osseous erosion or destruction. The joint spaces are overall preserved. There is no soft tissue gas or radiopaque foreign body. IMPRESSION: 1. No acute finding in either foot. No soft tissue gas or radiopaque foreign body, and no osseous erosion or destruction. 2. Screw fixation of the proximal metaphysis of the first metatarsal on the right without evidence of complication. 3. Mild hallux valgus deformity bilaterally. Electronically Signed   By: Lesia Hausen M.D.   On: 07/08/2022 09:53   DG Chest 2 View  Result Date: 07/08/2022 CLINICAL DATA:  Discoloration of the toes EXAM: CHEST - 2 VIEW COMPARISON:  Chest radiograph dated 11/17/2018 FINDINGS: Normal lung volumes. Bilateral interstitial opacities. No pleural effusion or pneumothorax. Enlarged cardiomediastinal silhouette. No acute osseous abnormality. IMPRESSION: 1. Mild bilateral pulmonary edema. 2. Enlarged cardiomediastinal silhouette. Electronically Signed   By: Agustin Cree M.D.   On: 07/08/2022 08:57   VAS [place] ABI WITH/WO TBI  Result Date: 06/26/2022  LOWER EXTREMITY DOPPLER STUDY Patient Name:  MARALYN FASEL  Date of Exam:   06/24/2022 Medical Rec #: [id number]      Accession #:    [id number] Date of Birth: 1955/09/14      Patient Gender: F Patient Age:   89 years Exam Location:  [facility] Procedure:      VAS [place] ABI WITH/WO TBI Referring Phys: Sheppard Plumber --------------------------------------------------------------------------------  Indications: Claudication, rest pain, and peripheral artery disease. Known              aneurysms High Risk Factors: Hypertension, current smoker.  Performing Technologist: Hardie Lora RVT   Examination Guidelines: A complete evaluation includes at minimum, Doppler waveform signals and systolic blood pressure reading at the level of bilateral brachial, anterior tibial, and posterior tibial arteries, when vessel segments are accessible. Bilateral testing is considered an integral part of a complete examination. Photoelectric Plethysmograph (PPG) waveforms and toe systolic pressure readings are included as required and additional duplex testing as needed. Limited examinations for reoccurring indications may be performed as noted.  ABI Findings: +---------+------------------+-----+----------+--------+ Right    Rt Pressure (mmHg)IndexWaveform  Comment  +---------+------------------+-----+----------+--------+ Brachial 176                                       +---------+------------------+-----+----------+--------+ PTA      112               0.64 monophasic         +---------+------------------+-----+----------+--------+ DP       78                0.44 monophasic         +---------+------------------+-----+----------+--------+ Great Toe24                0.14                    +---------+------------------+-----+----------+--------+ +---------+------------------+-----+----------+-------+ Left     Lt Pressure (  mmHg)IndexWaveform  Comment +---------+------------------+-----+----------+-------+ Brachial 157                                      +---------+------------------+-----+----------+-------+ PTA      139               0.79 monophasic        +---------+------------------+-----+----------+-------+ DP       114               0.65 monophasic        +---------+------------------+-----+----------+-------+ Great Toe0                 0.00                   +---------+------------------+-----+----------+-------+ +-------+-----------+-----------+------------+------------+ ABI/TBIToday's ABIToday's TBIPrevious ABIPrevious TBI  +-------+-----------+-----------+------------+------------+ Right  0.64       0.14                                +-------+-----------+-----------+------------+------------+ Left   0.79       0.00                                +-------+-----------+-----------+------------+------------+  Summary: Right: Resting right ankle-brachial index indicates moderate right lower extremity arterial disease. The right toe-brachial index is abnormal. Left: Resting left ankle-brachial index indicates moderate left lower extremity arterial disease. The left toe-brachial index is abnormal. *See table(s) above for measurements and observations.  Electronically signed by Levora Dredge MD on 06/26/2022 at 8:32:57 AM.    Final    (Echo, Carotid, EGD, Colonoscopy, ERCP)    Subjective: Pt left AMA    Discharge Exam: Vitals:   07/10/22 1140 07/10/22 1615  BP: (!) 166/108 123/75  Pulse: 82 82  Resp: 16 16  Temp: (!) 97.3 F (36.3 C) (!) 97.5 F (36.4 C)  SpO2: 99% 99%   Vitals:   07/10/22 1015 07/10/22 1030 07/10/22 1140 07/10/22 1615  BP: (!) 170/90 (!) 192/92 (!) 166/108 123/75  Pulse: 87 84 82 82  Resp: 16 15 16 16   Temp:   (!) 97.3 F (36.3 C) (!) 97.5 F (36.4 C)  TempSrc:      SpO2: 99% 99% 99% 99%  Weight:      Height:        Pt left AMA. Please see note from 07/10/22 for PE    The results of significant diagnostics from this hospitalization (including imaging, microbiology, ancillary and laboratory) are listed below for reference.     Microbiology: Recent Results (from the past 240 hour(s))  Culture, blood (routine x 2)     Status: None (Preliminary result)   Collection Time: 07/08/22  9:11 AM   Specimen: BLOOD  Result Value Ref Range Status   Specimen Description BLOOD RIGHT [facility]  Final   Special Requests   Final    BOTTLES DRAWN AEROBIC AND ANAEROBIC Blood Culture adequate volume   Culture   Final    NO GROWTH 2 DAYS Performed at [facility], [address] [place], [place] [address]    Report Status PENDING  Incomplete  Culture, blood (routine x 2)     Status: None (Preliminary result)   Collection Time: 07/08/22  9:11 AM   Specimen: BLOOD  Result Value Ref Range Status  Specimen Description BLOOD LEFT FA  Final   Special Requests   Final    BOTTLES DRAWN AEROBIC AND ANAEROBIC Blood Culture adequate volume   Culture   Final    NO GROWTH 2 DAYS Performed at [facility], [address] [place], [place] [address]    Report Status PENDING  Incomplete  MRSA Next Gen by PCR, Nasal     Status: None   Collection Time: 07/09/22 10:40 AM   Specimen: Nasal Mucosa; Nasal Swab  Result Value Ref Range Status   MRSA by PCR Next Gen NOT DETECTED NOT DETECTED Final    Comment: (NOTE) The GeneXpert MRSA Assay (FDA approved for NASAL specimens only), is one component of a comprehensive MRSA colonization surveillance program. It is not intended to diagnose MRSA infection nor to guide or monitor treatment for MRSA infections. Test performance is not FDA approved in patients less than 28 years old. Performed at [facility], [address] [place], [place] [address]      Labs: BNP (last 3 results) Recent Labs    07/08/22 0822  BNP >4,500.0*   Basic Metabolic Panel: Recent Labs  Lab 07/08/22 0822 07/09/22 0502 07/10/22 0451  NA 138 138 137  K 3.6 3.7 3.3*  CL 106 106 105  CO2 24 20* 20*  GLUCOSE 99 92 93  BUN 27* 26* 29*  CREATININE 1.17* 1.18* 1.11*  CALCIUM 9.0 8.9 9.1   Liver Function Tests: No results for input(s): "AST", "ALT", "ALKPHOS", "BILITOT", "PROT", "ALBUMIN" in the last 168 hours. No results for input(s): "LIPASE", "AMYLASE" in the last 168 hours. No results for input(s): "AMMONIA" in the last 168 hours. CBC: Recent Labs  Lab 07/08/22 0822 07/09/22 0502 07/10/22 0451  WBC 5.8 6.1 6.9  HGB 11.4* 11.3* 11.2*  HCT 35.3* 33.9* 33.8*  MCV 93.6 92.1 91.4  PLT 210 202 204   Cardiac  Enzymes: No results for input(s): "CKTOTAL", "CKMB", "CKMBINDEX", "TROPONINI" in the last 168 hours. BNP: Invalid input(s): "POCBNP" CBG: Recent Labs  Lab 07/09/22 0737 07/10/22 0752  GLUCAP 82 89   D-Dimer No results for input(s): "DDIMER" in the last 72 hours. Hgb A1c Recent Labs    07/08/22 1913  HGBA1C 5.4   Lipid Profile Recent Labs    07/08/22 1019  CHOL 111  HDL 37*  LDLCALC 62  TRIG 61  CHOLHDL 3.0   Thyroid function studies No results for input(s): "TSH", "T4TOTAL", "T3FREE", "THYROIDAB" in the last 72 hours.  Invalid input(s): "FREET3" Anemia work up No results for input(s): "VITAMINB12", "FOLATE", "FERRITIN", "TIBC", "IRON", "RETICCTPCT" in the last 72 hours. Urinalysis    Component Value Date/Time   COLORURINE YELLOW (A) 11/17/2018 1511   APPEARANCEUR Clear 11/28/2018 1031   LABSPEC >1.046 (H) 11/17/2018 1511   PHURINE 5.0 11/17/2018 1511   GLUCOSEU Negative 11/28/2018 1031   HGBUR NEGATIVE 11/17/2018 1511   BILIRUBINUR Negative 11/28/2018 1031   KETONESUR NEGATIVE 11/17/2018 1511   PROTEINUR Negative 11/28/2018 1031   PROTEINUR 30 (A) 11/17/2018 1511   NITRITE Negative 11/28/2018 1031   NITRITE NEGATIVE 11/17/2018 1511   LEUKOCYTESUR Negative 11/28/2018 1031   LEUKOCYTESUR NEGATIVE 11/17/2018 1511   Sepsis Labs Recent Labs  Lab 07/08/22 0822 07/09/22 0502 07/10/22 0451  WBC 5.8 6.1 6.9   Microbiology Recent Results (from the past 240 hour(s))  Culture, blood (routine x 2)     Status: None (Preliminary result)   Collection Time: 07/08/22  9:11 AM   Specimen: BLOOD  Result  Value Ref Range Status   Specimen Description BLOOD RIGHT [facility]  Final   Special Requests   Final    BOTTLES DRAWN AEROBIC AND ANAEROBIC Blood Culture adequate volume   Culture   Final    NO GROWTH 2 DAYS Performed at [facility], [address], [place], [place] [address]    Report Status PENDING  Incomplete  Culture, blood (routine x 2)     Status: None  (Preliminary result)   Collection Time: 07/08/22  9:11 AM   Specimen: BLOOD  Result Value Ref Range Status   Specimen Description BLOOD LEFT FA  Final   Special Requests   Final    BOTTLES DRAWN AEROBIC AND ANAEROBIC Blood Culture adequate volume   Culture   Final    NO GROWTH 2 DAYS Performed at [facility], [address], [place], [place] [address]    Report Status PENDING  Incomplete  MRSA Next Gen by PCR, Nasal     Status: None   Collection Time: 07/09/22 10:40 AM   Specimen: Nasal Mucosa; Nasal Swab  Result Value Ref Range Status   MRSA by PCR Next Gen NOT DETECTED NOT DETECTED Final    Comment: (NOTE) The GeneXpert MRSA Assay (FDA approved for NASAL specimens only), is one component of a comprehensive MRSA colonization surveillance program. It is not intended to diagnose MRSA infection nor to guide or monitor treatment for MRSA infections. Test performance is not FDA approved in patients less than 76 years old. Performed at [facility], [address] [place], [place] [address]      Time coordinating discharge: Over 30 minutes  SIGNED:   Charise Killian, MD  [facility] 07/11/2022, 7:44 AM Pager   If 7PM-7AM, please contact night-coverage [email or web address]

## 2022-07-12 LAB — CULTURE, BLOOD (ROUTINE X 2)

## 2022-07-13 ENCOUNTER — Encounter: Payer: Self-pay | Admitting: Vascular Surgery

## 2022-07-13 LAB — CULTURE, BLOOD (ROUTINE X 2)
Culture: NO GROWTH
Special Requests: ADEQUATE

## 2022-07-14 ENCOUNTER — Ambulatory Visit: Admission: RE | Admit: 2022-07-14 | Payer: PPO | Source: Home / Self Care | Admitting: Vascular Surgery

## 2022-07-14 ENCOUNTER — Encounter: Admission: RE | Payer: Self-pay | Source: Home / Self Care

## 2022-07-14 DIAGNOSIS — L97909 Non-pressure chronic ulcer of unspecified part of unspecified lower leg with unspecified severity: Secondary | ICD-10-CM

## 2022-07-14 SURGERY — LOWER EXTREMITY ANGIOGRAPHY
Anesthesia: Moderate Sedation | Site: Leg Lower | Laterality: Right

## 2022-07-15 ENCOUNTER — Ambulatory Visit: Payer: PPO | Admitting: Family Medicine

## 2022-07-15 ENCOUNTER — Telehealth: Payer: Self-pay | Admitting: *Deleted

## 2022-07-15 NOTE — Transitions of Care (Post Inpatient/ED Visit) (Signed)
07/15/2022  Name: Jasmine Camacho MRN: [id number] DOB: 01-13-1956  Today's TOC FU Call Status: Today's TOC FU Call Status:: Successful TOC FU Call Competed TOC FU Call Complete Date: 07/15/22  Transition Care Management Follow-up Telephone Call Date of Discharge: 07/10/22 (Left AMA) Discharge Facility: [facility]) Type of Discharge: Inpatient Admission Primary Inpatient Discharge Diagnosis:: foot pain How have you been since you were released from the hospital?: Better (has not needed pain medication since discharged) Any questions or concerns?: Yes Patient Questions/Concerns:: Question regarding long term painmanagement if needed. RN discussed that PCP may refer patient to a pain clinic if meds are long term and need to be monitored closely Patient Questions/Concerns Addressed: Other:  Items Reviewed: Did you receive and understand the discharge instructions provided?: No (No d/cd instructions provided AMA) Medications obtained,verified, and reconciled?: Yes (Medications Reviewed) Any new allergies since your discharge?: No Dietary orders reviewed?: No Do you have support at home?: Yes People in Home: spouse Name of Support/Comfort Primary Source: [name]  Medications Reviewed Today: Medications Reviewed Today     Reviewed by Luella Cook, RN (Case Manager) on 07/15/22 at 1515  Med List Status: <None>   Medication Order Taking? Sig Documenting Provider Last Dose Status Informant  acetaminophen (TYLENOL) 650 MG CR tablet [id number] Yes Take 650 mg by mouth every 8 (eight) hours as needed (pain or fever). [provider] Taking Active Self, Spouse/Significant Other  albuterol (VENTOLIN HFA) 108 (90 Base) MCG/ACT inhaler [id number] Yes INL 2 PFS PO Q 4 TO 6 H PRN Johnson, Megan P, DO Taking Active Self, Spouse/Significant Other  cephALEXin (KEFLEX) 500 MG capsule [id number] Yes Take 1 capsule (500 mg total) by mouth 4 (four) times daily.  Larae Grooms, NP Taking Active Self, Spouse/Significant Other  Eszopiclone 3 MG TABS [id number] Yes Take 1 tablet (3 mg total) by mouth at bedtime. TAKE 1 TABLET BY MOUTH IMMEDIATELY BEFORE BEDTIME. MUST LAST 30 DAYS Johnson, Megan P, DO Taking Active Self, Spouse/Significant Other  fluticasone-salmeterol (ADVAIR) 100-50 MCG/ACT AEPB [id number] Yes Inhale 1 puff into the lungs 2 (two) times daily. Olevia Perches P, DO Taking Active Self, Spouse/Significant Other  furosemide (LASIX) 20 MG tablet [id number] Yes TAKE 2 TABLETS(40 MG) BY MOUTH DAILY Johnson, Megan P, DO Taking Active Self, Spouse/Significant Other  HYDROcodone-acetaminophen (NORCO/VICODIN) 5-325 MG tablet [id number] Yes Take 1 tablet by mouth every 8 (eight) hours. Larae Grooms, NP Taking Active Self, Spouse/Significant Other  losartan (COZAAR) 100 MG tablet [id number] Yes Take 100 mg by mouth daily. [provider] Taking Active Self, Spouse/Significant Other  methimazole (TAPAZOLE) 5 MG tablet [id number] Yes Take 5 mg by mouth daily.  [provider] Taking Active Self, Spouse/Significant Other           Med Note Ailene Ravel, CRISTINA   Wed Feb 05, 2021 10:57 AM) 2.5mg  tab   metoprolol succinate (TOPROL-XL) 25 MG 24 hr tablet [id number] Yes Take 0.5 tablets (12.5 mg total) by mouth at bedtime. Dorcas Carrow, DO Taking Active Self, Spouse/Significant Other  montelukast (SINGULAIR) 10 MG tablet [id number] Yes Take 1 tablet (10 mg total) by mouth at bedtime as needed (for allergies and stuffiness). TAKE 1 TABLET(10 MG) BY MOUTH AT BEDTIME Laural Benes, Megan P, DO Taking Active Self, Spouse/Significant Other  omeprazole (PRILOSEC) 20 MG capsule [id number] Yes TAKE 1 CAPSULE(20 MG) BY MOUTH DAILY Johnson, Megan P, DO Taking Active Self, Spouse/Significant Other  ondansetron (ZOFRAN) 4 MG tablet [id number]  Take 4 mg by mouth every 8 (  eight) hours as needed.  Patient not taking: Reported on 07/08/2022   [provider]   Active Self, Spouse/Significant Other  pravastatin (PRAVACHOL) 20 MG tablet [id number]  Take 1 tablet (20 mg total) by mouth every Monday, Wednesday, and Friday. Dorcas Carrow, DO  Active Self, Spouse/Significant Other           Med Note Aundria Rud, Darlina Sicilian Jul 08, 2022 11:16 AM) Pt has but not taking due to too many meds            Home Care and Equipment/Supplies: Were Home Health Services Ordered?: No Any new equipment or medical supplies ordered?: No  Functional Questionnaire: Do you need assistance with bathing/showering or dressing?: Yes Do you need assistance with meal preparation?: Yes Do you need assistance with eating?: No Do you have difficulty maintaining continence: No Do you need assistance with getting out of bed/getting out of a chair/moving?: No Do you have difficulty managing or taking your medications?: No  Follow up appointments reviewed: PCP Follow-up appointment confirmed?: Yes Date of PCP follow-up appointment?: 07/28/22 Follow-up Provider: [facility] Follow-up appointment confirmed?: Yes Date of Specialist follow-up appointment?: 07/16/22 Follow-Up Specialty Provider:: Dr Gilda Crease, [id number] Pelvic angiography Do you need transportation to your follow-up appointment?: No Do you understand care options if your condition(s) worsen?: Yes-patient verbalized understanding  SDOH Interventions Today    Flowsheet Row Most Recent Value  SDOH Interventions   Food Insecurity Interventions Intervention Not Indicated  Housing Interventions Intervention Not Indicated  Transportation Interventions Intervention Not Indicated      Interventions Today    Flowsheet Row Most Recent Value  General Interventions   General Interventions Discussed/Reviewed General Interventions Discussed, General Interventions Reviewed, Community Resources  [RN discussed HTA Custodial Care services]  Pharmacy Interventions   Pharmacy Dicussed/Reviewed  Pharmacy Topics Discussed, Pharmacy Topics Reviewed      TOC Interventions Today    Flowsheet Row Most Recent Value  TOC Interventions   TOC Interventions Discussed/Reviewed TOC Interventions Discussed, TOC Interventions Reviewed       Gean Maidens BSN RN [facility] [phone]

## 2022-07-16 ENCOUNTER — Ambulatory Visit (INDEPENDENT_AMBULATORY_CARE_PROVIDER_SITE_OTHER): Payer: PPO | Admitting: Vascular Surgery

## 2022-07-17 ENCOUNTER — Other Ambulatory Visit: Payer: Self-pay | Admitting: Family Medicine

## 2022-07-17 NOTE — Telephone Encounter (Signed)
Spoke with the patient's spouse and to make sure she was going to do the procedure with Dr. Gilda Crease on 07/21/22. And per the spouse they will be at the [facility] at 6:45 am.

## 2022-07-17 NOTE — Telephone Encounter (Signed)
Requested Prescriptions  Pending Prescriptions Disp Refills   omeprazole (PRILOSEC) 20 MG capsule [Pharmacy Med Name: OMEPRAZOLE 20MG  CAPSULES] 90 capsule 3    Sig: TAKE 1 CAPSULE(20 MG) BY MOUTH DAILY     Gastroenterology: Proton Pump Inhibitors Passed - 07/17/2022  7:03 AM      Passed - Valid encounter within last 12 months    Recent Outpatient Visits           1 week ago Decreased pulses in feet   [place] [facility] Larae Grooms, NP   3 weeks ago Decreased pulses in feet   [place] [facility] [place], Megan P, DO   2 months ago Primary insomnia   [place] [facility] Holliday, [place], DO   5 months ago Primary insomnia   [place] [facility] Braymer, [place], DO   7 months ago Primary hypertension   [place] [facility] Lipscomb, [place], DO       Future Appointments             In 1 week Laural Benes, Oralia Rud, DO [facility]

## 2022-07-20 ENCOUNTER — Telehealth (INDEPENDENT_AMBULATORY_CARE_PROVIDER_SITE_OTHER): Payer: Self-pay

## 2022-07-20 NOTE — Telephone Encounter (Signed)
I spoke with the spouse to let him know that per Dr. Gilda Crease the patient's angio that is scheduled for 07/21/22 will be canceled and the patient does need to keep her  appt with Dr. Gilda Crease on 07/23/22 at 11:30 am. Patient's spouse stated something needed to be done about the patient's leg. I advised that it will be discussed and the appt.

## 2022-07-21 ENCOUNTER — Ambulatory Visit: Admission: RE | Admit: 2022-07-21 | Payer: PPO | Source: Home / Self Care | Admitting: Vascular Surgery

## 2022-07-21 ENCOUNTER — Encounter: Admission: RE | Payer: Self-pay | Source: Home / Self Care

## 2022-07-21 DIAGNOSIS — L97909 Non-pressure chronic ulcer of unspecified part of unspecified lower leg with unspecified severity: Secondary | ICD-10-CM

## 2022-07-21 SURGERY — PELVIC ANGIOGRAPHY
Anesthesia: Moderate Sedation | Laterality: Bilateral

## 2022-07-23 ENCOUNTER — Ambulatory Visit (INDEPENDENT_AMBULATORY_CARE_PROVIDER_SITE_OTHER): Payer: PPO | Admitting: Vascular Surgery

## 2022-07-23 ENCOUNTER — Encounter (INDEPENDENT_AMBULATORY_CARE_PROVIDER_SITE_OTHER): Payer: Self-pay | Admitting: Vascular Surgery

## 2022-07-23 VITALS — BP 156/104 | HR 68 | Resp 15

## 2022-07-23 DIAGNOSIS — E785 Hyperlipidemia, unspecified: Secondary | ICD-10-CM | POA: Diagnosis not present

## 2022-07-23 DIAGNOSIS — I10 Essential (primary) hypertension: Secondary | ICD-10-CM

## 2022-07-23 DIAGNOSIS — I714 Abdominal aortic aneurysm, without rupture, unspecified: Secondary | ICD-10-CM | POA: Diagnosis not present

## 2022-07-23 DIAGNOSIS — I70223 Atherosclerosis of native arteries of extremities with rest pain, bilateral legs: Secondary | ICD-10-CM

## 2022-07-23 DIAGNOSIS — I716 Thoracoabdominal aortic aneurysm, without rupture, unspecified: Secondary | ICD-10-CM

## 2022-07-23 NOTE — Progress Notes (Signed)
MRN : [id number]  Jasmine Camacho is a 67 y.o. (Oct 09, 1955) female who presents with chief complaint of check circulation.  History of Present Illness:     No outpatient medications have been marked as taking for the 07/23/22 encounter (Appointment) with Gilda Crease, Latina Craver, MD.    Past Medical History:  Diagnosis Date   Acute respiratory failure (HCC) 11/17/2018   Aphasia    Hypertension    Insomnia    Menopausal symptom    Menorrhagia    Pneumonia    Seasonal allergies    Tachycardia    Thyroid disease    Ventricular premature contractions     Past Surgical History:  Procedure Laterality Date   aneurysm surgery  09/2003   DILATION AND CURETTAGE OF UTERUS  2007   LOWER EXTREMITY ANGIOGRAPHY Left 07/10/2022   Procedure: Lower Extremity Angiography;  Surgeon: Annice Needy, MD;  Location: [facility];  Service: Cardiovascular;  Laterality: Left;    Social History Social History   Tobacco Use   Smoking status: Every Day    Packs/day: .25    Types: Cigarettes   Smokeless tobacco: Never  Vaping Use   Vaping Use: Never used  Substance Use Topics   Alcohol use: No    Alcohol/week: 0.0 standard drinks of alcohol   Drug use: No    Family History Family History  Problem Relation Age of Onset   Stroke Mother    Diabetes Mother    Heart disease Mother    Hypertension Mother    Alcohol abuse Father    Cancer Father        liver   Diabetes Brother    Hypertension Brother    Heart disease Brother    Cancer Brother        colon    Allergies  Allergen Reactions   Ambien [Zolpidem]    Asa [Aspirin]    Benazepril Hcl    Carvedilol Other (See Comments)    GI upset   Codeine Sulfate Nausea Only   Metoprolol Tartrate    Other Other (See Comments)    Valtura   Aldactone [Spironolactone] Other (See Comments)    Abdominal pain    Hctz [Hydrochlorothiazide] Rash     REVIEW OF SYSTEMS  (Negative unless checked)  Constitutional: [] Weight loss  [] Fever  [] Chills Cardiac: [] Chest pain   [] Chest pressure   [] Palpitations   [] Shortness of breath when laying flat   [] Shortness of breath with exertion. Vascular:  [x] Pain in legs with walking   [] Pain in legs at rest  [] History of DVT   [] Phlebitis   [] Swelling in legs   [] Varicose veins   [] Non-healing ulcers Pulmonary:   [] Uses home oxygen   [] Productive cough   [] Hemoptysis   [] Wheeze  [x] COPD   [] Asthma Neurologic:  [] Dizziness   [] Seizures   [] History of stroke   [] History of TIA  [] Aphasia   [] Vissual changes   [] Weakness or numbness in arm   [] Weakness or numbness in leg Musculoskeletal:   [] Joint swelling   [] Joint pain   [] Low back pain Hematologic:  []   Easy bruising  [] Easy bleeding   [] Hypercoagulable state   [] Anemic Gastrointestinal:  [] Diarrhea   [] Vomiting  [x] Gastroesophageal reflux/heartburn   [] Difficulty swallowing. Genitourinary:  [x] Chronic kidney disease   [] Difficult urination  [] Frequent urination   [] Blood in urine Skin:  [] Rashes   [] Ulcers  Psychological:  [] History of anxiety   []  History of major depression.  Physical Examination  There were no vitals filed for this visit. There is no height or weight on file to calculate BMI. Gen: WD/WN, NAD Head: [place]/AT, No temporalis wasting.  Ear/Nose/Throat: Hearing grossly intact, nares w/o erythema or drainage Eyes: PER, EOMI, sclera nonicteric.  Neck: Supple, no masses.  No bruit or JVD.  Pulmonary:  Good air movement, no audible wheezing, no use of accessory muscles.  Cardiac: RRR, normal S1, S2, no Murmurs. Vascular:  mild trophic changes, no open wounds Vessel Right Left  Radial Palpable Palpable  Gastrointestinal: soft, non-distended. No guarding/no peritoneal signs.  Musculoskeletal: M/S 5/5 throughout.  No visible deformity.  Neurologic: CN 2-12 intact. Pain and light touch intact in extremities.  Symmetrical.  Speech is fluent. Motor exam as listed  above. Psychiatric: Judgment intact, Mood & affect appropriate for pt's clinical situation. Dermatologic: No rashes or ulcers noted.  No changes consistent with cellulitis.   CBC Lab Results  Component Value Date   WBC 6.9 07/10/2022   HGB 11.2 (L) 07/10/2022   HCT 33.8 (L) 07/10/2022   MCV 91.4 07/10/2022   PLT 204 07/10/2022    BMET    Component Value Date/Time   NA 137 07/10/2022 0451   NA 140 08/08/2021 1052   K 3.3 (L) 07/10/2022 0451   CL 105 07/10/2022 0451   CO2 20 (L) 07/10/2022 0451   GLUCOSE 93 07/10/2022 0451   BUN 29 (H) 07/10/2022 0451   BUN 19 08/08/2021 1052   CREATININE 1.11 (H) 07/10/2022 0451   CALCIUM 9.1 07/10/2022 0451   GFRNONAA 55 (L) 07/10/2022 0451   GFRAA 81 08/21/2019 1012   Estimated Creatinine Clearance: 43.1 mL/min (A) (by C-G formula based on SCr of 1.11 mg/dL (H)).  COAG Lab Results  Component Value Date   INR 1.3 (H) 07/08/2022    Radiology CT Angio Abd/Pel w/ and/or w/o  Result Date: 07/10/2022 CLINICAL DATA:  67 year old female history aneurysm EXAM: CT ANGIOGRAPHY ABDOMEN AND PELVIS WITH CONTRAST AND WITHOUT CONTRAST TECHNIQUE: Multidetector CT imaging of the abdomen and pelvis was performed using the standard protocol during bolus administration of intravenous contrast. Multiplanar reconstructed images and MIPs were obtained and reviewed to evaluate the vascular anatomy. RADIATION DOSE REDUCTION: This exam was performed according to the departmental dose-optimization program which includes automated exposure control, adjustment of the mA and/or kV according to patient size and/or use of iterative reconstruction technique. CONTRAST:  OMNIPAQUE IOHEXOL 350 MG/ML SOLN COMPARISON:  None Available. FINDINGS: VASCULAR Aorta: Similar appearing diffuse ectasia the abdominal measuring up to approximately 45 mm in greatest short axis dimension at the level the diaphragmatic hiatus. Scattered fibrofatty and calcific atherosclerotic changes.  Again seen is a juxtarenal abdominal aortic aneurysm which measures up to 73 mm in greatest short axis dimension, enlarged from 62 mm on comparison by my measurements. Celiac: Occlusion of the proximal 1 cm celiac trunk distal reconstitution. SMA: Mild ostial stenosis secondary to atherosclerotic plaque, patent distally. Renals: Severe long segment stenosis in affective total occlusion of the left renal artery. Moderate ostial stenosis of the main right renal artery. There is an accessory upper pole right renal artery  arising just proximal to the main right renal artery from the abdominal aorta. IMA: Occluded due to origination from thrombosed portion of abdominal aortic aneurysm, though there is distal reconstitution via arc of Riolan. Inflow: Severe fibrofatty and calcific atherosclerotic changes to the common iliac arteries bilaterally with associated fusiform aneurysmal changes of the right common iliac artery measuring up to 18 mm. Just proximal to the bifurcation is an approximately 70% focal stenosis the proximal internal iliac artery is patent with heavy atherosclerotic burden. Severe stenosis versus focal occlusion of the proximal right external iliac artery with diminutive distal reconstitution. Fusiform aneurysmal changes of the left common iliac artery measuring up to 17 mm. The proximal internal iliac artery is patent. In near symmetric fashion, there is focal occlusion versus severe stenosis of the mid left external iliac artery which is patent but diminutive distally with coarse calcifications. Proximal Outflow: Patent but diminutive with scattered coarse atherosclerotic calcifications. Veins: The hepatic veins are widely patent. The portal system is widely patent and normal in caliber. The renal veins are patent bilaterally in standard anatomic configuration. No evidence of iliocaval thrombosis or anomaly. Review of the MIP images confirms the above findings. NON-VASCULAR Lower chest: No acute  abnormality. Hepatobiliary: No focal liver abnormality is seen. No gallstones, gallbladder wall thickening, or biliary dilatation. Pancreas: Unremarkable. No pancreatic ductal dilatation or surrounding inflammatory changes. Spleen: Normal in size without focal abnormality. Adrenals/Urinary Tract: Adrenal glands are unremarkable. Diffuse left renal atrophy with delayed opacification. No evidence of renal mass, nephrolithiasis, or hydronephrosis. The bladder is present and moderately distended. Stomach/Bowel: Stomach is within normal limits. Appendix is not definitively identified. No evidence of bowel wall thickening, distention, or inflammatory changes. Lymphatic: No abdominopelvic lymphadenopathy. Reproductive: Uterus and bilateral adnexa are unremarkable. Other: No abdominal wall hernia or abnormality. No abdominopelvic ascites. Musculoskeletal: No acute or significant osseous findings. IMPRESSION: VASCULAR 1. Interval enlargement of previously visualized fusiform juxtarenal abdominal aortic aneurysm measuring up to 7.3 cm, previously 6.2 cm. The patient is currently under the care of Dr. Wyn Quaker, Vascular Surgery. This recommendation follows ACR consensus guidelines: White Paper of the ACR Incidental Findings Committee II on Vascular Findings. J Am Coll Radiol 2013; 10:789-794. 2. Severe aortoiliac atherosclerosis (ICD10-I70.0) with associated fusiform aneurysmal changes of the bilateral common iliac arteries and severe focal stenosis of the bilateral external iliac arteries. The access vessels are diminutive bilaterally with coarse scattered atherosclerotic calcifications. 3. Occlusion of the left renal artery with associated left renal atrophy. 4. Occlusion of the celiac ostium with retrograde reconstitution via gastroduodenal artery collateralization. 5. Occlusion of the inferior mesenteric artery ostium with distal reconstitution via arc of Riolan collateralization. NON-VASCULAR No acute abdominopelvic  abnormality. Marliss Coots, MD [facility] Electronically Signed   By: Marliss Coots M.D.   On: 07/10/2022 13:26   PERIPHERAL VASCULAR CATHETERIZATION  Result Date: 07/10/2022 See surgical note for result.  DG Foot Complete Left  Result Date: 07/08/2022 CLINICAL DATA:  Hypertension and black toes. Bilateral foot and toe pain. EXAM: LEFT FOOT - COMPLETE 3+ VIEW; RIGHT FOOT COMPLETE - 3+ VIEW COMPARISON:  None Available. FINDINGS: Right: There is no acute fracture or dislocation. There is mild hallux valgus deformity. Alignment is otherwise normal. Screw fixation of the proximal metaphysis of the first metatarsal is noted without evidence of complication. There is no perihardware lucency. There is no bony erosion. The joint spaces are overall preserved. There is mild Achilles enthesopathy. The soft tissues are unremarkable. There is no soft  tissue gas or radiopaque foreign body. Left: There is no acute fracture or dislocation. There is mild hallux valgus deformity. Alignment is otherwise normal. There is no osseous erosion or destruction. The joint spaces are overall preserved. There is no soft tissue gas or radiopaque foreign body. IMPRESSION: 1. No acute finding in either foot. No soft tissue gas or radiopaque foreign body, and no osseous erosion or destruction. 2. Screw fixation of the proximal metaphysis of the first metatarsal on the right without evidence of complication. 3. Mild hallux valgus deformity bilaterally. Electronically Signed   By: Lesia Hausen M.D.   On: 07/08/2022 09:53   DG Foot Complete Right  Result Date: 07/08/2022 CLINICAL DATA:  Hypertension and black toes. Bilateral foot and toe pain. EXAM: LEFT FOOT - COMPLETE 3+ VIEW; RIGHT FOOT COMPLETE - 3+ VIEW COMPARISON:  None Available. FINDINGS: Right: There is no acute fracture or dislocation. There is mild hallux valgus deformity. Alignment is otherwise normal. Screw fixation of  the proximal metaphysis of the first metatarsal is noted without evidence of complication. There is no perihardware lucency. There is no bony erosion. The joint spaces are overall preserved. There is mild Achilles enthesopathy. The soft tissues are unremarkable. There is no soft tissue gas or radiopaque foreign body. Left: There is no acute fracture or dislocation. There is mild hallux valgus deformity. Alignment is otherwise normal. There is no osseous erosion or destruction. The joint spaces are overall preserved. There is no soft tissue gas or radiopaque foreign body. IMPRESSION: 1. No acute finding in either foot. No soft tissue gas or radiopaque foreign body, and no osseous erosion or destruction. 2. Screw fixation of the proximal metaphysis of the first metatarsal on the right without evidence of complication. 3. Mild hallux valgus deformity bilaterally. Electronically Signed   By: Lesia Hausen M.D.   On: 07/08/2022 09:53   DG Chest 2 View  Result Date: 07/08/2022 CLINICAL DATA:  Discoloration of the toes EXAM: CHEST - 2 VIEW COMPARISON:  Chest radiograph dated 11/17/2018 FINDINGS: Normal lung volumes. Bilateral interstitial opacities. No pleural effusion or pneumothorax. Enlarged cardiomediastinal silhouette. No acute osseous abnormality. IMPRESSION: 1. Mild bilateral pulmonary edema. 2. Enlarged cardiomediastinal silhouette. Electronically Signed   By: Agustin Cree M.D.   On: 07/08/2022 08:57   VAS [place] ABI WITH/WO TBI  Result Date: 06/26/2022  LOWER EXTREMITY DOPPLER STUDY Patient Name:  Jasmine Camacho  Date of Exam:   06/24/2022 Medical Rec #: [id number]      Accession #:    [id number] Date of Birth: [birth year]      Patient Gender: F Patient Age:   44 years Exam Location:  [facility] Procedure:      VAS [place] ABI WITH/WO TBI Referring Phys: Sheppard Plumber --------------------------------------------------------------------------------  Indications: Claudication, rest pain, and peripheral artery  disease. Known              aneurysms High Risk Factors: Hypertension, current smoker.  Performing Technologist: Hardie Lora RVT  Examination Guidelines: A complete evaluation includes at minimum, Doppler waveform signals and systolic blood pressure reading at the level of bilateral brachial, anterior tibial, and posterior tibial arteries, when vessel segments are accessible. Bilateral testing is considered an integral part of a complete examination. Photoelectric Plethysmograph (PPG) waveforms and toe systolic pressure readings are included as required and additional duplex testing as needed. Limited examinations for reoccurring indications may be performed as noted.  ABI Findings: +---------+------------------+-----+----------+--------+ Right    Rt Pressure (mmHg)IndexWaveform  Comment  +---------+------------------+-----+----------+--------+  Brachial 176                                       +---------+------------------+-----+----------+--------+ PTA      112               0.64 monophasic         +---------+------------------+-----+----------+--------+ DP       78                0.44 monophasic         +---------+------------------+-----+----------+--------+ Great Toe24                0.14                    +---------+------------------+-----+----------+--------+ +---------+------------------+-----+----------+-------+ Left     Lt Pressure (mmHg)IndexWaveform  Comment +---------+------------------+-----+----------+-------+ Brachial 157                                      +---------+------------------+-----+----------+-------+ PTA      139               0.79 monophasic        +---------+------------------+-----+----------+-------+ DP       114               0.65 monophasic        +---------+------------------+-----+----------+-------+ Great Toe0                 0.00                   +---------+------------------+-----+----------+-------+  +-------+-----------+-----------+------------+------------+ ABI/TBIToday's ABIToday's TBIPrevious ABIPrevious TBI +-------+-----------+-----------+------------+------------+ Right  0.64       0.14                                +-------+-----------+-----------+------------+------------+ Left   0.79       0.00                                +-------+-----------+-----------+------------+------------+  Summary: Right: Resting right ankle-brachial index indicates moderate right lower extremity arterial disease. The right toe-brachial index is abnormal. Left: Resting left ankle-brachial index indicates moderate left lower extremity arterial disease. The left toe-brachial index is abnormal. *See table(s) above for measurements and observations.  Electronically signed by Levora Dredge MD on 06/26/2022 at 8:32:57 AM.    Final      Assessment/Plan 1. Atherosclerosis of native artery of both lower extremities with rest pain (HCC) I had a very long discussion with the patient as well as her husband and her daughter.  We discussed that at this point her aneurysm has grown very rapidly which is a very poor prognostic sign.  It is likely not even stentable at this time.  1 facet of the progression is that she is now thrombosed the right iliac and there is profound disease of the left iliac system.  There is a large amount of thrombus within the aneurysm itself and therefore stenting of the aorta iliac system has a very high chance of propagating thrombus and worsening the overall situation rather than fixing it.  Furthermore any stenting of the iliacs although would relieve some of the pain would not fix the aneurysm and  we are still faced with a very large very rapidly growing aneurysm.  Under the circumstances I believe that evaluation by hospice is warranted.  I have placed the order for a ambulatory referral.  Also given the circumstances I feel that Percocet would be a more reasonable option for  pain relief than hydrocodone.   A total of 50 minutes was spent with this patient and greater than 50% was spent in counseling and coordination of care with the patient.  Discussion included the treatment options for vascular disease including indications for surgery and intervention.  Also discussed is the appropriate timing of treatment.  In addition medical therapy was discussed. - Ambulatory referral to Hospice  2. Abdominal aortic aneurysm (AAA) without rupture, unspecified part (HCC) See #1 - Ambulatory referral to Hospice  3. Thoracoabdominal aortic aneurysm (TAAA) without rupture, unspecified part (HCC) See #1  4. Primary hypertension Continue antihypertensive medications as already ordered, these medications have been reviewed and there are no changes at this time.  5. Hyperlipidemia, unspecified hyperlipidemia type Continue statin as ordered and reviewed, no changes at this time    Levora Dredge, MD  07/23/2022 8:51 AM

## 2022-07-24 ENCOUNTER — Telehealth (INDEPENDENT_AMBULATORY_CARE_PROVIDER_SITE_OTHER): Payer: Self-pay | Admitting: Nurse Practitioner

## 2022-07-24 ENCOUNTER — Other Ambulatory Visit (INDEPENDENT_AMBULATORY_CARE_PROVIDER_SITE_OTHER): Payer: Self-pay | Admitting: Nurse Practitioner

## 2022-07-24 MED ORDER — OXYCODONE-ACETAMINOPHEN 5-325 MG PO TABS: 1.0000 | ORAL_TABLET | ORAL | 0 refills | Status: DC | PRN

## 2022-07-24 NOTE — Telephone Encounter (Signed)
Pt's husband made aware and states understanding

## 2022-07-24 NOTE — Telephone Encounter (Signed)
Patient husband called in stating that patient had a procedure with Dr. Gilda Crease on 07/23/2022 and was told pain medication was being sent in and has nothing at the pharmacy wanting to know if this can be sent in today ASAP    Please call when sent    [id number] - [place], [place] - [address] AT [facility] [address] [facility]

## 2022-07-24 NOTE — Telephone Encounter (Signed)
Percocet sent in 

## 2022-07-26 ENCOUNTER — Encounter (INDEPENDENT_AMBULATORY_CARE_PROVIDER_SITE_OTHER): Payer: Self-pay | Admitting: Vascular Surgery

## 2022-07-26 DIAGNOSIS — I70229 Atherosclerosis of native arteries of extremities with rest pain, unspecified extremity: Secondary | ICD-10-CM | POA: Insufficient documentation

## 2022-07-28 ENCOUNTER — Ambulatory Visit (INDEPENDENT_AMBULATORY_CARE_PROVIDER_SITE_OTHER): Payer: PPO | Admitting: Family Medicine

## 2022-07-28 ENCOUNTER — Encounter: Payer: Self-pay | Admitting: Family Medicine

## 2022-07-28 VITALS — BP 179/139 | HR 93 | Temp 98.3°F | Ht 63.0 in | Wt 113.8 lb

## 2022-07-28 DIAGNOSIS — F5101 Primary insomnia: Secondary | ICD-10-CM | POA: Diagnosis not present

## 2022-07-28 DIAGNOSIS — I10 Essential (primary) hypertension: Secondary | ICD-10-CM

## 2022-07-28 DIAGNOSIS — E785 Hyperlipidemia, unspecified: Secondary | ICD-10-CM | POA: Diagnosis not present

## 2022-07-28 DIAGNOSIS — F419 Anxiety disorder, unspecified: Secondary | ICD-10-CM

## 2022-07-28 DIAGNOSIS — Z0000 Encounter for general adult medical examination without abnormal findings: Secondary | ICD-10-CM | POA: Diagnosis not present

## 2022-07-28 DIAGNOSIS — N182 Chronic kidney disease, stage 2 (mild): Secondary | ICD-10-CM

## 2022-07-28 DIAGNOSIS — I70223 Atherosclerosis of native arteries of extremities with rest pain, bilateral legs: Secondary | ICD-10-CM | POA: Diagnosis not present

## 2022-07-28 DIAGNOSIS — D509 Iron deficiency anemia, unspecified: Secondary | ICD-10-CM | POA: Diagnosis not present

## 2022-07-28 DIAGNOSIS — E0590 Thyrotoxicosis, unspecified without thyrotoxic crisis or storm: Secondary | ICD-10-CM | POA: Diagnosis not present

## 2022-07-28 DIAGNOSIS — I700 Atherosclerosis of aorta: Secondary | ICD-10-CM | POA: Diagnosis not present

## 2022-07-28 DIAGNOSIS — J449 Chronic obstructive pulmonary disease, unspecified: Secondary | ICD-10-CM | POA: Diagnosis not present

## 2022-07-28 DIAGNOSIS — Z515 Encounter for palliative care: Secondary | ICD-10-CM

## 2022-07-28 MED ORDER — ESZOPICLONE 3 MG PO TABS: 3.0000 mg | ORAL_TABLET | Freq: Every day | ORAL | 2 refills | Status: DC

## 2022-07-28 MED ORDER — OXYCODONE-ACETAMINOPHEN 5-325 MG PO TABS: 1.0000 | ORAL_TABLET | Freq: Three times a day (TID) | ORAL | 0 refills | Status: DC | PRN

## 2022-07-28 MED ORDER — FLUTICASONE-SALMETEROL 100-50 MCG/ACT IN AEPB: 1.0000 | INHALATION_SPRAY | Freq: Two times a day (BID) | RESPIRATORY_TRACT | 3 refills | Status: DC

## 2022-07-28 MED ORDER — LOSARTAN POTASSIUM 100 MG PO TABS: 100.0000 mg | ORAL_TABLET | Freq: Every day | ORAL | 1 refills | Status: DC

## 2022-07-28 MED ORDER — ONDANSETRON HCL 4 MG PO TABS: 4.0000 mg | ORAL_TABLET | Freq: Three times a day (TID) | ORAL | 1 refills | Status: DC | PRN

## 2022-07-28 MED ORDER — ALBUTEROL SULFATE HFA 108 (90 BASE) MCG/ACT IN AERS: INHALATION_SPRAY | RESPIRATORY_TRACT | 6 refills | Status: DC

## 2022-07-28 NOTE — Progress Notes (Unsigned)
BP (!) 179/139 (BP Location: Left Arm, Cuff Size: Normal)   Pulse 93   Temp 98.3 F (36.8 C) (Oral)   Ht 5\' 3"  (1.6 m)   Wt 113 lb 12.8 oz (51.6 kg)   LMP  (LMP Unknown)   SpO2 98%   BMI 20.16 kg/m    Subjective:    Patient ID: Jasmine Camacho, female    DOB: Feb 06, 1956, 67 y.o.   MRN: [id number]  HPI: Jasmine Camacho is a 67 y.o. female presenting on 07/28/2022 for comprehensive medical examination. Current medical complaints include:  She currently lives with: husband Menopausal Symptoms: {Blank single:19197::"yes","no"}  Depression Screen done today and results listed below:     07/28/2022    9:52 AM 07/07/2022    4:26 PM 06/23/2022   10:46 AM 04/24/2022   10:50 AM 01/23/2022   10:43 AM  Depression screen PHQ 2/9  Decreased Interest 0 0 0 0 0  Down, Depressed, Hopeless 1 0 1 0 0  PHQ - 2 Score 1 0 1 0 0  Altered sleeping 1 1 0 0 0  Tired, decreased energy 0 1 1 0 3  Change in appetite 0 0 0 0 1  Feeling bad or failure about yourself  0 0 0 0 0  Trouble concentrating 0 1 0 0 0  Moving slowly or fidgety/restless 0 0 0 0 0  Suicidal thoughts 0 0 0 0 0  PHQ-9 Score 2 3 2  0 4  Difficult doing work/chores Not difficult at all Somewhat difficult Somewhat difficult Not difficult at all Somewhat difficult    The patient {has/does not WUJW:11914} a history of falls. I {did/did not:19850} complete a risk assessment for falls. A plan of care for falls {was/was not:19852} documented.   Past Medical History:  Past Medical History:  Diagnosis Date   Acute respiratory failure (HCC) 11/17/2018   Aphasia    Hypertension    Insomnia    Menopausal symptom    Menorrhagia    Pneumonia    Seasonal allergies    Tachycardia    Thyroid disease    Ventricular premature contractions     Surgical History:  Past Surgical History:  Procedure Laterality Date   aneurysm surgery  09/2003   DILATION AND CURETTAGE OF UTERUS  2007   LOWER EXTREMITY ANGIOGRAPHY Left 07/10/2022   Procedure:  Lower Extremity Angiography;  Surgeon: Annice Needy, MD;  Location: [facility]IVE [facility];  Service: Cardiovascular;  Laterality: Left;    Medications:  Current Outpatient Medications on File Prior to Visit  Medication Sig   acetaminophen (TYLENOL) 650 MG CR tablet Take 650 mg by mouth every 8 (eight) hours as needed (pain or fever).   albuterol (VENTOLIN HFA) 108 (90 Base) MCG/ACT inhaler INL 2 PFS PO Q 4 TO 6 H PRN   cephALEXin (KEFLEX) 500 MG capsule Take 1 capsule (500 mg total) by mouth 4 (four) times daily.   fluticasone-salmeterol (ADVAIR) 100-50 MCG/ACT AEPB Inhale 1 puff into the lungs 2 (two) times daily.   furosemide (LASIX) 20 MG tablet TAKE 2 TABLETS(40 MG) BY MOUTH DAILY   losartan (COZAAR) 100 MG tablet Take 100 mg by mouth daily.   methimazole (TAPAZOLE) 5 MG tablet Take 5 mg by mouth daily.    metoprolol succinate (TOPROL-XL) 25 MG 24 hr tablet Take 0.5 tablets (12.5 mg total) by mouth at bedtime.   montelukast (SINGULAIR) 10 MG tablet Take 1 tablet (10 mg total) by mouth at bedtime as needed (  for allergies and stuffiness). TAKE 1 TABLET(10 MG) BY MOUTH AT BEDTIME   omeprazole (PRILOSEC) 20 MG capsule TAKE 1 CAPSULE(20 MG) BY MOUTH DAILY   oxyCODONE-acetaminophen (PERCOCET/ROXICET) 5-325 MG tablet Take 1 tablet by mouth every 4 (four) hours as needed for severe pain.   pravastatin (PRAVACHOL) 20 MG tablet Take 1 tablet (20 mg total) by mouth every Monday, Wednesday, and Friday.   Eszopiclone 3 MG TABS Take 1 tablet (3 mg total) by mouth at bedtime. TAKE 1 TABLET BY MOUTH IMMEDIATELY BEFORE BEDTIME. MUST LAST 30 DAYS   HYDROcodone-acetaminophen (NORCO/VICODIN) 5-325 MG tablet Take 1 tablet by mouth every 8 (eight) hours. (Patient not taking: Reported on 07/28/2022)   ondansetron (ZOFRAN) 4 MG tablet Take 4 mg by mouth every 8 (eight) hours as needed. (Patient not taking: Reported on 07/28/2022)   No current facility-administered medications on file prior to visit.    Allergies:   Allergies  Allergen Reactions   Ambien [Zolpidem]    Asa [Aspirin]    Benazepril Hcl    Carvedilol Other (See Comments)    GI upset   Codeine Sulfate Nausea Only   Metoprolol Tartrate    Other Other (See Comments)    Valtura   Aldactone [Spironolactone] Other (See Comments)    Abdominal pain    Hctz [Hydrochlorothiazide] Rash    Social History:  Social History   Socioeconomic History   Marital status: Married    Spouse name: Not on file   Number of children: Not on file   Years of education: Not on file   Highest education level: Not on file  Occupational History   Not on file  Tobacco Use   Smoking status: Every Day    Packs/day: .25    Types: Cigarettes   Smokeless tobacco: Never  Vaping Use   Vaping Use: Never used  Substance and Sexual Activity   Alcohol use: No    Alcohol/week: 0.0 standard drinks of alcohol   Drug use: No   Sexual activity: Never  Other Topics Concern   Not on file  Social History Narrative   Not on file   Social Determinants of Health   Financial Resource Strain: Not on file  Food Insecurity: No Food Insecurity (07/15/2022)   Hunger Vital Sign    Worried About Running Out of Food in the Last Year: Never true    Ran Out of Food in the Last Year: Never true  Transportation Needs: No Transportation Needs (07/15/2022)   PRAPARE - Administrator, Civil Service (Medical): No    Lack of Transportation (Non-Medical): No  Physical Activity: Not on file  Stress: Not on file  Social Connections: Not on file  Intimate Partner Violence: Not At Risk (07/08/2022)   Humiliation, Afraid, Rape, and Kick questionnaire    Fear of Current or Ex-Partner: No    Emotionally Abused: No    Physically Abused: No    Sexually Abused: No   Social History   Tobacco Use  Smoking Status Every Day   Packs/day: .25   Types: Cigarettes  Smokeless Tobacco Never   Social History   Substance and Sexual Activity  Alcohol Use No   Alcohol/week: 0.0  standard drinks of alcohol    Family History:  Family History  Problem Relation Age of Onset   Stroke Mother    Diabetes Mother    Heart disease Mother    Hypertension Mother    Alcohol abuse Father    Cancer Father  liver   Diabetes Brother    Hypertension Brother    Heart disease Brother    Cancer Brother        colon    Past medical history, surgical history, medications, allergies, family history and social history reviewed with patient today and changes made to appropriate areas of the chart.   Review of Systems  Constitutional:  Positive for diaphoresis. Negative for chills, fever, malaise/fatigue and weight loss.  HENT: Negative.    Eyes:  Positive for blurred vision. Negative for double vision, photophobia, pain, discharge and redness.  Respiratory: Negative.    Cardiovascular:  Positive for leg swelling. Negative for chest pain, palpitations, orthopnea, claudication and PND.  Gastrointestinal:  Positive for nausea. Negative for abdominal pain, blood in stool, constipation, diarrhea, heartburn, melena and vomiting.  Genitourinary: Negative.   Musculoskeletal:  Positive for myalgias. Negative for back pain, falls, joint pain and neck pain.  Skin: Negative.   Neurological:  Positive for dizziness. Negative for tingling, tremors, sensory change, speech change, focal weakness, seizures, loss of consciousness, weakness and headaches.  Endo/Heme/Allergies: Negative.   Psychiatric/Behavioral:  Positive for depression. Negative for hallucinations, memory loss, substance abuse and suicidal ideas. The patient is not nervous/anxious and does not have insomnia.    All other ROS negative except what is listed above and in the HPI.      Objective:    BP (!) 179/139 (BP Location: Left Arm, Cuff Size: Normal)   Pulse 93   Temp 98.3 F (36.8 C) (Oral)   Ht 5\' 3"  (1.6 m)   Wt 113 lb 12.8 oz (51.6 kg)   LMP  (LMP Unknown)   SpO2 98%   BMI 20.16 kg/m   Wt Readings from  Last 3 Encounters:  07/28/22 113 lb 12.8 oz (51.6 kg)  07/10/22 121 lb 0.5 oz (54.9 kg)  07/07/22 115 lb 6.4 oz (52.3 kg)    Physical Exam  Results for orders placed or performed during the hospital encounter of 07/08/22  Culture, blood (routine x 2)   Specimen: BLOOD  Result Value Ref Range   Specimen Description BLOOD RIGHT AC    Special Requests      BOTTLES DRAWN AEROBIC AND ANAEROBIC Blood Culture adequate volume   Culture      NO GROWTH 5 DAYS Performed at [facility], [address] [place], [place] [address]    Report Status 07/13/2022 FINAL   Culture, blood (routine x 2)   Specimen: BLOOD  Result Value Ref Range   Specimen Description BLOOD LEFT FA    Special Requests      BOTTLES DRAWN AEROBIC AND ANAEROBIC Blood Culture adequate volume   Culture      NO GROWTH 5 DAYS Performed at [facility], [address], [place], [place] [address]    Report Status 07/13/2022 FINAL   MRSA Next Gen by PCR, Nasal   Specimen: Nasal Mucosa; Nasal Swab  Result Value Ref Range   MRSA by PCR Next Gen NOT DETECTED NOT DETECTED  Basic metabolic panel  Result Value Ref Range   Sodium 138 135 - 145 mmol/L   Potassium 3.6 3.5 - 5.1 mmol/L   Chloride 106 98 - 111 mmol/L   CO2 24 22 - 32 mmol/L   Glucose, Bld 99 70 - 99 mg/dL   BUN 27 (H) 8 - 23 mg/dL   Creatinine, Ser 0.98 (H) 0.44 - 1.00 mg/dL   Calcium 9.0 8.9 - 11.9 mg/dL   GFR, Estimated  51 (L) >60 mL/min   Anion gap 8 5 - 15  CBC  Result Value Ref Range   WBC 5.8 4.0 - 10.5 K/uL   RBC 3.77 (L) 3.87 - 5.11 MIL/uL   Hemoglobin 11.4 (L) 12.0 - 15.0 g/dL   HCT 16.1 (L) 09.6 - 04.5 %   MCV 93.6 80.0 - 100.0 fL   MCH 30.2 26.0 - 34.0 pg   MCHC 32.3 30.0 - 36.0 g/dL   RDW 40.9 81.1 - 91.4 %   Platelets 210 150 - 400 K/uL   nRBC 0.0 0.0 - 0.2 %  Lactic acid, plasma  Result Value Ref Range   Lactic Acid, Venous 0.9 0.5 - 1.9 mmol/L  Hemoglobin A1c  Result Value Ref Range   Hgb A1c MFr Bld 5.4 4.8 -  5.6 %   Mean Plasma Glucose 108 mg/dL  Lipid panel  Result Value Ref Range   Cholesterol 111 0 - 200 mg/dL   Triglycerides 61 <782 mg/dL   HDL 37 (L) >95 mg/dL   Total CHOL/HDL Ratio 3.0 RATIO   VLDL 12 0 - 40 mg/dL   LDL Cholesterol 62 0 - 99 mg/dL  Brain natriuretic peptide  Result Value Ref Range   B Natriuretic Peptide >4,500.0 (H) 0.0 - 100.0 pg/mL  Sedimentation rate  Result Value Ref Range   Sed Rate 30 0 - 30 mm/hr  C-reactive protein  Result Value Ref Range   CRP 2.0 (H) <1.0 mg/dL  HIV Antibody (routine testing w rflx)  Result Value Ref Range   HIV Screen 4th Generation wRfx Non Reactive Non Reactive  Protime-INR  Result Value Ref Range   Prothrombin Time 15.9 (H) 11.4 - 15.2 seconds   INR 1.3 (H) 0.8 - 1.2  APTT  Result Value Ref Range   aPTT 41 (H) 24 - 36 seconds  Basic metabolic panel  Result Value Ref Range   Sodium 138 135 - 145 mmol/L   Potassium 3.7 3.5 - 5.1 mmol/L   Chloride 106 98 - 111 mmol/L   CO2 20 (L) 22 - 32 mmol/L   Glucose, Bld 92 70 - 99 mg/dL   BUN 26 (H) 8 - 23 mg/dL   Creatinine, Ser 6.21 (H) 0.44 - 1.00 mg/dL   Calcium 8.9 8.9 - 30.8 mg/dL   GFR, Estimated 51 (L) >60 mL/min   Anion gap 12 5 - 15  CBC  Result Value Ref Range   WBC 6.1 4.0 - 10.5 K/uL   RBC 3.68 (L) 3.87 - 5.11 MIL/uL   Hemoglobin 11.3 (L) 12.0 - 15.0 g/dL   HCT 65.7 (L) 84.6 - 96.2 %   MCV 92.1 80.0 - 100.0 fL   MCH 30.7 26.0 - 34.0 pg   MCHC 33.3 30.0 - 36.0 g/dL   RDW 95.2 84.1 - 32.4 %   Platelets 202 150 - 400 K/uL   nRBC 0.0 0.0 - 0.2 %  Glucose, capillary  Result Value Ref Range   Glucose-Capillary 82 70 - 99 mg/dL  CBC  Result Value Ref Range   WBC 6.9 4.0 - 10.5 K/uL   RBC 3.70 (L) 3.87 - 5.11 MIL/uL   Hemoglobin 11.2 (L) 12.0 - 15.0 g/dL   HCT 40.1 (L) 02.7 - 25.3 %   MCV 91.4 80.0 - 100.0 fL   MCH 30.3 26.0 - 34.0 pg   MCHC 33.1 30.0 - 36.0 g/dL   RDW 66.4 40.3 - 47.4 %   Platelets 204 150 - 400 K/uL  nRBC 0.0 0.0 - 0.2 %  Basic metabolic  panel  Result Value Ref Range   Sodium 137 135 - 145 mmol/L   Potassium 3.3 (L) 3.5 - 5.1 mmol/L   Chloride 105 98 - 111 mmol/L   CO2 20 (L) 22 - 32 mmol/L   Glucose, Bld 93 70 - 99 mg/dL   BUN 29 (H) 8 - 23 mg/dL   Creatinine, Ser 4.54 (H) 0.44 - 1.00 mg/dL   Calcium 9.1 8.9 - 09.8 mg/dL   GFR, Estimated 55 (L) >60 mL/min   Anion gap 12 5 - 15  Glucose, capillary  Result Value Ref Range   Glucose-Capillary 89 70 - 99 mg/dL  Troponin I (High Sensitivity)  Result Value Ref Range   Troponin I (High Sensitivity) 45 (H) <18 ng/L  Troponin I (High Sensitivity)  Result Value Ref Range   Troponin I (High Sensitivity) 41 (H) <18 ng/L      Assessment & Plan:   Problem List Items Addressed This Visit       Cardiovascular and Mediastinum   Hypertension   Relevant Orders   CBC with Differential/Platelet   Comprehensive metabolic panel   Microalbumin, Urine Waived   Aortic atherosclerosis (HCC) - Primary   Relevant Orders   CBC with Differential/Platelet   Comprehensive metabolic panel   Lipid Panel w/o Chol/HDL Ratio     Respiratory   Chronic obstructive pulmonary disease (HCC)   Relevant Orders   CBC with Differential/Platelet   Comprehensive metabolic panel     Endocrine   Hyperthyroidism   Relevant Orders   CBC with Differential/Platelet   Comprehensive metabolic panel   TSH     Genitourinary   CKD (chronic kidney disease) stage 2, GFR 60-89 ml/min   Relevant Orders   CBC with Differential/Platelet   Comprehensive metabolic panel   Urinalysis, Routine w reflex microscopic     Other   Insomnia   Relevant Orders   CBC with Differential/Platelet   Comprehensive metabolic panel   Iron deficiency anemia   Relevant Orders   CBC with Differential/Platelet   Comprehensive metabolic panel   Anxiety   Relevant Orders   CBC with Differential/Platelet   Comprehensive metabolic panel   HLD (hyperlipidemia)   Relevant Orders   CBC with Differential/Platelet    Comprehensive metabolic panel   Lipid Panel w/o Chol/HDL Ratio     Follow up plan: No follow-ups on file.   LABORATORY TESTING:  - Pap smear: {Blank single:19197::"pap done","not applicable","up to date","done elsewhere"}  IMMUNIZATIONS:   - Tdap: Tetanus vaccination status reviewed: {tetanus status:315746}. - Influenza: {Blank single:19197::"Up to date","Administered today","Postponed to flu season","Refused","Given elsewhere"} - Pneumovax: {Blank single:19197::"Up to date","Administered today","Not applicable","Refused","Given elsewhere"} - Prevnar: {Blank single:19197::"Up to date","Administered today","Not applicable","Refused","Given elsewhere"} - COVID: {Blank single:19197::"Up to date","Administered today","Not applicable","Refused","Given elsewhere"} - HPV: {Blank single:19197::"Up to date","Administered today","Not applicable","Refused","Given elsewhere"} - Shingrix vaccine: {Blank single:19197::"Up to date","Administered today","Not applicable","Refused","Given elsewhere"}  SCREENING: -Mammogram: {Blank single:19197::"Up to date","Ordered today","Not applicable","Refused","Done elsewhere"}  - Colonoscopy: {Blank single:19197::"Up to date","Ordered today","Not applicable","Refused","Done elsewhere"}  - Bone Density: {Blank single:19197::"Up to date","Ordered today","Not applicable","Refused","Done elsewhere"}  -Hearing Test: {Blank single:19197::"Up to date","Ordered today","Not applicable","Refused","Done elsewhere"}  -Spirometry: {Blank single:19197::"Up to date","Ordered today","Not applicable","Refused","Done elsewhere"}   PATIENT COUNSELING:   Advised to take 1 mg of folate supplement per day if capable of pregnancy.   Sexuality: Discussed sexually transmitted diseases, partner selection, use of condoms, avoidance of unintended pregnancy  and contraceptive alternatives.   Advised to avoid cigarette smoking.  I discussed  with the patient that most people either abstain  from alcohol or drink within safe limits (<=14/week and <=4 drinks/occasion for males, <=7/weeks and <= 3 drinks/occasion for females) and that the risk for alcohol disorders and other health effects rises proportionally with the number of drinks per week and how often a drinker exceeds daily limits.  Discussed cessation/primary prevention of drug use and availability of treatment for abuse.   Diet: Encouraged to adjust caloric intake to maintain  or achieve ideal body weight, to reduce intake of dietary saturated fat and total fat, to limit sodium intake by avoiding high sodium foods and not adding table salt, and to maintain adequate dietary potassium and calcium preferably from fresh fruits, vegetables, and low-fat dairy products.    stressed the importance of regular exercise  Injury prevention: Discussed safety belts, safety helmets, smoke detector, smoking near bedding or upholstery.   Dental health: Discussed importance of regular tooth brushing, flossing, and dental visits.    NEXT PREVENTATIVE PHYSICAL DUE IN 1 YEAR. No follow-ups on file.

## 2022-07-29 LAB — COMPREHENSIVE METABOLIC PANEL
ALT: 20 IU/L (ref 0–32)
AST: 18 IU/L (ref 0–40)
Albumin: 4.1 g/dL (ref 3.9–4.9)
Alkaline Phosphatase: 205 IU/L — ABNORMAL HIGH (ref 44–121)
BUN/Creatinine Ratio: 16 (ref 12–28)
BUN: 18 mg/dL (ref 8–27)
Bilirubin Total: 0.4 mg/dL (ref 0.0–1.2)
CO2: 21 mmol/L (ref 20–29)
Calcium: 9 mg/dL (ref 8.7–10.3)
Chloride: 99 mmol/L (ref 96–106)
Creatinine, Ser: 1.14 mg/dL — ABNORMAL HIGH (ref 0.57–1.00)
Globulin, Total: 2.7 g/dL (ref 1.5–4.5)
Glucose: 96 mg/dL (ref 70–99)
Potassium: 3.8 mmol/L (ref 3.5–5.2)
Sodium: 135 mmol/L (ref 134–144)
Total Protein: 6.8 g/dL (ref 6.0–8.5)
eGFR: 53 mL/min/{1.73_m2} — ABNORMAL LOW (ref >59)

## 2022-07-29 LAB — CBC WITH DIFFERENTIAL/PLATELET
Basophils Absolute: 0.1 10*3/uL (ref 0.0–0.2)
Basos: 1 %
EOS (ABSOLUTE): 0.3 10*3/uL (ref 0.0–0.4)
Eos: 5 %
Hematocrit: 35 % (ref 34.0–46.6)
Hemoglobin: 11.4 g/dL (ref 11.1–15.9)
Immature Grans (Abs): 0 10*3/uL (ref 0.0–0.1)
Immature Granulocytes: 0 %
Lymphocytes Absolute: 0.8 10*3/uL (ref 0.7–3.1)
Lymphs: 11 %
MCH: 30.2 pg (ref 26.6–33.0)
MCHC: 32.6 g/dL (ref 31.5–35.7)
MCV: 93 fL (ref 79–97)
Monocytes Absolute: 0.7 10*3/uL (ref 0.1–0.9)
Monocytes: 9 %
Neutrophils Absolute: 5.6 10*3/uL (ref 1.4–7.0)
Neutrophils: 74 %
Platelets: 264 10*3/uL (ref 150–450)
RBC: 3.78 x10E6/uL (ref 3.77–5.28)
RDW: 14.4 % (ref 11.7–15.4)
WBC: 7.5 10*3/uL (ref 3.4–10.8)

## 2022-07-29 LAB — TSH: TSH: 7.4 u[IU]/mL — ABNORMAL HIGH (ref 0.450–4.500)

## 2022-07-29 LAB — LIPID PANEL W/O CHOL/HDL RATIO
Cholesterol, Total: 121 mg/dL (ref 100–199)
HDL: 43 mg/dL (ref >39)
LDL Chol Calc (NIH): 62 mg/dL (ref 0–99)
Triglycerides: 78 mg/dL (ref 0–149)
VLDL Cholesterol Cal: 16 mg/dL (ref 5–40)

## 2022-07-30 DIAGNOSIS — Z515 Encounter for palliative care: Secondary | ICD-10-CM | POA: Insufficient documentation

## 2022-07-30 NOTE — Assessment & Plan Note (Signed)
BP running high. Likely due to pain. Will treat pain and recheck 1 month. Call with any concerns.

## 2022-07-30 NOTE — Assessment & Plan Note (Signed)
Under good control on current regimen. Continue current regimen. Continue to monitor. Call with any concerns. Refills given. Labs drawn today.   

## 2022-07-30 NOTE — Assessment & Plan Note (Signed)
Under good control on current regimen. Continue current regimen. Continue to monitor. Call with any concerns. Refills given.   

## 2022-07-30 NOTE — Assessment & Plan Note (Signed)
Unable to be treated. Will keep pain under good control. Hospice ordered. Refill of oxycodone given today. Follow up 1 month.

## 2022-07-30 NOTE — Assessment & Plan Note (Signed)
Under good control on current regimen. Continue current regimen. Continue to monitor. Call with any concerns. Refills given for 3 months. Follow up 3 months.    

## 2022-07-30 NOTE — Assessment & Plan Note (Signed)
Rechecking labs today. Await results. Treat as needed.  °

## 2022-07-30 NOTE — Assessment & Plan Note (Signed)
Will keep BP and cholesterol under good control. Continue to monitor. Call with any concerns.  

## 2022-07-30 NOTE — Assessment & Plan Note (Signed)
Continue to follow with endocrine. Call with any concerns. Continue to monitor.  

## 2022-07-30 NOTE — Assessment & Plan Note (Signed)
Hospice to be coming out this week. Continue to monitor. Call with any concerns.

## 2022-07-31 ENCOUNTER — Other Ambulatory Visit: Payer: Self-pay

## 2022-07-31 DIAGNOSIS — Z515 Encounter for palliative care: Secondary | ICD-10-CM

## 2022-07-31 NOTE — Progress Notes (Signed)
PATIENT NAME: Jasmine Camacho DOB: 1955/10/01 MRN: [id number]  PRIMARY CARE PROVIDER: Dorcas Carrow, DO  RESPONSIBLE PARTY:  Acct ID - Guarantor Home Phone Work Phone Relationship Acct Type  [id number] SOLIANA, KITKO* [phone]  Self P/F     [address], [place], [place] [address]   I connected with [name]-spouse for  ADASIA HOAR on 07/31/22 by telephone and verified that I am speaking with the correct person using two identifiers.   I discussed the limitations of evaluation and management by telemedicine. The patient expressed understanding and agreed to proceed.   Patient was previously referred to hospice but declined and would like to be followed by Palliative Care.  Connected with spouse by phone.  Medical History:  Spouse shared patient's medical history with 2 aortic aneurysms one of which is over 7 cm.   Patient was referred to hospice given patient's poor prognosis.  Spouse states patient has good/bad days but did not want to go under hospice services at this time.  They are aware of the severity of this situation and that a ruptured aneurysm will result in death.  Patient would like to take her situation day by day.  Pain has been better managed with the use of oxycodone on a prn basis.  Patient has a potty chair, rolling walker, cane and rollator in the home.  Only using as needed.  Yesterday she could barely walk but today she is doing better and able to be outside working in her flower garden.  Hospice vs Palliative Care:  Discussed differences between services.  Spouse would like to continue with Palliative Care referral.  Visit scheduled with Reina Fuse, SW  on 08/11/22 @ 11 am.      Truitt Merle, RN

## 2022-08-01 ENCOUNTER — Other Ambulatory Visit: Payer: Self-pay | Admitting: Family Medicine

## 2022-08-03 NOTE — Telephone Encounter (Signed)
Requested medication (s) are due for refill today: No  Requested medication (s) are on the active medication list: Yes  Last refill:  07/28/22  Future visit scheduled: Yes  Notes to clinic:  Not delegated.    Requested Prescriptions  Pending Prescriptions Disp Refills   Eszopiclone 3 MG TABS [Pharmacy Med Name: ESZOPICLONE 3MG  TABLETS] 30 tablet     Sig: TAKE 1 TABLET(3 MG) BY MOUTH AT BEDTIME     Not Delegated - Psychiatry:  Anxiolytics/Hypnotics Failed - 08/01/2022  8:15 PM      Failed - This refill cannot be delegated      Failed - Urine Drug Screen completed in last 360 days      Passed - Valid encounter within last 6 months    Recent Outpatient Visits           6 days ago Routine general medical examination at a health care facility   [facility], Megan P, DO   3 weeks ago Decreased pulses in feet   [facility] Larae Grooms, NP   1 month ago Decreased pulses in feet   [facility] [place], [place], DO   3 months ago Primary insomnia   [facility], [place], DO   6 months ago Primary insomnia    [facility] [place], [place], DO       Future Appointments             In 3 months Laural Benes, Oralia Rud, DO  [facility]

## 2022-08-17 DIAGNOSIS — F17200 Nicotine dependence, unspecified, uncomplicated: Secondary | ICD-10-CM | POA: Diagnosis not present

## 2022-08-17 DIAGNOSIS — I10 Essential (primary) hypertension: Secondary | ICD-10-CM | POA: Diagnosis not present

## 2022-08-17 DIAGNOSIS — E0500 Thyrotoxicosis with diffuse goiter without thyrotoxic crisis or storm: Secondary | ICD-10-CM | POA: Diagnosis not present

## 2022-08-18 ENCOUNTER — Ambulatory Visit (INDEPENDENT_AMBULATORY_CARE_PROVIDER_SITE_OTHER): Payer: PPO

## 2022-08-18 VITALS — Ht 63.0 in | Wt 113.0 lb

## 2022-08-18 DIAGNOSIS — Z0000 Encounter for general adult medical examination without abnormal findings: Secondary | ICD-10-CM | POA: Diagnosis not present

## 2022-08-18 NOTE — Progress Notes (Signed)
Subjective:   Jasmine Camacho is a 67 y.o. female who presents for Medicare Annual (Subsequent) preventive examination.  Visit Complete: Virtual  I connected with  Jasmine Camacho on 08/18/22 by a audio enabled telemedicine application and verified that I am speaking with the correct person using two identifiers.  Patient Location: Home  Provider Location: Office/Clinic  I discussed the limitations of evaluation and management by telemedicine. The patient expressed understanding and agreed to proceed.   Review of Systems     Cardiac Risk Factors include: advanced age (>82men, >45 women);hypertension;dyslipidemia;sedentary lifestyle     Objective:    Today's Vitals   08/18/22 0929 08/18/22 0947  Weight:  113 lb (51.3 kg)  Height:  5\' 3"  (1.6 m)  PainSc: 4     Body mass index is 20.02 kg/m.     08/18/2022    9:36 AM 07/08/2022   10:00 AM 11/18/2018   11:10 AM 11/17/2018    1:09 PM  Advanced Directives  Does Patient Have a Medical Advance Directive? No No  No  Would patient like information on creating a medical advance directive? No - Patient declined No - Patient declined No - Patient declined     Current Medications (verified) Outpatient Encounter Medications as of 08/18/2022  Medication Sig   acetaminophen (TYLENOL) 650 MG CR tablet Take 650 mg by mouth every 8 (eight) hours as needed (pain or fever).   albuterol (VENTOLIN HFA) 108 (90 Base) MCG/ACT inhaler INL 2 PFS PO Q 4 TO 6 H PRN   Eszopiclone 3 MG TABS Take 1 tablet (3 mg total) by mouth at bedtime. TAKE 1 TABLET BY MOUTH IMMEDIATELY BEFORE BEDTIME. MUST LAST 30 DAYS   fluticasone-salmeterol (ADVAIR) 100-50 MCG/ACT AEPB Inhale 1 puff into the lungs 2 (two) times daily.   furosemide (LASIX) 20 MG tablet TAKE 2 TABLETS(40 MG) BY MOUTH DAILY   losartan (COZAAR) 100 MG tablet Take 1 tablet (100 mg total) by mouth daily.   metoprolol succinate (TOPROL-XL) 25 MG 24 hr tablet Take 0.5 tablets (12.5 mg total) by mouth at  bedtime.   montelukast (SINGULAIR) 10 MG tablet Take 1 tablet (10 mg total) by mouth at bedtime as needed (for allergies and stuffiness). TAKE 1 TABLET(10 MG) BY MOUTH AT BEDTIME   omeprazole (PRILOSEC) 20 MG capsule TAKE 1 CAPSULE(20 MG) BY MOUTH DAILY   ondansetron (ZOFRAN) 4 MG tablet Take 1 tablet (4 mg total) by mouth every 8 (eight) hours as needed.   oxyCODONE-acetaminophen (PERCOCET/ROXICET) 5-325 MG tablet Take 1 tablet by mouth every 8 (eight) hours as needed for severe pain.   pravastatin (PRAVACHOL) 20 MG tablet Take 1 tablet (20 mg total) by mouth every Monday, Wednesday, and Friday.   cephALEXin (KEFLEX) 500 MG capsule Take 1 capsule (500 mg total) by mouth 4 (four) times daily. (Patient not taking: Reported on 08/18/2022)   methimazole (TAPAZOLE) 5 MG tablet Take 5 mg by mouth daily.  (Patient not taking: Reported on 08/18/2022)   No facility-administered encounter medications on file as of 08/18/2022.    Allergies (verified) Ambien [zolpidem], Asa [aspirin], Benazepril hcl, Carvedilol, Codeine sulfate, Metoprolol tartrate, Other, Aldactone [spironolactone], and Hctz [hydrochlorothiazide]   History: Past Medical History:  Diagnosis Date   Acute respiratory failure (HCC) 11/17/2018   Aphasia    Hypertension    Insomnia    Menopausal symptom    Menorrhagia    Pneumonia    Seasonal allergies    Tachycardia    Thyroid disease  Ventricular premature contractions    Past Surgical History:  Procedure Laterality Date   aneurysm surgery  09/2003   DILATION AND CURETTAGE OF UTERUS  2007   LOWER EXTREMITY ANGIOGRAPHY Left 07/10/2022   Procedure: Lower Extremity Angiography;  Surgeon: Annice Needy, MD;  Location: [facility] INV[facility]IVE [facility];  Service: Cardiovascular;  Laterality: Left;   Family History  Problem Relation Age of Onset   Stroke Mother    Diabetes Mother    Heart disease Mother    Hypertension Mother    Alcohol abuse Father    Cancer Father        liver   Diabetes  Brother    Hypertension Brother    Heart disease Brother    Cancer Brother        colon   Social History   Socioeconomic History   Marital status: Married    Spouse name: Not on file   Number of children: Not on file   Years of education: Not on file   Highest education level: Not on file  Occupational History   Not on file  Tobacco Use   Smoking status: Every Day    Packs/day: .25    Types: Cigarettes   Smokeless tobacco: Never  Vaping Use   Vaping Use: Never used  Substance and Sexual Activity   Alcohol use: No    Alcohol/week: 0.0 standard drinks of alcohol   Drug use: No   Sexual activity: Never  Other Topics Concern   Not on file  Social History Narrative   Not on file   Social Determinants of Health   Financial Resource Strain: Low Risk  (08/18/2022)   Overall Financial Resource Strain (CARDIA)    Difficulty of Paying Living Expenses: Not hard at all  Food Insecurity: No Food Insecurity (08/18/2022)   Hunger Vital Sign    Worried About Running Out of Food in the Last Year: Never true    Ran Out of Food in the Last Year: Never true  Transportation Needs: No Transportation Needs (08/18/2022)   PRAPARE - Administrator, Civil Service (Medical): No    Lack of Transportation (Non-Medical): No  Physical Activity: Inactive (08/18/2022)   Exercise Vital Sign    Days of Exercise per Week: 0 days    Minutes of Exercise per Session: 0 min  Stress: No Stress Concern Present (08/18/2022)   [employer] of Occupational Health - Occupational Stress Questionnaire    Feeling of Stress : Not at all  Social Connections: Unknown (08/18/2022)   Social Connection and Isolation Panel [NHANES]    Frequency of Communication with Friends and Family: Once a week    Frequency of Social Gatherings with Friends and Family: Not on file    Attends Religious Services: More than 4 times per year    Active Member of [employer] or Organizations: No    Attends Hospital doctor: Never    Marital Status: Married    Tobacco Counseling Ready to quit: Not Answered Counseling given: Not Answered   Clinical Intake:  Pre-visit preparation completed: Yes  Pain : 0-10 Pain Score: 4  Pain Type: Chronic pain Pain Location: Foot Pain Orientation: Right, Left Pain Relieving Factors: tramadol  Pain Relieving Factors: tramadol  Nutritional Risks: None Diabetes: No  How often do you need to have someone help you when you read instructions, pamphlets, or other written materials from your doctor or pharmacy?: 1 - Never  Interpreter Needed?: No  Information entered  by :: Kennedy Bucker, LPN   Activities of Daily Living    08/18/2022    9:37 AM 07/08/2022   10:00 AM  In your present state of health, do you have any difficulty performing the following activities:  Hearing? 0 0  Vision? 0 0  Difficulty concentrating or making decisions? 0 0  Walking or climbing stairs? 1 0  Comment feet   Dressing or bathing? 0 0  Doing errands, shopping? 0 0  Preparing Food and eating ? N   Using the Toilet? N   In the past six months, have you accidently leaked urine? N   Do you have problems with loss of bowel control? N   Managing your Medications? N   Managing your Finances? N   Housekeeping or managing your Housekeeping? N     Patient Care Team: Dorcas Carrow, DO as PCP - General (Family Medicine)  Indicate any recent Medical Services you may have received from other than [facility] providers in the past year (date may be approximate).     Assessment:   This is a routine wellness examination for Hae.  Hearing/Vision screen Hearing Screening - Comments:: No aids Vision Screening - Comments:: Wears glasses- Dr.Woodard  Dietary issues and exercise activities discussed:     Goals Addressed             This Visit's Progress    DIET - EAT MORE FRUITS AND VEGETABLES         Depression Screen    08/18/2022    9:34 AM 07/28/2022    9:52 AM  07/07/2022    4:26 PM 06/23/2022   10:46 AM 04/24/2022   10:50 AM 01/23/2022   10:43 AM 11/27/2021   11:03 AM  PHQ 2/9 Scores  PHQ - 2 Score 0 1 0 1 0 0 1  PHQ- 9 Score 0 2 3 2  0 4 7    Fall Risk    08/18/2022    9:36 AM 07/28/2022    9:53 AM 07/07/2022    4:26 PM 06/23/2022   10:45 AM 08/08/2021   10:35 AM  Fall Risk   Falls in the past year? 0 0 0 0 0  Number falls in past yr: 0 0 0 0 0  Injury with Fall? 0 0 0 0 0  Risk for fall due to : No Fall Risks No Fall Risks No Fall Risks No Fall Risks No Fall Risks  Follow up Falls prevention discussed;Falls evaluation completed Falls evaluation completed Falls evaluation completed Falls evaluation completed Falls evaluation completed    MEDICARE RISK AT HOME:  Medicare Risk at Home - 08/18/22 0937     Any stairs in or around the home? Yes    If so, are there any without handrails? No    Home free of loose throw rugs in walkways, pet beds, electrical cords, etc? Yes    Adequate lighting in your home to reduce risk of falls? Yes    Life alert? No    Use of a cane, walker or w/c? Yes   walker w/ seat on it   Grab bars in the bathroom? No    Shower chair or bench in shower? Yes    Elevated toilet seat or a handicapped toilet? No             TIMED UP AND GO:  Was the test performed?  No    Cognitive Function:    10/28/2021   11:37 AM  MMSE -  Mini Mental State Exam  Orientation to time 5  Orientation to Place 5  Registration 3  Attention/ Calculation 5  Recall 2  Language- name 2 objects 2  Language- repeat 1  Language- follow 3 step command 3  Language- read & follow direction 1  Write a sentence 1  Copy design 1  Total score 29        08/18/2022    9:38 AM 08/08/2021   10:39 AM  6CIT Screen  What Year? 0 points 0 points  What month? 0 points 0 points  What time? 0 points 0 points  Count back from 20 0 points 0 points  Months in reverse 4 points 4 points  Repeat phrase 2 points 0 points  Total Score 6 points 4  points    Immunizations Immunization History  Administered Date(s) Administered   Fluad Quad(high Dose 65+) 11/07/2020, 11/27/2021   Influenza,inj,Quad PF,6+ Mos 01/24/2019, 11/09/2019   PFIZER(Purple Top)SARS-COV-2 Vaccination 03/25/2019, 04/15/2019   PNEUMOCOCCAL CONJUGATE-20 08/08/2021   Pneumococcal Polysaccharide-23 08/21/2019   Pneumococcal-Unspecified 10/09/2005   Tdap 10/24/2013    TDAP status: Up to date  Flu Vaccine status: Up to date  Pneumococcal vaccine status: Up to date  Covid-19 vaccine status: Completed vaccines  Qualifies for Shingles Vaccine? Yes   Zostavax completed No   Shingrix Completed?: No.    Education has been provided regarding the importance of this vaccine. Patient has been advised to call insurance company to determine out of pocket expense if they have not yet received this vaccine. Advised may also receive vaccine at local pharmacy or Health Dept. Verbalized acceptance and understanding.  Screening Tests Health Maintenance  Topic Date Due   DEXA SCAN  Never done   COVID-19 Vaccine (3 - 2023-24 season) 10/10/2021   Zoster Vaccines- Shingrix (1 of 2) 10/07/2022 (Originally 05/06/2006)   MAMMOGRAM  06/23/2023 (Originally 05/06/2006)   Colonoscopy  06/23/2023 (Originally 05/05/2001)   INFLUENZA VACCINE  09/10/2022   DTaP/Tdap/Td (2 - Td or Tdap) 10/25/2023   Pneumonia Vaccine 24+ Years old  Completed   Hepatitis C Screening  Completed   HPV VACCINES  Aged Out   Lung Cancer Screening  Discontinued    Health Maintenance  Health Maintenance Due  Topic Date Due   DEXA SCAN  Never done   COVID-19 Vaccine (3 - 2023-24 season) 10/10/2021    Declined referrals for colonoscopy, mammogram, BDS   Lung Cancer Screening: (Low Dose CT Chest recommended if Age 1-80 years, 20 pack-year currently smoking OR have quit w/in 15years.) does qualify.   Lung Cancer Screening Referral: CT done 07/22/21  Additional Screening:  Hepatitis C Screening: does  qualify; Completed 04/16/15  Vision Screening: Recommended annual ophthalmology exams for early detection of glaucoma and other disorders of the eye. Is the patient up to date with their annual eye exam?  Yes  Who is the provider or what is the name of the office in which the patient attends annual eye exams? Dr.Woodard If pt is not established with a provider, would they like to be referred to a provider to establish care? No .   Dental Screening: Recommended annual dental exams for proper oral hygiene   Community Resource Referral / Chronic Care Management: CRR required this visit?  Yes   CCM required this visit?  No     Plan:     I have personally reviewed and noted the following in the patient's chart:   Medical and social history Use of alcohol, tobacco or  illicit drugs  Current medications and supplements including opioid prescriptions. Patient is currently taking opioid prescriptions. Information provided to patient regarding non-opioid alternatives. Patient advised to discuss non-opioid treatment plan with their provider. Functional ability and status Nutritional status Physical activity Advanced directives List of other physicians Hospitalizations, surgeries, and ER visits in previous 12 months Vitals Screenings to include cognitive, depression, and falls Referrals and appointments  In addition, I have reviewed and discussed with patient certain preventive protocols, quality metrics, and best practice recommendations. A written personalized care plan for preventive services as well as general preventive health recommendations were provided to patient.     Hal Hope, LPN   4/0/9811   After Visit Summary: (MyChart) Due to this being a telephonic visit, the after visit summary with patients personalized plan was offered to patient via MyChart   Nurse Notes: none

## 2022-08-18 NOTE — Patient Instructions (Signed)
Jasmine Camacho , Thank you for taking time to come for your Medicare Wellness Visit. I appreciate your ongoing commitment to your health goals. Please review the following plan we discussed and let me know if I can assist you in the future.   These are the goals we discussed:  Goals      DIET - EAT MORE FRUITS AND VEGETABLES        This is a list of the screening recommended for you and due dates:  Health Maintenance  Topic Date Due   DEXA scan (bone density measurement)  Never done   COVID-19 Vaccine (3 - 2023-24 season) 10/10/2021   Zoster (Shingles) Vaccine (1 of 2) 10/07/2022*   Mammogram  06/23/2023*   Colon Cancer Screening  06/23/2023*   Flu Shot  09/10/2022   DTaP/Tdap/Td vaccine (2 - Td or Tdap) 10/25/2023   Pneumonia Vaccine  Completed   Hepatitis C Screening  Completed   HPV Vaccine  Aged Out   Screening for Lung Cancer  Discontinued  *Topic was postponed. The date shown is not the original due date.    Advanced directives: no  Conditions/risks identified: none  Next appointment: Follow up in one year for your annual wellness visit 08/23/23 @ 3:30 pm by phone   Preventive Care 65 Years and Older, Female Preventive care refers to lifestyle choices and visits with your health care provider that can promote health and wellness. What does preventive care include? A yearly physical exam. This is also called an annual well check. Dental exams once or twice a year. Routine eye exams. Ask your health care provider how often you should have your eyes checked. Personal lifestyle choices, including: Daily care of your teeth and gums. Regular physical activity. Eating a healthy diet. Avoiding tobacco and drug use. Limiting alcohol use. Practicing safe sex. Taking low-dose aspirin every day. Taking vitamin and mineral supplements as recommended by your health care provider. What happens during an annual well check? The services and screenings done by your health care  provider during your annual well check will depend on your age, overall health, lifestyle risk factors, and family history of disease. Counseling  Your health care provider may ask you questions about your: Alcohol use. Tobacco use. Drug use. Emotional well-being. Home and relationship well-being. Sexual activity. Eating habits. History of falls. Memory and ability to understand (cognition). Work and work Astronomer. Reproductive health. Screening  You may have the following tests or measurements: Height, weight, and BMI. Blood pressure. Lipid and cholesterol levels. These may be checked every 5 years, or more frequently if you are over 71 years old. Skin check. Lung cancer screening. You may have this screening every year starting at age 39 if you have a 30-pack-year history of smoking and currently smoke or have quit within the past 15 years. Fecal occult blood test (FOBT) of the stool. You may have this test every year starting at age 6. Flexible sigmoidoscopy or colonoscopy. You may have a sigmoidoscopy every 5 years or a colonoscopy every 10 years starting at age 64. Hepatitis C blood test. Hepatitis B blood test. Sexually transmitted disease (STD) testing. Diabetes screening. This is done by checking your blood sugar (glucose) after you have not eaten for a while (fasting). You may have this done every 1-3 years. Bone density scan. This is done to screen for osteoporosis. You may have this done starting at age 1. Mammogram. This may be done every 1-2 years. Talk to your health care provider  about how often you should have regular mammograms. Talk with your health care provider about your test results, treatment options, and if necessary, the need for more tests. Vaccines  Your health care provider may recommend certain vaccines, such as: Influenza vaccine. This is recommended every year. Tetanus, diphtheria, and acellular pertussis (Tdap, Td) vaccine. You may need a Td  booster every 10 years. Zoster vaccine. You may need this after age 13. Pneumococcal 13-valent conjugate (PCV13) vaccine. One dose is recommended after age 76. Pneumococcal polysaccharide (PPSV23) vaccine. One dose is recommended after age 73. Talk to your health care provider about which screenings and vaccines you need and how often you need them. This information is not intended to replace advice given to you by your health care provider. Make sure you discuss any questions you have with your health care provider. Document Released: 02/22/2015 Document Revised: 10/16/2015 Document Reviewed: 11/27/2014 Elsevier Interactive Patient Education  2017 [employer].  Fall Prevention in the Home Falls can cause injuries. They can happen to people of all ages. There are many things you can do to make your home safe and to help prevent falls. What can I do on the outside of my home? Regularly fix the edges of walkways and driveways and fix any cracks. Remove anything that might make you trip as you walk through a door, such as a raised step or threshold. Trim any bushes or trees on the path to your home. Use bright outdoor lighting. Clear any walking paths of anything that might make someone trip, such as rocks or tools. Regularly check to see if handrails are loose or broken. Make sure that both sides of any steps have handrails. Any raised decks and porches should have guardrails on the edges. Have any leaves, snow, or ice cleared regularly. Use sand or salt on walking paths during winter. Clean up any spills in your garage right away. This includes oil or grease spills. What can I do in the bathroom? Use night lights. Install grab bars by the toilet and in the tub and shower. Do not use towel bars as grab bars. Use non-skid mats or decals in the tub or shower. If you need to sit down in the shower, use a plastic, non-slip stool. Keep the floor dry. Clean up any water that spills on the floor  as soon as it happens. Remove soap buildup in the tub or shower regularly. Attach bath mats securely with double-sided non-slip rug tape. Do not have throw rugs and other things on the floor that can make you trip. What can I do in the bedroom? Use night lights. Make sure that you have a light by your bed that is easy to reach. Do not use any sheets or blankets that are too big for your bed. They should not hang down onto the floor. Have a firm chair that has side arms. You can use this for support while you get dressed. Do not have throw rugs and other things on the floor that can make you trip. What can I do in the kitchen? Clean up any spills right away. Avoid walking on wet floors. Keep items that you use a lot in easy-to-reach places. If you need to reach something above you, use a strong step stool that has a grab bar. Keep electrical cords out of the way. Do not use floor polish or wax that makes floors slippery. If you must use wax, use non-skid floor wax. Do not have throw rugs and  other things on the floor that can make you trip. What can I do with my stairs? Do not leave any items on the stairs. Make sure that there are handrails on both sides of the stairs and use them. Fix handrails that are broken or loose. Make sure that handrails are as long as the stairways. Check any carpeting to make sure that it is firmly attached to the stairs. Fix any carpet that is loose or worn. Avoid having throw rugs at the top or bottom of the stairs. If you do have throw rugs, attach them to the floor with carpet tape. Make sure that you have a light switch at the top of the stairs and the bottom of the stairs. If you do not have them, ask someone to add them for you. What else can I do to help prevent falls? Wear shoes that: Do not have high heels. Have rubber bottoms. Are comfortable and fit you well. Are closed at the toe. Do not wear sandals. If you use a stepladder: Make sure that it is  fully opened. Do not climb a closed stepladder. Make sure that both sides of the stepladder are locked into place. Ask someone to hold it for you, if possible. Clearly mark and make sure that you can see: Any grab bars or handrails. First and last steps. Where the edge of each step is. Use tools that help you move around (mobility aids) if they are needed. These include: Canes. Walkers. Scooters. Crutches. Turn on the lights when you go into a dark area. Replace any light bulbs as soon as they burn out. Set up your furniture so you have a clear path. Avoid moving your furniture around. If any of your floors are uneven, fix them. If there are any pets around you, be aware of where they are. Review your medicines with your doctor. Some medicines can make you feel dizzy. This can increase your chance of falling. Ask your doctor what other things that you can do to help prevent falls. This information is not intended to replace advice given to you by your health care provider. Make sure you discuss any questions you have with your health care provider. Document Released: 11/22/2008 Document Revised: 07/04/2015 Document Reviewed: 03/02/2014 Elsevier Interactive Patient Education  2017 [employer].

## 2022-09-02 ENCOUNTER — Telehealth: Payer: Self-pay | Admitting: Family Medicine

## 2022-09-02 NOTE — Telephone Encounter (Signed)
Copied from CRM [id number]. Topic: General - Deceased Patient >> 10/01/2022  9:18 AM Patsy Lager T wrote: Reason for CRM: Bett from [facility] called to get patients death certificate signed. Patient passed away on 10-17-2022 September 24, 2022. Please contact Bett at [phone].  Route to department's PEC Pool.

## 2022-09-02 NOTE — Telephone Encounter (Signed)
Called and informed Bett of Dr.Johnson return Friday.

## 2022-09-10 DEATH — deceased

## 2022-11-03 ENCOUNTER — Ambulatory Visit: Payer: PPO | Admitting: Family Medicine

## 2022-11-22 IMAGING — CT CT ANGIO CHEST-ABD-PELV FOR DISSECTION W/ AND WO/W CM
2 of 7 series · 12 of 46 positions shown, 14 images · IV contrast (APPLIED)
Comparison: 11/17/2018

CLINICAL DATA: Thoracic aorta disease, pre-op planning

EXAM:
CT ANGIOGRAPHY CHEST, ABDOMEN AND PELVIS
TECHNIQUE: Non-contrast CT of the chest was initially obtained.

[Series 6: axial arterial · axial · arterial · 0.73mm/px · z∈[-746,-230]mm · 9 of 202 slices shown, 11 images]
[im 15/202  soft-tissue]
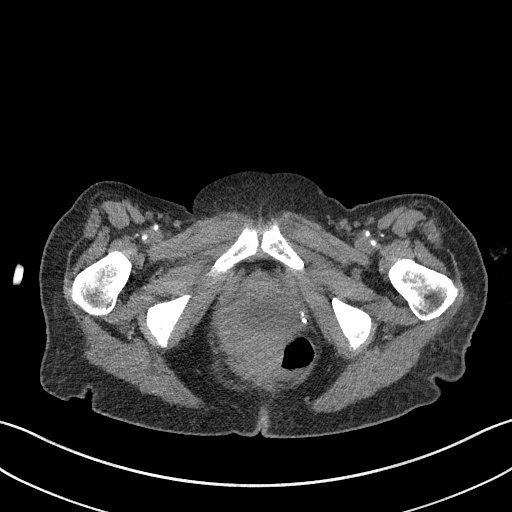
[im 15/202  bone]
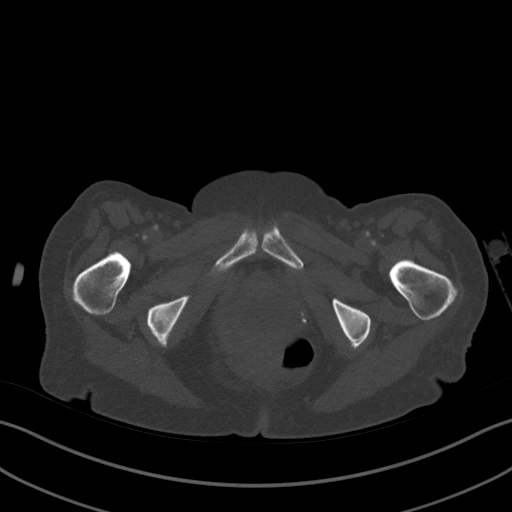
[im 44/202  soft-tissue]
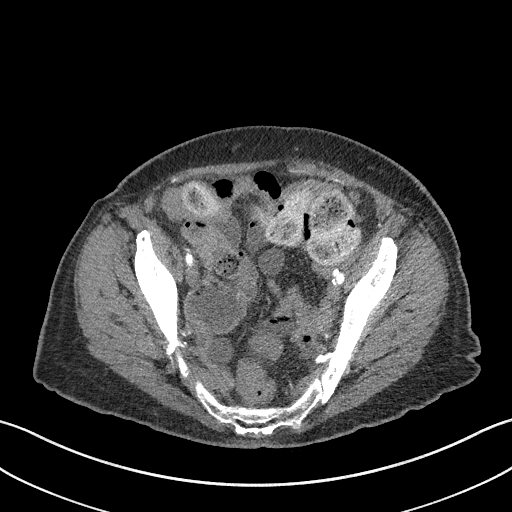
[im 58/202  soft-tissue]
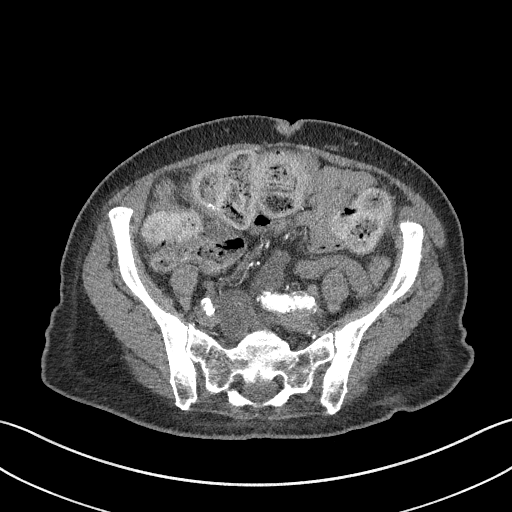
[im 87/202  soft-tissue]
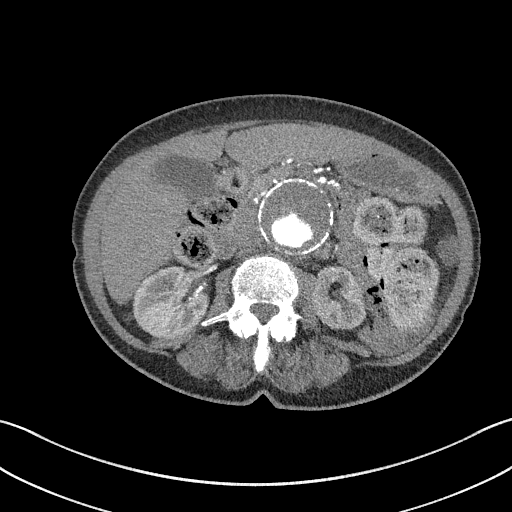
[im 101/202  soft-tissue]
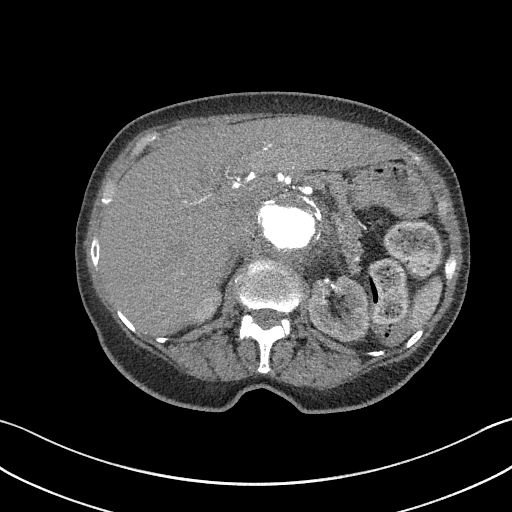
[im 115/202  soft-tissue]
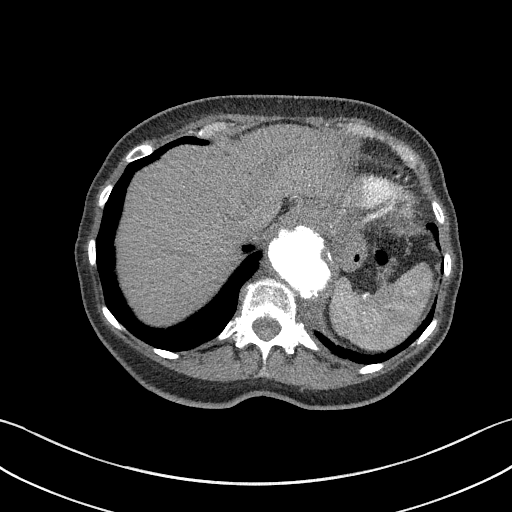
[im 144/202  soft-tissue]
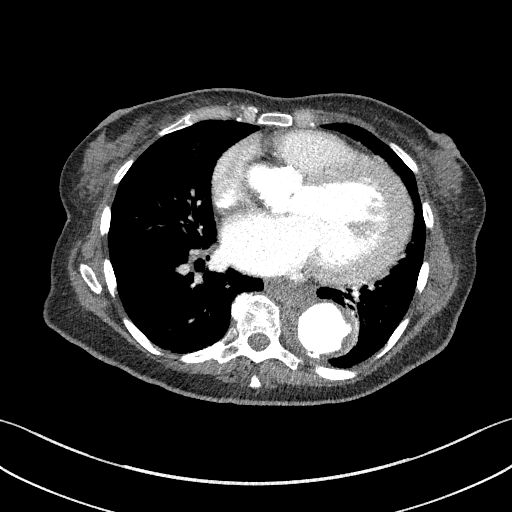
[im 158/202  soft-tissue]
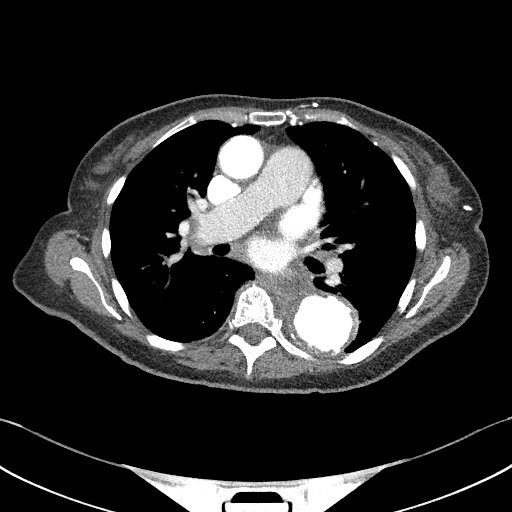
[im 187/202  soft-tissue]
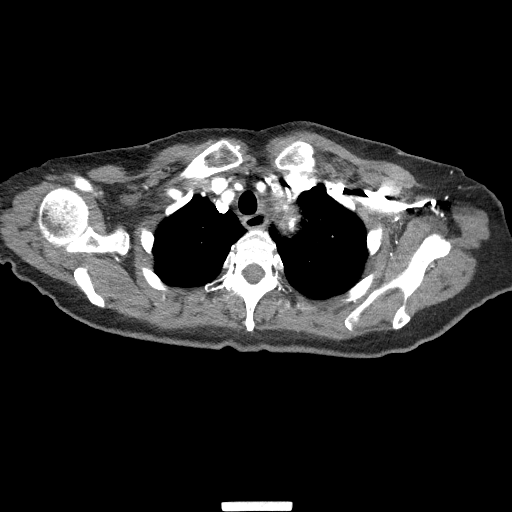
[im 187/202  bone]
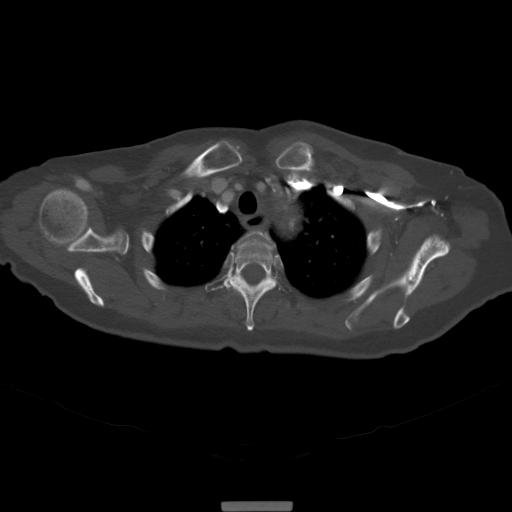

[Series 9: coronals · coronal · 0.65mm/px · 3 of 129 slices shown]
[im 33/129  soft-tissue]
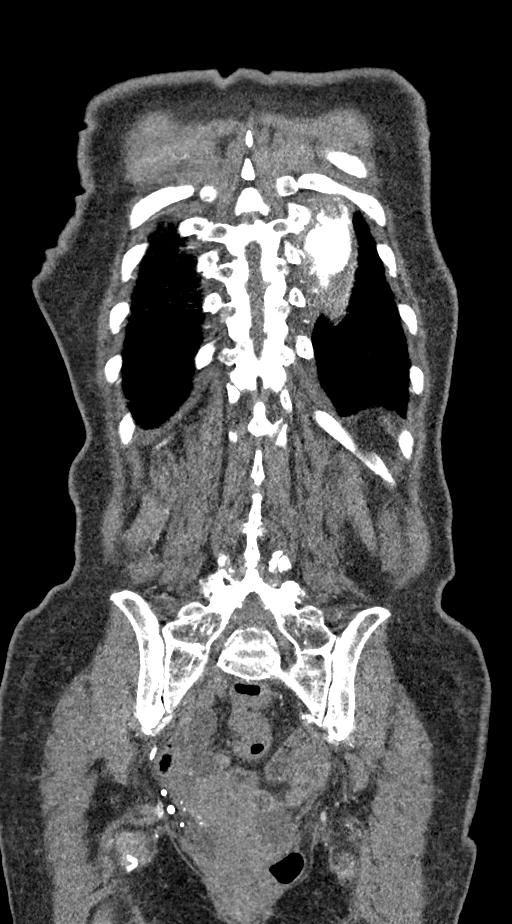
[im 65/129  soft-tissue]
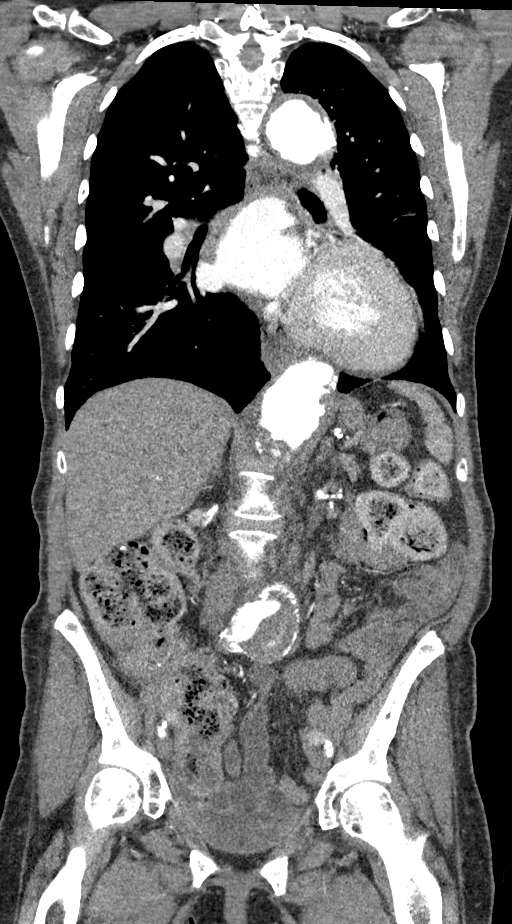
[im 97/129  soft-tissue]
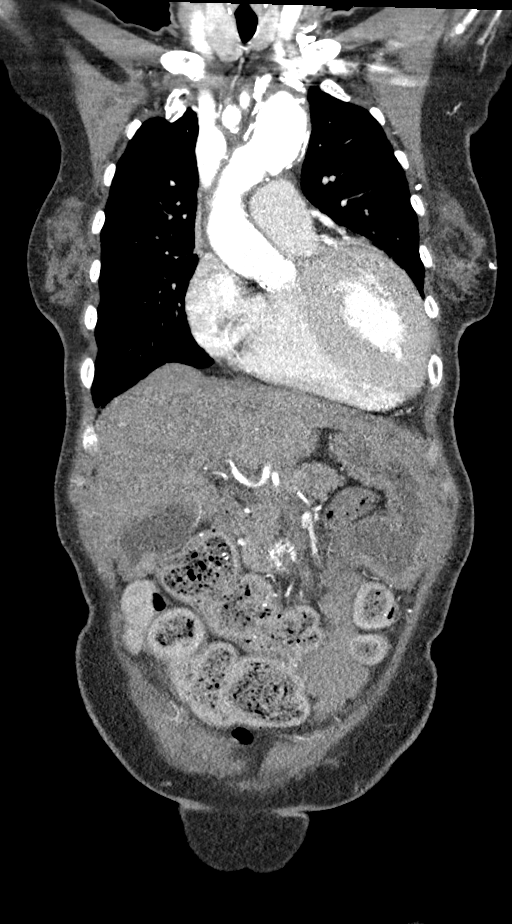

[12 of 46 positions shown; findings below may reference images not displayed]

Multidetector CT imaging through the chest, abdomen and pelvis was
performed using the standard protocol during bolus administration of
intravenous contrast. Multiplanar reconstructed images and MIPs were
obtained and reviewed to evaluate the vascular anatomy.

RADIATION DOSE REDUCTION: This exam was performed according to the
departmental dose-optimization program which includes automated
exposure control, adjustment of the mA and/or kV according to
patient size and/or use of iterative reconstruction technique.

CONTRAST:  100mL OMNIPAQUE IOHEXOL 350 MG/ML SOLN
FINDINGS: CTA CHEST FINDINGS

Cardiovascular: Four-chamber cardiac enlargement. No pericardial
effusion. Fair contrast opacification of the pulmonary arterial tree
with no filling defects to suggest acute PE. No left atrial
thrombus. Aortic valve leaflet coarse calcifications. Extensive
coronary calcifications. Good contrast opacification of the thoracic
aorta without dissection or stenosis. Classic 3 vessel
brachiocephalic arterial origin anatomy with calcified plaque
resulting in mild stenosis of all 3 vessels.

Aortic Root:

--Valve: 2.6 cm

--Sinuses: 3.5 cm

--Sinotubular Junction: 2.8 cm

Limitations by motion: Mild

Thoracic Aorta:

--Ascending Aorta: 3.3 cm

--Aortic Arch: 3.4 cm

--Descending Aorta: Isthmus 4 cm, 4.8 cm at the level of the carina
(previously 3.8), 4.3 cm above the diaphragm. There is extensive
irregular nonocclusive atheromatous thrombus through the aneurysmal
descending aorta, with possible embolic risk.

Mediastinum/Nodes: 1.5 cm precarinal node, previously 1.9 cm. No
hilar adenopathy. No mass or hematoma.

Lungs/Pleura: No pleural effusion. No pneumothorax. Geographic
ground-glass opacities in the lung bases with septal prominence
suggesting mild interstitial edema. Pulmonary emphysematous changes
most marked in the apices.

Musculoskeletal: No chest wall abnormality. No acute or significant
osseous findings.

Review of the MIP images confirms the above findings.

CTA ABDOMEN AND PELVIS FINDINGS

VASCULAR

Aorta: Supra celiac segment 4.5 cm, juxtarenal 4.9 cm, infrarenal
fusiform aneurysm up to 6.2 x 5.9 cm, tapering to 2.8 cm just above
the bifurcation. There is extensive nonocclusive mural thrombus
particularly in the infrarenal segment. No evidence of leak or
impending rupture.

Celiac: High-grade ostial stenosis over length of approximately
cm, patent distally with classic branch anatomy.

SMA: Mild short-segment origin stenosis of doubtful hemodynamic
significance, patent distally with classic distal branch anatomy.

Renals: Single bilaterally, both with short-segment ostial stenoses
of doubtful hemodynamic significance, patent distally.

IMA: Apparent origin occlusion, distal branches perfused by
collaterals.

Inflow: 1.8 cm right common iliac artery aneurysm. Tandem stenoses
in the proximal and mid right external iliac, of probable
hemodynamic significance.

On the left, 1.8 cm dilatation of common iliac. Short-segment
stenosis of the mid external iliac of probable hemodynamic
significance.

Veins: No obvious venous abnormality within the limitations of this
arterial phase study.

Review of the MIP images confirms the above findings.

NON-VASCULAR

Hepatobiliary: No focal liver abnormality is seen. No gallstones,
gallbladder wall thickening, or biliary dilatation.

Pancreas: Unremarkable. No pancreatic ductal dilatation or
surrounding inflammatory changes.

Spleen: Normal in size without focal abnormality.

Adrenals/Urinary Tract: Adrenal glands are unremarkable. Kidneys are
normal, without renal calculi, focal lesion, or hydronephrosis.
Bladder is unremarkable.

Stomach/Bowel: Stomach and small bowel are nondistended. Moderate
proximal colonic fecal material, sigmoid colon and rectum relatively
decompressed.

Lymphatic: No abdominal or pelvic adenopathy.

Reproductive: Uterus and bilateral adnexa are unremarkable.

Other: Bilateral pelvic phleboliths.  No ascites.  No free air.

Musculoskeletal: Advanced facet DJD lower lumbar spine, probably
accounting for the grade 1 all anterolisthesis L5-S1.

Review of the MIP images confirms the above findings.
IMPRESSION: 1. Thoracoabdominal aneurysm measuring 4.8 cm mid descending
(previously 3.8), 6.2 cm infrarenal. Current guidelines recommend
referral to a vascular specialist(note: the patient is currently
under the care of a vascular specialist/surgeon).
2. Osseous stenoses of celiac axis and bilateral renal arteries
3. Bilateral 1.8 cm common iliac artery aneurysms.
4. Bilateral external iliac artery stenoses of probable hemodynamic
significance.
